# Patient Record
Sex: Female | Born: 1990 | Race: White | Hispanic: No | Marital: Single | Smoking: Current every day smoker
Health system: Southern US, Community
[De-identification: ages and names within clinical notes are randomized; demographics above are authoritative.]

## PROBLEM LIST (undated history)

## (undated) ENCOUNTER — Inpatient Hospital Stay (HOSPITAL_COMMUNITY): Payer: Self-pay

## (undated) DIAGNOSIS — R51 Headache: Secondary | ICD-10-CM

## (undated) DIAGNOSIS — N200 Calculus of kidney: Secondary | ICD-10-CM

## (undated) DIAGNOSIS — IMO0002 Reserved for concepts with insufficient information to code with codable children: Secondary | ICD-10-CM

## (undated) DIAGNOSIS — G5603 Carpal tunnel syndrome, bilateral upper limbs: Secondary | ICD-10-CM

## (undated) DIAGNOSIS — E669 Obesity, unspecified: Secondary | ICD-10-CM

## (undated) DIAGNOSIS — N83201 Unspecified ovarian cyst, right side: Secondary | ICD-10-CM

## (undated) DIAGNOSIS — Z3490 Encounter for supervision of normal pregnancy, unspecified, unspecified trimester: Secondary | ICD-10-CM

## (undated) DIAGNOSIS — N390 Urinary tract infection, site not specified: Secondary | ICD-10-CM

## (undated) DIAGNOSIS — K219 Gastro-esophageal reflux disease without esophagitis: Secondary | ICD-10-CM

## (undated) DIAGNOSIS — F32A Depression, unspecified: Secondary | ICD-10-CM

## (undated) DIAGNOSIS — I10 Essential (primary) hypertension: Secondary | ICD-10-CM

## (undated) DIAGNOSIS — F319 Bipolar disorder, unspecified: Secondary | ICD-10-CM

## (undated) DIAGNOSIS — R1031 Right lower quadrant pain: Secondary | ICD-10-CM

## (undated) DIAGNOSIS — R87619 Unspecified abnormal cytological findings in specimens from cervix uteri: Secondary | ICD-10-CM

## (undated) DIAGNOSIS — F329 Major depressive disorder, single episode, unspecified: Secondary | ICD-10-CM

## (undated) DIAGNOSIS — N949 Unspecified condition associated with female genital organs and menstrual cycle: Principal | ICD-10-CM

## (undated) DIAGNOSIS — O139 Gestational [pregnancy-induced] hypertension without significant proteinuria, unspecified trimester: Secondary | ICD-10-CM

## (undated) DIAGNOSIS — R110 Nausea: Secondary | ICD-10-CM

## (undated) DIAGNOSIS — N898 Other specified noninflammatory disorders of vagina: Secondary | ICD-10-CM

## (undated) DIAGNOSIS — N760 Acute vaginitis: Secondary | ICD-10-CM

## (undated) DIAGNOSIS — B9689 Other specified bacterial agents as the cause of diseases classified elsewhere: Secondary | ICD-10-CM

## (undated) HISTORY — DX: Reserved for concepts with insufficient information to code with codable children: IMO0002

## (undated) HISTORY — DX: Other specified noninflammatory disorders of vagina: N89.8

## (undated) HISTORY — DX: Obesity, unspecified: E66.9

## (undated) HISTORY — DX: Unspecified abnormal cytological findings in specimens from cervix uteri: R87.619

## (undated) HISTORY — PX: MYRINGOTOMY: SUR874

## (undated) HISTORY — PX: TONSILLECTOMY: SUR1361

## (undated) HISTORY — DX: Nausea: R11.0

## (undated) HISTORY — DX: Other specified bacterial agents as the cause of diseases classified elsewhere: B96.89

## (undated) HISTORY — DX: Right lower quadrant pain: R10.31

## (undated) HISTORY — PX: TONSILLECTOMY AND ADENOIDECTOMY: SHX28

## (undated) HISTORY — DX: Unspecified ovarian cyst, right side: N83.201

## (undated) HISTORY — DX: Acute vaginitis: N76.0

## (undated) HISTORY — DX: Unspecified condition associated with female genital organs and menstrual cycle: N94.9

---

## 2010-01-25 ENCOUNTER — Emergency Department (HOSPITAL_COMMUNITY): Admission: EM | Admit: 2010-01-25 | Discharge: 2010-01-25 | Payer: Self-pay | Admitting: Emergency Medicine

## 2010-07-20 ENCOUNTER — Emergency Department (HOSPITAL_COMMUNITY)
Admission: EM | Admit: 2010-07-20 | Discharge: 2010-07-20 | Payer: Self-pay | Source: Home / Self Care | Admitting: Emergency Medicine

## 2010-07-23 LAB — URINALYSIS, ROUTINE W REFLEX MICROSCOPIC
Bilirubin Urine: NEGATIVE
Hgb urine dipstick: NEGATIVE
Ketones, ur: NEGATIVE mg/dL
Nitrite: NEGATIVE
Protein, ur: NEGATIVE mg/dL
Specific Gravity, Urine: 1.015 (ref 1.005–1.030)
Urine Glucose, Fasting: NEGATIVE mg/dL
Urobilinogen, UA: 0.2 mg/dL (ref 0.0–1.0)
pH: 6 (ref 5.0–8.0)

## 2010-07-23 LAB — POCT PREGNANCY, URINE: Preg Test, Ur: NEGATIVE

## 2010-09-15 LAB — COMPREHENSIVE METABOLIC PANEL
ALT: 22 U/L (ref 0–35)
AST: 28 U/L (ref 0–37)
Albumin: 4.7 g/dL (ref 3.5–5.2)
Alkaline Phosphatase: 55 U/L (ref 39–117)
BUN: 8 mg/dL (ref 6–23)
CO2: 26 mEq/L (ref 19–32)
Calcium: 9.2 mg/dL (ref 8.4–10.5)
Chloride: 101 mEq/L (ref 96–112)
Creatinine, Ser: 0.77 mg/dL (ref 0.4–1.2)
GFR calc Af Amer: >60 mL/min (ref >60)
GFR calc non Af Amer: >60 mL/min (ref >60)
Glucose, Bld: 98 mg/dL (ref 70–99)
Potassium: 4.1 mEq/L (ref 3.5–5.1)
Sodium: 136 mEq/L (ref 135–145)
Total Bilirubin: 0.5 mg/dL (ref 0.3–1.2)
Total Protein: 7.4 g/dL (ref 6.0–8.3)

## 2010-09-15 LAB — CBC
HCT: 40.7 % (ref 36.0–46.0)
Hemoglobin: 14 g/dL (ref 12.0–15.0)
MCH: 33.5 pg (ref 26.0–34.0)
MCHC: 34.5 g/dL (ref 30.0–36.0)
MCV: 97.1 fL (ref 78.0–100.0)
Platelets: 256 10*3/uL (ref 150–400)
RBC: 4.19 MIL/uL (ref 3.87–5.11)
RDW: 13.2 % (ref 11.5–15.5)
WBC: 10.6 10*3/uL — ABNORMAL HIGH (ref 4.0–10.5)

## 2010-09-15 LAB — DIFFERENTIAL
Basophils Absolute: 0 10*3/uL (ref 0.0–0.1)
Basophils Relative: 0 % (ref 0–1)
Eosinophils Absolute: 0 10*3/uL (ref 0.0–0.7)
Eosinophils Relative: 0 % (ref 0–5)
Lymphocytes Relative: 11 % — ABNORMAL LOW (ref 12–46)
Lymphs Abs: 1.2 10*3/uL (ref 0.7–4.0)
Monocytes Absolute: 0.4 10*3/uL (ref 0.1–1.0)
Monocytes Relative: 4 % (ref 3–12)
Neutro Abs: 8.9 10*3/uL — ABNORMAL HIGH (ref 1.7–7.7)
Neutrophils Relative %: 84 % — ABNORMAL HIGH (ref 43–77)

## 2010-09-15 LAB — URINALYSIS, ROUTINE W REFLEX MICROSCOPIC
Bilirubin Urine: NEGATIVE
Glucose, UA: NEGATIVE mg/dL
Hgb urine dipstick: NEGATIVE
Ketones, ur: NEGATIVE mg/dL
Nitrite: NEGATIVE
Protein, ur: NEGATIVE mg/dL
Specific Gravity, Urine: 1.02 (ref 1.005–1.030)
Urobilinogen, UA: 0.2 mg/dL (ref 0.0–1.0)
pH: 7.5 (ref 5.0–8.0)

## 2010-09-15 LAB — POCT PREGNANCY, URINE: Preg Test, Ur: NEGATIVE

## 2010-09-16 ENCOUNTER — Emergency Department (HOSPITAL_COMMUNITY)
Admission: EM | Admit: 2010-09-16 | Discharge: 2010-09-16 | Disposition: A | Discharge status: Home / Self Care | Payer: Medicaid Other | Attending: Emergency Medicine | Admitting: Emergency Medicine

## 2010-09-16 DIAGNOSIS — R209 Unspecified disturbances of skin sensation: Secondary | ICD-10-CM | POA: Insufficient documentation

## 2010-09-16 DIAGNOSIS — G609 Hereditary and idiopathic neuropathy, unspecified: Secondary | ICD-10-CM | POA: Insufficient documentation

## 2010-09-16 LAB — GLUCOSE, CAPILLARY: Glucose-Capillary: 80 mg/dL (ref 70–99)

## 2010-10-19 ENCOUNTER — Emergency Department (HOSPITAL_COMMUNITY)
Admission: EM | Admit: 2010-10-19 | Discharge: 2010-10-19 | Disposition: A | Discharge status: Home / Self Care | Payer: Medicaid Other | Attending: Emergency Medicine | Admitting: Emergency Medicine

## 2010-10-19 DIAGNOSIS — N72 Inflammatory disease of cervix uteri: Secondary | ICD-10-CM | POA: Insufficient documentation

## 2010-10-19 DIAGNOSIS — R300 Dysuria: Secondary | ICD-10-CM | POA: Insufficient documentation

## 2010-10-19 LAB — URINALYSIS, ROUTINE W REFLEX MICROSCOPIC
Bilirubin Urine: NEGATIVE
Glucose, UA: NEGATIVE mg/dL
Hgb urine dipstick: NEGATIVE
Nitrite: NEGATIVE
Protein, ur: NEGATIVE mg/dL
Specific Gravity, Urine: 1.02 (ref 1.005–1.030)
Urobilinogen, UA: 0.2 mg/dL (ref 0.0–1.0)
pH: 6.5 (ref 5.0–8.0)

## 2010-10-19 LAB — WET PREP, GENITAL
Trich, Wet Prep: NONE SEEN
Yeast Wet Prep HPF POC: NONE SEEN

## 2010-10-19 LAB — POCT PREGNANCY, URINE: Preg Test, Ur: NEGATIVE

## 2010-10-21 LAB — URINE CULTURE
Colony Count: 7000
Culture  Setup Time: 201204211943

## 2010-10-22 LAB — GC/CHLAMYDIA PROBE AMP, GENITAL
Chlamydia, DNA Probe: NEGATIVE
GC Probe Amp, Genital: NEGATIVE

## 2010-11-13 ENCOUNTER — Emergency Department (HOSPITAL_COMMUNITY)
Admission: EM | Admit: 2010-11-13 | Discharge: 2010-11-13 | Disposition: A | Discharge status: Home / Self Care | Payer: Medicaid Other | Attending: Emergency Medicine | Admitting: Emergency Medicine

## 2010-11-13 DIAGNOSIS — K219 Gastro-esophageal reflux disease without esophagitis: Secondary | ICD-10-CM | POA: Insufficient documentation

## 2010-11-13 DIAGNOSIS — Z79899 Other long term (current) drug therapy: Secondary | ICD-10-CM | POA: Insufficient documentation

## 2010-11-13 DIAGNOSIS — F319 Bipolar disorder, unspecified: Secondary | ICD-10-CM | POA: Insufficient documentation

## 2010-11-13 DIAGNOSIS — H60399 Other infective otitis externa, unspecified ear: Secondary | ICD-10-CM | POA: Insufficient documentation

## 2011-01-18 ENCOUNTER — Emergency Department (HOSPITAL_COMMUNITY)
Admission: EM | Admit: 2011-01-18 | Discharge: 2011-01-18 | Disposition: A | Discharge status: Home / Self Care | Payer: Medicaid Other | Attending: Emergency Medicine | Admitting: Emergency Medicine

## 2011-01-18 DIAGNOSIS — IMO0001 Reserved for inherently not codable concepts without codable children: Secondary | ICD-10-CM | POA: Insufficient documentation

## 2011-01-18 DIAGNOSIS — N898 Other specified noninflammatory disorders of vagina: Secondary | ICD-10-CM | POA: Insufficient documentation

## 2011-01-18 DIAGNOSIS — R5383 Other fatigue: Secondary | ICD-10-CM | POA: Insufficient documentation

## 2011-01-18 DIAGNOSIS — R109 Unspecified abdominal pain: Secondary | ICD-10-CM | POA: Insufficient documentation

## 2011-01-18 DIAGNOSIS — R5381 Other malaise: Secondary | ICD-10-CM | POA: Insufficient documentation

## 2011-01-18 DIAGNOSIS — N899 Noninflammatory disorder of vagina, unspecified: Secondary | ICD-10-CM | POA: Insufficient documentation

## 2011-01-18 DIAGNOSIS — R300 Dysuria: Secondary | ICD-10-CM | POA: Insufficient documentation

## 2011-01-18 DIAGNOSIS — N949 Unspecified condition associated with female genital organs and menstrual cycle: Secondary | ICD-10-CM | POA: Insufficient documentation

## 2011-01-18 LAB — URINALYSIS, ROUTINE W REFLEX MICROSCOPIC
Bilirubin Urine: NEGATIVE
Glucose, UA: NEGATIVE mg/dL
Hgb urine dipstick: NEGATIVE
Ketones, ur: NEGATIVE mg/dL
Nitrite: NEGATIVE
Protein, ur: NEGATIVE mg/dL
Specific Gravity, Urine: 1.015 (ref 1.005–1.030)
Urobilinogen, UA: 0.2 mg/dL (ref 0.0–1.0)
pH: 6 (ref 5.0–8.0)

## 2011-01-18 LAB — URINE MICROSCOPIC-ADD ON

## 2011-01-18 LAB — WET PREP, GENITAL
Clue Cells Wet Prep HPF POC: NONE SEEN
Trich, Wet Prep: NONE SEEN
Yeast Wet Prep HPF POC: NONE SEEN

## 2011-01-18 LAB — PREGNANCY, URINE: Preg Test, Ur: NEGATIVE

## 2011-01-18 MED ORDER — HYDROCODONE-IBUPROFEN 5-200 MG PO TABS: 1.0000 | ORAL_TABLET | Freq: Four times a day (QID) | ORAL | Status: AC | PRN

## 2011-01-18 MED ORDER — HYDROCODONE-ACETAMINOPHEN 5-325 MG PO TABS: 1.0000 | ORAL_TABLET | Freq: Four times a day (QID) | ORAL | Status: DC | PRN

## 2011-01-18 MED ORDER — OXYCODONE-ACETAMINOPHEN 5-325 MG PO TABS: 1.0000 | ORAL_TABLET | ORAL | Status: DC | PRN

## 2011-01-18 MED ORDER — FLUCONAZOLE 100 MG PO TABS
150.0000 mg | ORAL_TABLET | Freq: Once | ORAL | Status: AC
  Administered 2011-01-18: 100 mg via ORAL
  Filled 2011-01-18: qty 1

## 2011-01-18 NOTE — ED Provider Notes (Signed)
History     Chief Complaint  Patient presents with  . Vaginal Itching   HPI Comments: Connie [name] is a 20 y.o. W/F that states she has vaginal itching, burning and has used OTC yeast cream without relief.  Current sex partner x 1 year. Last sexual intercourse 5 days ago. Symptoms started after shaving and after intercourse. Vaginal burning is on urination.    Patient is a 20 y.o. female presenting with vaginal itching. The history is provided by the patient.  Vaginal Itching This is a new problem. The current episode started in the past 7 days. The problem occurs constantly. The problem has been gradually worsening. Associated symptoms include chills, fatigue and myalgias. Pertinent negatives include no abdominal pain, fever, headaches, nausea, neck pain, rash, sore throat, swollen glands or vomiting. The symptoms are aggravated by nothing. The treatment provided moderate relief.    History reviewed. No pertinent past medical history.  History reviewed. No pertinent past surgical history.  History reviewed. No pertinent family history.  History  Substance Use Topics  . Smoking status: Never Smoker   . Smokeless tobacco: Not on file  . Alcohol Use: No    OB History    Grav Para Term Preterm Abortions TAB SAB Ect Mult Living   0               Review of Systems  Constitutional: Positive for chills and fatigue. Negative for fever.  HENT: Negative for sore throat and neck pain.   Eyes: Negative.   Respiratory: Negative.   Gastrointestinal: Negative for nausea, vomiting and abdominal pain.  Genitourinary: Positive for dysuria, frequency, flank pain, vaginal discharge and vaginal pain. Negative for vaginal bleeding and difficulty urinating.  Musculoskeletal: Positive for myalgias.  Skin: Negative for rash.  Neurological: Negative for headaches.  Psychiatric/Behavioral:       Bipolar    Physical Exam  BP 135/76  Pulse 90  Temp(Src) 98.6 F (37 C) (Oral)  Resp 24   Ht 4\' 11"  (1.499 m)  Wt 156 lb (70.761 kg)  BMI 31.51 kg/m2  SpO2 100%  LMP 12/19/2010  Physical Exam  Nursing note and vitals reviewed. Constitutional: She is oriented to person, place, and time. She appears well-developed and well-nourished.  HENT:  Head: Normocephalic and atraumatic.  Eyes: EOM are normal.  Neck: Neck supple.  Pulmonary/Chest: Effort normal.  Abdominal: Soft. There is no tenderness.  Genitourinary: Uterus normal.    Cervix exhibits discharge. Cervix exhibits no motion tenderness. Right adnexum displays no mass and no tenderness. Left adnexum displays no mass and no tenderness. Vaginal discharge found.       There is erythema and minimal edema of the clitoris and the labia. The vaginal discharge is white clumpy. There are no lesions visible.  Musculoskeletal: Normal range of motion.  Neurological: She is alert and oriented to person, place, and time. No cranial nerve deficit.  Skin: Skin is warm and dry.    ED Course  INCISION AND DRAINAGE Performed by: Kerrie Buffalo Authorized by: Lynelle Doctor, IVA L    MDM Connie [name] 19 y.o. with vaginal itching and burning.  Dx: vaginal irritation secondary to shaving. Monilia  Plan: Patient will continue to use yeast cream and we will give diflucan here today. If she develops and lesions she will return. Discussed slight possibility of HSV, but no lesions identified today.       [place], NP 01/18/11 1801  [place], NP 01/19/11 1205

## 2011-01-18 NOTE — ED Notes (Signed)
Pt presents with vaginal itching and pain. Pt states pain increases when she urinates. Pt states she has tried yeast infection medication.

## 2011-01-19 LAB — GC/CHLAMYDIA PROBE AMP, GENITAL
Chlamydia, DNA Probe: NEGATIVE
GC Probe Amp, Genital: NEGATIVE

## 2011-01-19 NOTE — ED Provider Notes (Signed)
History     Chief Complaint  Patient presents with  . Vaginal Itching   HPI  History reviewed. No pertinent past medical history.  History reviewed. No pertinent past surgical history.  History reviewed. No pertinent family history.  History  Substance Use Topics  . Smoking status: Never Smoker   . Smokeless tobacco: Not on file  . Alcohol Use: No    OB History    Grav Para Term Preterm Abortions TAB SAB Ect Mult Living   0               Review of Systems  Physical Exam  BP 135/76  Pulse 90  Temp(Src) 98.6 F (37 C) (Oral)  Resp 24  Ht 4\' 11"  (1.499 m)  Wt 156 lb (70.761 kg)  BMI 31.51 kg/m2  SpO2 100%  LMP 12/19/2010  Physical Exam  ED Course  Procedures  MDM  Medical screening examination/treatment/procedure(s) were performed by non-physician practitioner and as supervising physician I was immediately available for consultation/collaboration. Devoria Albe, MD, FACEP       [name], MD 01/19/11 [phone]

## 2011-01-19 NOTE — ED Provider Notes (Signed)
Medical screening examination/treatment/procedure(s) were performed by non-physician practitioner and as supervising physician I was immediately available for consultation/collaboration. Devoria Albe, MD, FACEP   [name], MD 01/19/11 [phone]

## 2011-02-19 ENCOUNTER — Other Ambulatory Visit: Payer: Self-pay | Admitting: Nurse Practitioner

## 2011-02-19 ENCOUNTER — Other Ambulatory Visit (HOSPITAL_COMMUNITY)
Admission: RE | Admit: 2011-02-19 | Discharge: 2011-02-19 | Disposition: A | Discharge status: Home / Self Care | Payer: Medicaid Other | Source: Ambulatory Visit | Attending: Nurse Practitioner | Admitting: Nurse Practitioner

## 2011-02-19 ENCOUNTER — Other Ambulatory Visit (HOSPITAL_COMMUNITY)
Admission: RE | Admit: 2011-02-19 | Discharge: 2011-02-19 | Disposition: A | Discharge status: Home / Self Care | Payer: Medicaid Other | Source: Ambulatory Visit | Attending: Family Medicine | Admitting: Family Medicine

## 2011-02-19 DIAGNOSIS — R87612 Low grade squamous intraepithelial lesion on cytologic smear of cervix (LGSIL): Secondary | ICD-10-CM | POA: Insufficient documentation

## 2011-02-19 DIAGNOSIS — N870 Mild cervical dysplasia: Secondary | ICD-10-CM | POA: Insufficient documentation

## 2011-03-27 ENCOUNTER — Encounter (HOSPITAL_COMMUNITY): Payer: Self-pay | Admitting: *Deleted

## 2011-03-27 ENCOUNTER — Emergency Department (HOSPITAL_COMMUNITY): Payer: Medicaid Other

## 2011-03-27 ENCOUNTER — Emergency Department (HOSPITAL_COMMUNITY)
Admission: EM | Admit: 2011-03-27 | Discharge: 2011-03-28 | Disposition: A | Discharge status: Home / Self Care | Payer: Medicaid Other | Attending: Emergency Medicine | Admitting: Emergency Medicine

## 2011-03-27 DIAGNOSIS — R0789 Other chest pain: Secondary | ICD-10-CM

## 2011-03-27 DIAGNOSIS — R079 Chest pain, unspecified: Secondary | ICD-10-CM | POA: Insufficient documentation

## 2011-03-27 DIAGNOSIS — F17200 Nicotine dependence, unspecified, uncomplicated: Secondary | ICD-10-CM | POA: Insufficient documentation

## 2011-03-27 HISTORY — DX: Depression, unspecified: F32.A

## 2011-03-27 HISTORY — DX: Major depressive disorder, single episode, unspecified: F32.9

## 2011-03-27 HISTORY — DX: Bipolar disorder, unspecified: F31.9

## 2011-03-27 HISTORY — DX: Gastro-esophageal reflux disease without esophagitis: K21.9

## 2011-03-27 LAB — COMPREHENSIVE METABOLIC PANEL
ALT: 11 U/L (ref 0–35)
AST: 14 U/L (ref 0–37)
Albumin: 4 g/dL (ref 3.5–5.2)
Alkaline Phosphatase: 54 U/L (ref 39–117)
BUN: 6 mg/dL (ref 6–23)
CO2: 27 mEq/L (ref 19–32)
Calcium: 9.1 mg/dL (ref 8.4–10.5)
Chloride: 100 mEq/L (ref 96–112)
Creatinine, Ser: 0.94 mg/dL (ref 0.50–1.10)
GFR calc Af Amer: >60 mL/min (ref >60)
GFR calc non Af Amer: >60 mL/min (ref >60)
Glucose, Bld: 88 mg/dL (ref 70–99)
Potassium: 4 mEq/L (ref 3.5–5.1)
Sodium: 136 mEq/L (ref 135–145)
Total Bilirubin: 0.1 mg/dL — ABNORMAL LOW (ref 0.3–1.2)
Total Protein: 6.6 g/dL (ref 6.0–8.3)

## 2011-03-27 LAB — DIFFERENTIAL
Basophils Absolute: 0 10*3/uL (ref 0.0–0.1)
Basophils Relative: 0 % (ref 0–1)
Eosinophils Absolute: 0.2 10*3/uL (ref 0.0–0.7)
Eosinophils Relative: 3 % (ref 0–5)
Lymphocytes Relative: 40 % (ref 12–46)
Lymphs Abs: 3.1 10*3/uL (ref 0.7–4.0)
Monocytes Absolute: 0.6 10*3/uL (ref 0.1–1.0)
Monocytes Relative: 8 % (ref 3–12)
Neutro Abs: 3.8 10*3/uL (ref 1.7–7.7)
Neutrophils Relative %: 49 % (ref 43–77)

## 2011-03-27 LAB — URINE MICROSCOPIC-ADD ON

## 2011-03-27 LAB — CBC
HCT: 37.8 % (ref 36.0–46.0)
Hemoglobin: 12.9 g/dL (ref 12.0–15.0)
MCH: 32.3 pg (ref 26.0–34.0)
MCHC: 34.1 g/dL (ref 30.0–36.0)
MCV: 94.5 fL (ref 78.0–100.0)
Platelets: 230 10*3/uL (ref 150–400)
RBC: 4 MIL/uL (ref 3.87–5.11)
RDW: 12.9 % (ref 11.5–15.5)
WBC: 7.7 10*3/uL (ref 4.0–10.5)

## 2011-03-27 LAB — URINALYSIS, ROUTINE W REFLEX MICROSCOPIC
Bilirubin Urine: NEGATIVE
Glucose, UA: NEGATIVE mg/dL
Hgb urine dipstick: NEGATIVE
Ketones, ur: NEGATIVE mg/dL
Nitrite: NEGATIVE
Protein, ur: NEGATIVE mg/dL
Specific Gravity, Urine: 1.015 (ref 1.005–1.030)
Urobilinogen, UA: 0.2 mg/dL (ref 0.0–1.0)
pH: 7 (ref 5.0–8.0)

## 2011-03-27 LAB — RAPID URINE DRUG SCREEN, HOSP PERFORMED
Amphetamines: NOT DETECTED
Barbiturates: NOT DETECTED
Benzodiazepines: POSITIVE — AB
Cocaine: NOT DETECTED
Opiates: NOT DETECTED
Tetrahydrocannabinol: NOT DETECTED

## 2011-03-27 LAB — PREGNANCY, URINE: Preg Test, Ur: NEGATIVE

## 2011-03-27 LAB — CARDIAC PANEL(CRET KIN+CKTOT+MB+TROPI)
CK, MB: 1.7 ng/mL (ref 0.3–4.0)
Relative Index: 1.4 (ref 0.0–2.5)
Total CK: 118 U/L (ref 7–177)
Troponin I: <0.3 ng/mL (ref <0.30)

## 2011-03-27 MED ORDER — TRAMADOL HCL 50 MG PO TABS: 50.0000 mg | ORAL_TABLET | Freq: Four times a day (QID) | ORAL | Status: AC | PRN

## 2011-03-27 MED ORDER — KETOROLAC TROMETHAMINE 60 MG/2ML IM SOLN
60.0000 mg | Freq: Once | INTRAMUSCULAR | Status: AC
  Administered 2011-03-27: 60 mg via INTRAMUSCULAR
  Filled 2011-03-27: qty 2

## 2011-03-27 NOTE — ED Notes (Signed)
Patient states she needs something for pain. RN Ron aware.

## 2011-03-27 NOTE — ED Notes (Signed)
shapr chest pains that started while watching tv.

## 2011-03-27 NOTE — ED Notes (Signed)
Pt requesting pain medication at this time. EDP notified. 

## 2011-03-27 NOTE — ED Notes (Addendum)
Pt reports watching when she had a sharp pain to the center of the chest. Pain is nonradiating. Medical illustrator. Gave pt 325 aspirin.

## 2011-03-27 NOTE — ED Provider Notes (Addendum)
History    Scribed for Geoffery Lyons, MD, the patient was seen in room APA10/APA10. This chart was scribed by Katha Cabal. This patient's care was started at 10:15 PM.     CSN: [id number] Arrival date & time: 03/27/2011  9:24 PM  Chief Complaint  Patient presents with  . Chest Pain    (Consider location/radiation/quality/duration/timing/severity/associated sxs/prior treatment) HPI  Connie [name] is a 20 y.o. female with GERD presents to the Emergency Department complaining of sharp sternal chest pain that began 2 hours ago.  Patient reports similar sx with acid reflux.  Pain is associated with SOB, and vomiting (x1).  Patient was given ASA upon arrival.  Patient states that she helped lift a "heavy lady" off the floor who fell where she lives.  Denies fever and radiation to arms.    PAST MEDICAL HISTORY:  Past Medical History  Diagnosis Date  . GERD (gastroesophageal reflux disease)   . Bipolar 1 disorder   . Depression     PAST SURGICAL HISTORY:  History reviewed. No pertinent past surgical history.  FAMILY HISTORY:  History reviewed. No pertinent family history.   SOCIAL HISTORY: History   Social History  . Marital Status: Single    Spouse Name: N/A    Number of Children: N/A  . Years of Education: N/A   Social History Main Topics  . Smoking status: Current Everyday Smoker -- 0.5 packs/day    Types: Cigarettes  . Smokeless tobacco: None  . Alcohol Use: No  . Drug Use: No  . Sexually Active: Yes    Birth Control/ Protection: None   Other Topics Concern  . None   Social History Narrative  . None    Review of Systems 10 Systems reviewed and are negative for acute change except as noted in the HPI.  Allergies  Review of patient's allergies indicates no known allergies.  Home Medications   Current Outpatient Rx  Name Route Sig Dispense Refill  . DIAZEPAM 5 MG PO TABS Oral Take 5 mg by mouth daily.      Marland Kitchen LAMOTRIGINE 25 MG PO TABS Oral Take 50 mg  by mouth at bedtime.      Carma Leaven M PLUS PO TABS Oral Take 1 tablet by mouth daily.      Marland Kitchen OMEPRAZOLE 20 MG PO CPDR Oral Take 20 mg by mouth daily.      Marland Kitchen OXCARBAZEPINE 300 MG PO TABS Oral Take 300 mg by mouth 2 (two) times daily.      Marland Kitchen RISPERIDONE 1 MG PO TABS Oral Take 1 mg by mouth at bedtime.      Marland Kitchen TRIMIPRAMINE MALEATE 50 MG PO CAPS Oral Take 150 mg by mouth at bedtime.        BP 132/77  Pulse 91  Temp(Src) 97.8 F (36.6 C) (Oral)  Resp 16  Ht 4\' 11"  (1.499 m)  Wt 165 lb (74.844 kg)  BMI 33.33 kg/m2  SpO2 100%  LMP 02/13/2011  Physical Exam  Nursing note and vitals reviewed. Constitutional: She is oriented to person, place, and time. She appears well-developed and well-nourished. No distress.  HENT:  Head: Normocephalic and atraumatic.  Eyes: EOM are normal. Pupils are equal, round, and reactive to light.  Neck: Normal range of motion. Neck supple.  Cardiovascular: Normal rate, regular rhythm and normal heart sounds.   No murmur heard. Pulmonary/Chest: Effort normal and breath sounds normal. No respiratory distress. She has no wheezes. She has no rales. She exhibits tenderness (  mild anterior sternal  ).  Abdominal: Soft. There is no tenderness. There is no rebound and no guarding.  Musculoskeletal: Normal range of motion. She exhibits no edema.  Neurological: She is alert and oriented to person, place, and time. No sensory deficit.  Skin: Skin is warm and dry. No rash noted. She is not diaphoretic.  Psychiatric: She has a normal mood and affect. Her behavior is normal.    ED Course  Procedures (including critical care time)  Labs Reviewed  COMPREHENSIVE METABOLIC PANEL - Abnormal; Notable for the following:    Total Bilirubin 0.1 (*)    All other components within normal limits  CBC  DIFFERENTIAL  CARDIAC PANEL(CRET KIN+CKTOT+MB+TROPI)  URINE RAPID DRUG SCREEN (HOSP PERFORMED)  PREGNANCY, URINE  URINALYSIS, ROUTINE W REFLEX MICROSCOPIC   OTHER DATA  REVIEWED: Nursing notes, vital signs, and past medical records reviewed.   DIAGNOSTIC STUDIES: Oxygen Saturation is 100% on room air, normal by my interpretation.       LABS / RADIOLOGY:  Results for orders placed during the hospital encounter of 03/27/11  CBC      Component Value Range   WBC 7.7  4.0 - 10.5 (K/uL)   RBC 4.00  3.87 - 5.11 (MIL/uL)   Hemoglobin 12.9  12.0 - 15.0 (g/dL)   HCT 16.1  09.6 - 04.5 (%)   MCV 94.5  78.0 - 100.0 (fL)   MCH 32.3  26.0 - 34.0 (pg)   MCHC 34.1  30.0 - 36.0 (g/dL)   RDW 40.9  81.1 - 91.4 (%)   Platelets 230  150 - 400 (K/uL)  DIFFERENTIAL      Component Value Range   Neutrophils Relative 49  43 - 77 (%)   Neutro Abs 3.8  1.7 - 7.7 (K/uL)   Lymphocytes Relative 40  12 - 46 (%)   Lymphs Abs 3.1  0.7 - 4.0 (K/uL)   Monocytes Relative 8  3 - 12 (%)   Monocytes Absolute 0.6  0.1 - 1.0 (K/uL)   Eosinophils Relative 3  0 - 5 (%)   Eosinophils Absolute 0.2  0.0 - 0.7 (K/uL)   Basophils Relative 0  0 - 1 (%)   Basophils Absolute 0.0  0.0 - 0.1 (K/uL)  COMPREHENSIVE METABOLIC PANEL      Component Value Range   Sodium 136  135 - 145 (mEq/L)   Potassium 4.0  3.5 - 5.1 (mEq/L)   Chloride 100  96 - 112 (mEq/L)   CO2 27  19 - 32 (mEq/L)   Glucose, Bld 88  70 - 99 (mg/dL)   BUN 6  6 - 23 (mg/dL)   Creatinine, Ser 7.82  0.50 - 1.10 (mg/dL)   Calcium 9.1  8.4 - 95.6 (mg/dL)   Total Protein 6.6  6.0 - 8.3 (g/dL)   Albumin 4.0  3.5 - 5.2 (g/dL)   AST 14  0 - 37 (U/L)   ALT 11  0 - 35 (U/L)   Alkaline Phosphatase 54  39 - 117 (U/L)   Total Bilirubin 0.1 (*) 0.3 - 1.2 (mg/dL)   GFR calc non Af Amer >60  >60 (mL/min)   GFR calc Af Amer >60  >60 (mL/min)  CARDIAC PANEL(CRET KIN+CKTOT+MB+TROPI)      Component Value Range   Total CK 118  7 - 177 (U/L)   CK, MB 1.7  0.3 - 4.0 (ng/mL)   Troponin I <0.30  <0.30 (ng/mL)   Relative Index 1.4  0.0 - 2.5  No results found.    ED COURSE / COORDINATION OF CARE: 10:22 PM  Physical exam  complete.  Will order and review labs, EKG and CXR.    Orders Placed This Encounter  Procedures  . DG Chest Portable 1 View  . CBC  . Differential  . Comprehensive metabolic panel  . Cardiac panel(cret kin+cktot+mb+tropi)  . Drug screen panel, emergency  . Pregnancy, urine  . Urinalysis with microscopic  . ED EKG         MDM   MDM: SR 91, no changes.  Symptoms are atypical.  Labs all okay.  Will discharge and follow up with pcp prn.   IMPRESSION: Diagnoses that have been ruled out:  Diagnoses that are still under consideration:  Final diagnoses:     MEDICATIONS GIVEN IN THE E.D. Scheduled Meds:   Continuous Infusions:      DISCHARGE MEDICATIONS: New Prescriptions   No medications on file     I personally performed the services described in this documentation, which was scribed in my presence. The recorded information has been reviewed and considered. Geoffery Lyons, MD           Geoffery Lyons, MD 03/27/11 2340  Geoffery Lyons, MD 03/29/11 [phone]

## 2011-03-28 ENCOUNTER — Ambulatory Visit (HOSPITAL_COMMUNITY): Admission: RE | Admit: 2011-03-28 | Payer: Medicaid Other | Source: Ambulatory Visit

## 2011-06-30 ENCOUNTER — Emergency Department (HOSPITAL_COMMUNITY)
Admission: EM | Admit: 2011-06-30 | Discharge: 2011-06-30 | Disposition: A | Discharge status: Home / Self Care | Payer: Medicaid Other | Attending: Emergency Medicine | Admitting: Emergency Medicine

## 2011-06-30 ENCOUNTER — Encounter (HOSPITAL_COMMUNITY): Payer: Self-pay

## 2011-06-30 DIAGNOSIS — L02419 Cutaneous abscess of limb, unspecified: Secondary | ICD-10-CM | POA: Insufficient documentation

## 2011-06-30 DIAGNOSIS — F17200 Nicotine dependence, unspecified, uncomplicated: Secondary | ICD-10-CM | POA: Insufficient documentation

## 2011-06-30 DIAGNOSIS — L0291 Cutaneous abscess, unspecified: Secondary | ICD-10-CM

## 2011-06-30 DIAGNOSIS — K219 Gastro-esophageal reflux disease without esophagitis: Secondary | ICD-10-CM | POA: Insufficient documentation

## 2011-06-30 DIAGNOSIS — F319 Bipolar disorder, unspecified: Secondary | ICD-10-CM | POA: Insufficient documentation

## 2011-06-30 MED ORDER — DOXYCYCLINE HYCLATE 100 MG PO TABS
100.0000 mg | ORAL_TABLET | Freq: Once | ORAL | Status: AC
  Administered 2011-06-30: 100 mg via ORAL
  Filled 2011-06-30: qty 1

## 2011-06-30 MED ORDER — LIDOCAINE HCL (PF) 1 % IJ SOLN
5.0000 mL | Freq: Once | INTRAMUSCULAR | Status: AC
  Administered 2011-06-30: 5 mL
  Filled 2011-06-30: qty 5

## 2011-06-30 MED ORDER — DOXYCYCLINE HYCLATE 100 MG PO CAPS: 100.0000 mg | ORAL_CAPSULE | Freq: Two times a day (BID) | ORAL | Status: AC

## 2011-06-30 NOTE — ED Provider Notes (Signed)
History     CSN: [id number]  Arrival date & time 06/30/11  1124   First MD Initiated Contact with Patient 06/30/11 1336      Chief Complaint  Patient presents with  . Abscess    (Consider location/radiation/quality/duration/timing/severity/associated sxs/prior treatment) Patient is a 20 y.o. female presenting with abscess. The history is provided by the patient.  Abscess  This is a recurrent problem. The current episode started more than one week ago. The onset was gradual. The problem occurs occasionally. The problem has been gradually worsening. Affected Location: left thigh. The problem is mild. The abscess is characterized by painfulness and swelling. It is unknown what she was exposed to. The abscess first occurred at home. Pertinent negatives include no fever and no vomiting. Her past medical history does not include skin abscesses in family. There were no sick contacts. She has received no recent medical care.    Past Medical History  Diagnosis Date  . GERD (gastroesophageal reflux disease)   . Bipolar 1 disorder   . Depression     Past Surgical History  Procedure Date  . Tonsillectomy     No family history on file.  History  Substance Use Topics  . Smoking status: Current Everyday Smoker -- 0.5 packs/day    Types: Cigarettes  . Smokeless tobacco: Not on file  . Alcohol Use: No    OB History    Grav Para Term Preterm Abortions TAB SAB Ect Mult Living   0               Review of Systems  Constitutional: Negative for fever and chills.  Gastrointestinal: Negative for vomiting and abdominal pain.  Genitourinary: Negative for vaginal pain.  Musculoskeletal: Negative for myalgias, back pain and arthralgias.  Skin: Negative for rash.       abscess  Neurological: Negative for dizziness, weakness and numbness.  Hematological: Negative for adenopathy. Does not bruise/bleed easily.    Allergies  Review of patient's allergies indicates no known  allergies.  Home Medications   Current Outpatient Rx  Name Route Sig Dispense Refill  . DIAZEPAM 5 MG PO TABS Oral Take 5 mg by mouth daily.      Marland Kitchen LAMOTRIGINE 25 MG PO TABS Oral Take 50 mg by mouth at bedtime.      Carma Leaven M PLUS PO TABS Oral Take 1 tablet by mouth daily.      Marland Kitchen OMEPRAZOLE 20 MG PO CPDR Oral Take 20 mg by mouth daily.      Marland Kitchen OXCARBAZEPINE 300 MG PO TABS Oral Take 300 mg by mouth 2 (two) times daily.      Marland Kitchen RISPERIDONE 1 MG PO TABS Oral Take 1 mg by mouth at bedtime.      Marland Kitchen TRIMIPRAMINE MALEATE 50 MG PO CAPS Oral Take 150 mg by mouth at bedtime.        BP 129/74  Pulse 88  Temp(Src) 97.9 F (36.6 C) (Oral)  Resp 18  Ht 5\' 1"  (1.549 m)  Wt 174 lb (78.926 kg)  BMI 32.88 kg/m2  SpO2 100%  LMP 06/03/2011  Physical Exam  Nursing note and vitals reviewed. Constitutional: She is oriented to person, place, and time. She appears well-developed and well-nourished. No distress.  HENT:  Head: Normocephalic and atraumatic.  Cardiovascular: Normal rate, regular rhythm and normal heart sounds.   Pulmonary/Chest: Effort normal and breath sounds normal.  Abdominal: Soft. She exhibits no distension. There is no tenderness.  Musculoskeletal: Normal range of motion.  She exhibits no tenderness.  Neurological: She is alert and oriented to person, place, and time. She exhibits normal muscle tone. Coordination normal.  Skin: Skin is warm and dry. Lesion noted.       2 cm fluctuant abscess to the medial left thigh.  No drainage, induration or surrounding erythema    ED Course  Procedures (including critical care time)  INCISION AND DRAINAGE Performed by: Maxwell Caul. Consent: Verbal consent obtained. Risks and benefits: risks, benefits and alternatives were discussed Type: abscess  Body area: left medial thigh Anesthesia: local infiltration  Local anesthetic: lidocaine 1% w/o epinephrine  Anesthetic total: 2 ml  Complexity: complex Blunt dissection to break up  loculations  Drainage: purulent  Drainage amount: small Packing material: 1/4 in iodoform gauze  Patient tolerance: Patient tolerated the procedure well with no immediate complications.        MDM     Patient agrees to return here in 2 days from I&D date to have packing removed and recheck     Jeni Duling L. [place], [place] 07/01/11 2021

## 2011-06-30 NOTE — ED Notes (Signed)
Pt a/ox4. Resp even and unlabored. NAD at this time. D/C instructions reviewed with pt. Pt verbalized understanding. Pt ambulated to lobby with steady gate.  

## 2011-06-30 NOTE — ED Notes (Signed)
Pt presents with abscess to left inner thigh. Pt denies drainage. Abscess appeared approx 7 days ago.

## 2011-07-02 NOTE — L&D Delivery Note (Signed)
Connie [name] is a 21 y.o. G1P0000 at [redacted]w[redacted]d who presented for induction of labor for post-dates and elevated blood pressures. She was induced with pitocin with normal labor progression.  Delivery Note At 11:44 PM a viable female was delivered via  (Presentation: vertex;OA).  APGAR: 8, 9; weight 6lb 13.5 oz.   Placenta status: spontaneous, intact.  Cord: nuchal x 1, delivered through, 3-vessel; with the following complications: none.  Cord pH: n/a.  Anesthesia: Epidural  Episiotomy:  Lacerations: none Suture Repair: n/a Est. Blood Loss (mL): 300  Mom to postpartum.  Baby to nursery-stable.  Napoleon Form 06/30/2012, 11:58 PM

## 2011-07-03 NOTE — ED Provider Notes (Signed)
Medical screening examination/treatment/procedure(s) were performed by non-physician practitioner and as supervising physician I was immediately available for consultation/collaboration.  [place]old Mom S. Terrion Gencarelli, MD 07/03/11 1329 

## 2011-10-14 ENCOUNTER — Emergency Department: Payer: Self-pay | Admitting: *Deleted

## 2011-10-14 LAB — URINALYSIS, COMPLETE
Bilirubin,UR: NEGATIVE
Glucose,UR: NEGATIVE mg/dL (ref 0–75)
Ketone: NEGATIVE
Nitrite: NEGATIVE
Ph: 7 (ref 4.5–8.0)
Protein: NEGATIVE
RBC,UR: 5 /HPF (ref 0–5)
Specific Gravity: 1.013 (ref 1.003–1.030)
Squamous Epithelial: 17
WBC UR: 22 /HPF (ref 0–5)

## 2011-10-14 LAB — PREGNANCY, URINE: Pregnancy Test, Urine: POSITIVE m[IU]/mL

## 2011-10-14 LAB — WET PREP, GENITAL

## 2011-10-14 LAB — HCG, QUANTITATIVE, PREGNANCY: Beta Hcg, Quant.: 162 m[IU]/mL — ABNORMAL HIGH

## 2012-01-03 ENCOUNTER — Encounter (HOSPITAL_COMMUNITY): Payer: Self-pay | Admitting: *Deleted

## 2012-01-03 ENCOUNTER — Emergency Department (HOSPITAL_COMMUNITY): Payer: Medicaid Other

## 2012-01-03 ENCOUNTER — Emergency Department (HOSPITAL_COMMUNITY)
Admission: EM | Admit: 2012-01-03 | Discharge: 2012-01-03 | Disposition: A | Discharge status: Home / Self Care | Payer: Medicaid Other | Attending: Emergency Medicine | Admitting: Emergency Medicine

## 2012-01-03 ENCOUNTER — Emergency Department: Payer: Self-pay | Admitting: Emergency Medicine

## 2012-01-03 DIAGNOSIS — R109 Unspecified abdominal pain: Secondary | ICD-10-CM | POA: Insufficient documentation

## 2012-01-03 DIAGNOSIS — M545 Low back pain, unspecified: Secondary | ICD-10-CM | POA: Insufficient documentation

## 2012-01-03 DIAGNOSIS — K219 Gastro-esophageal reflux disease without esophagitis: Secondary | ICD-10-CM | POA: Insufficient documentation

## 2012-01-03 DIAGNOSIS — Z79899 Other long term (current) drug therapy: Secondary | ICD-10-CM | POA: Insufficient documentation

## 2012-01-03 DIAGNOSIS — F3130 Bipolar disorder, current episode depressed, mild or moderate severity, unspecified: Secondary | ICD-10-CM | POA: Insufficient documentation

## 2012-01-03 DIAGNOSIS — O9989 Other specified diseases and conditions complicating pregnancy, childbirth and the puerperium: Secondary | ICD-10-CM | POA: Insufficient documentation

## 2012-01-03 DIAGNOSIS — R21 Rash and other nonspecific skin eruption: Secondary | ICD-10-CM | POA: Insufficient documentation

## 2012-01-03 DIAGNOSIS — R630 Anorexia: Secondary | ICD-10-CM | POA: Insufficient documentation

## 2012-01-03 DIAGNOSIS — O26899 Other specified pregnancy related conditions, unspecified trimester: Secondary | ICD-10-CM

## 2012-01-03 DIAGNOSIS — E669 Obesity, unspecified: Secondary | ICD-10-CM | POA: Insufficient documentation

## 2012-01-03 LAB — CBC WITH DIFFERENTIAL/PLATELET
Basophils Absolute: 0 10*3/uL (ref 0.0–0.1)
Basophils Relative: 0 % (ref 0–1)
Eosinophils Absolute: 0.1 10*3/uL (ref 0.0–0.7)
Eosinophils Relative: 1 % (ref 0–5)
HCT: 35.8 % — ABNORMAL LOW (ref 36.0–46.0)
Hemoglobin: 12.2 g/dL (ref 12.0–15.0)
Lymphocytes Relative: 13 % (ref 12–46)
Lymphs Abs: 1.7 10*3/uL (ref 0.7–4.0)
MCH: 31.7 pg (ref 26.0–34.0)
MCHC: 34.1 g/dL (ref 30.0–36.0)
MCV: 93 fL (ref 78.0–100.0)
Monocytes Absolute: 0.4 10*3/uL (ref 0.1–1.0)
Monocytes Relative: 3 % (ref 3–12)
Neutro Abs: 11 10*3/uL — ABNORMAL HIGH (ref 1.7–7.7)
Neutrophils Relative %: 83 % — ABNORMAL HIGH (ref 43–77)
Platelets: 213 10*3/uL (ref 150–400)
RBC: 3.85 MIL/uL — ABNORMAL LOW (ref 3.87–5.11)
RDW: 13.3 % (ref 11.5–15.5)
WBC: 13.2 10*3/uL — ABNORMAL HIGH (ref 4.0–10.5)

## 2012-01-03 LAB — URINALYSIS, ROUTINE W REFLEX MICROSCOPIC
Bilirubin Urine: NEGATIVE
Glucose, UA: NEGATIVE mg/dL
Hgb urine dipstick: NEGATIVE
Ketones, ur: NEGATIVE mg/dL
Leukocytes, UA: NEGATIVE
Nitrite: NEGATIVE
Protein, ur: NEGATIVE mg/dL
Specific Gravity, Urine: 1.02 (ref 1.005–1.030)
Urobilinogen, UA: 0.2 mg/dL (ref 0.0–1.0)
pH: 6 (ref 5.0–8.0)

## 2012-01-03 LAB — BASIC METABOLIC PANEL
BUN: 3 mg/dL — ABNORMAL LOW (ref 6–23)
CO2: 20 mEq/L (ref 19–32)
Calcium: 9.4 mg/dL (ref 8.4–10.5)
Chloride: 103 mEq/L (ref 96–112)
Creatinine, Ser: 0.52 mg/dL (ref 0.50–1.10)
GFR calc Af Amer: >90 mL/min (ref >90)
GFR calc non Af Amer: >90 mL/min (ref >90)
Glucose, Bld: 116 mg/dL — ABNORMAL HIGH (ref 70–99)
Potassium: 3.5 mEq/L (ref 3.5–5.1)
Sodium: 134 mEq/L — ABNORMAL LOW (ref 135–145)

## 2012-01-03 LAB — ABO/RH: ABO/RH(D): A POS

## 2012-01-03 LAB — HCG, QUANTITATIVE, PREGNANCY: hCG, Beta Chain, Quant, S: 18712 m[IU]/mL — ABNORMAL HIGH (ref <5)

## 2012-01-03 LAB — WET PREP, GENITAL
Clue Cells Wet Prep HPF POC: NONE SEEN
Trich, Wet Prep: NONE SEEN
Yeast Wet Prep HPF POC: NONE SEEN

## 2012-01-03 LAB — PREGNANCY, URINE: Preg Test, Ur: POSITIVE — AB

## 2012-01-03 MED ORDER — SODIUM CHLORIDE 0.9 % IV BOLUS (SEPSIS)
1000.0000 mL | Freq: Once | INTRAVENOUS | Status: AC
  Administered 2012-01-03: 1000 mL via INTRAVENOUS

## 2012-01-03 MED ORDER — DIPHENHYDRAMINE HCL 25 MG PO CAPS
25.0000 mg | ORAL_CAPSULE | Freq: Once | ORAL | Status: AC
  Administered 2012-01-03: 25 mg via ORAL
  Filled 2012-01-03: qty 1

## 2012-01-03 MED ORDER — ACETAMINOPHEN 325 MG PO TABS
650.0000 mg | ORAL_TABLET | Freq: Once | ORAL | Status: AC
  Administered 2012-01-03: 650 mg via ORAL
  Filled 2012-01-03: qty 2

## 2012-01-03 NOTE — ED Notes (Signed)
Pt states she is [redacted] weeks pregnant and began having lower abdominal cramping this morning. Pt states rash all over, "feels like I'm on fire all over" denies vaginal bleeding.

## 2012-01-03 NOTE — ED Provider Notes (Signed)
History  This chart was scribed for Connie Octave, MD by [name]. This patient was seen in room APA18/APA18 and the patient's care was started at 1256.   CSN: [id number]  Arrival date & time 01/03/12  1238   First MD Initiated Contact with Patient 01/03/12 1256      Chief Complaint  Patient presents with  . Abdominal Pain    pregnant  . Rash    The history is provided by the patient. No language interpreter was used.    Connie [name] is a 21 y.o. female who presents to the Emergency Department complaining of intermittent, diffuse abdominal cramping radiating to her lower back that started this morning. She reports similar but milder proir episodes. She is currently 15 weeks into her pregnancy and denies any complications. She reports that her last ultrasound was on June 13th, 2013 which was normal. Her LNMP was sometime in March. She also c/o decreased appetite with less frequent and smaller BMs and a sudden onset, constant rash described as a burning sensation on her face and chest since this morning. She denies having any contact with new soaps, foods, detergents or sick contacts with similar symptoms. She denies taking OTC medications at home to improve symptoms. She denies having any modifying factors. She denies vaginal bleeding, CP, SOB, and emesis as associated symptoms. She has a h/o GERD, bipolar disorder and depression. She is a current everyday smoker but denies alcohol use.  Dr. Emelda Fear is Obstetrician.   Past Medical History  Diagnosis Date  . GERD (gastroesophageal reflux disease)   . Bipolar 1 disorder   . Depression     Past Surgical History  Procedure Date  . Tonsillectomy     No family history on file.  History  Substance Use Topics  . Smoking status: Current Everyday Smoker -- 0.5 packs/day    Types: Cigarettes  . Smokeless tobacco: Not on file  . Alcohol Use: No    OB History    Grav Para Term Preterm Abortions TAB SAB Ect Mult Living   0                Review of Systems  A complete 10 system review of systems was obtained and all systems are negative except as noted in the HPI and PMH.   Allergies  Review of patient's allergies indicates no known allergies.  Home Medications   Current Outpatient Rx  Name Route Sig Dispense Refill  . DIAZEPAM 5 MG PO TABS Oral Take 5 mg by mouth daily.      Marland Kitchen LAMOTRIGINE 25 MG PO TABS Oral Take 50 mg by mouth at bedtime.      Carma Leaven M PLUS PO TABS Oral Take 1 tablet by mouth daily.      Marland Kitchen OMEPRAZOLE 20 MG PO CPDR Oral Take 20 mg by mouth daily.      Marland Kitchen OXCARBAZEPINE 300 MG PO TABS Oral Take 300 mg by mouth 2 (two) times daily.      Marland Kitchen RISPERIDONE 1 MG PO TABS Oral Take 1 mg by mouth at bedtime.      Marland Kitchen TRIMIPRAMINE MALEATE 50 MG PO CAPS Oral Take 150 mg by mouth at bedtime.        Triage Vitals: BP 127/67  Pulse 107  Temp 98.2 F (36.8 C) (Oral)  Resp 16  SpO2 100%  LMP 09/05/2011  Physical Exam  Nursing note and vitals reviewed. Constitutional: She is oriented to person, place, and time. She  appears well-developed and well-nourished. No distress.       Obese   HENT:  Head: Normocephalic and atraumatic.  Eyes: EOM are normal.  Neck: Neck supple. No tracheal deviation present.  Cardiovascular: Normal rate, regular rhythm and normal heart sounds.   Pulmonary/Chest: Effort normal and breath sounds normal. No respiratory distress.  Abdominal: Soft. There is tenderness. There is guarding.       mild lower abdominal tenderness that is worse in suprapubic region with voluntary guarding    Genitourinary: There is no tenderness on the right labia. There is no tenderness on the left labia. Cervix exhibits no motion tenderness. Right adnexum displays no mass and no tenderness. Left adnexum displays no mass and no tenderness. No vaginal discharge found.  Musculoskeletal: Normal range of motion. She exhibits tenderness.       paraspinal lower back pain   Neurological: She is alert and  oriented to person, place, and time.  Skin: Skin is warm and dry.  Psychiatric: She has a normal mood and affect. Her behavior is normal.    ED Course  Procedures (including critical care time)  DIAGNOSTIC STUDIES: Oxygen Saturation is 100% on room air, normal by my interpretation.    COORDINATION OF CARE: 1:15PM-Discussed treatment plan which includes a urinalysis and a pelvic exam with pt and pt agreed to plan. 2:40PM-Discussed pt's case with Dr. Macon Large. Call ended at 2:42PM.   Labs Reviewed  URINALYSIS, ROUTINE W REFLEX MICROSCOPIC - Abnormal; Notable for the following:    APPearance HAZY (*)     All other components within normal limits  PREGNANCY, URINE - Abnormal; Notable for the following:    Preg Test, Ur POSITIVE (*)     All other components within normal limits  WET PREP, GENITAL - Abnormal; Notable for the following:    WBC, Wet Prep HPF POC MANY (*)     All other components within normal limits  CBC WITH DIFFERENTIAL - Abnormal; Notable for the following:    WBC 13.2 (*)     RBC 3.85 (*)     HCT 35.8 (*)     Neutrophils Relative 83 (*)     Neutro Abs 11.0 (*)     All other components within normal limits  BASIC METABOLIC PANEL - Abnormal; Notable for the following:    Sodium 134 (*)     Glucose, Bld 116 (*)     BUN 3 (*)     All other components within normal limits  HCG, QUANTITATIVE, PREGNANCY - Abnormal; Notable for the following:    hCG, Beta Chain, Quant, S 16109 (*)     All other components within normal limits  ABO/RH  GC/CHLAMYDIA PROBE AMP, GENITAL   US Ob Comp + 14 Wk  01/03/2012  *RADIOLOGY REPORT*  Clinical Data: Mid pelvic pain  LIMITED OBSTETRIC ULTRASOUND  Number of Fetuses: 1 Heart Rate: 155 bpm Movement: Seen Presentation: Breech Placental Location: Posterior Previa: No Amniotic Fluid (Subjective): Normal  Vertical pocket:  2.1cm AFI:  cm (5%ile  cm, 95%ile  cm)  BPD: 3.4cm   16w   3d   EDC: 06/16/2012  MATERNAL FINDINGS: Cervix: 3.2 cm,  measured transabdominally Uterus/Adnexae: The ovaries are not visualized.  No pelvic fluid is seen  IMPRESSION: Single living intrauterine pregnancy demonstrated showing an estimated gestational age by BPD alone of 16 weeks 3 days.  Subjectively normal amniotic fluid volume.  Long closed cervix.  Recommend followup with non-emergent complete OB 14+ wk US examination for fetal  biometric evaluation and anatomic survey if not already performed.  Original Report Authenticated By: Bertha Stakes, M.D.     1. Abdominal pain in pregnancy       MDM  15 weeks pregnancy G1P0 confirmed by [place], presenting with lower abdominal cramping x 1 day.  No vomiting, fever, vaginal bleeding.  IUP on [place] with good heart rate.  Pelvic exam benign. D/w OB Dr. Macon Large for Dr. Emelda Fear.  Agrees with followup this week.  I personally performed the services described in this documentation, which was scribed in my presence.  The recorded information has been reviewed and considered.         Connie Octave, MD 01/03/12 1910

## 2012-01-04 LAB — GC/CHLAMYDIA PROBE AMP, GENITAL
Chlamydia, DNA Probe: NEGATIVE
GC Probe Amp, Genital: NEGATIVE

## 2012-01-05 ENCOUNTER — Emergency Department: Payer: Self-pay | Admitting: Internal Medicine

## 2012-03-05 ENCOUNTER — Other Ambulatory Visit (HOSPITAL_COMMUNITY)
Admission: RE | Admit: 2012-03-05 | Discharge: 2012-03-05 | Disposition: A | Discharge status: Home / Self Care | Payer: Medicaid Other | Source: Ambulatory Visit | Attending: Obstetrics and Gynecology | Admitting: Obstetrics and Gynecology

## 2012-03-05 ENCOUNTER — Other Ambulatory Visit: Payer: Self-pay | Admitting: Family Medicine

## 2012-03-05 DIAGNOSIS — Z01419 Encounter for gynecological examination (general) (routine) without abnormal findings: Secondary | ICD-10-CM | POA: Insufficient documentation

## 2012-03-11 ENCOUNTER — Observation Stay: Payer: Self-pay

## 2012-03-11 LAB — URINALYSIS, COMPLETE
Bilirubin,UR: NEGATIVE
Blood: NEGATIVE
Glucose,UR: NEGATIVE mg/dL (ref 0–75)
Ketone: NEGATIVE
Nitrite: NEGATIVE
Ph: 7 (ref 4.5–8.0)
Protein: NEGATIVE
RBC,UR: 2 /HPF (ref 0–5)
Specific Gravity: 1.017 (ref 1.003–1.030)
Squamous Epithelial: 15
WBC UR: 22 /HPF (ref 0–5)

## 2012-03-13 LAB — URINE CULTURE

## 2012-04-11 ENCOUNTER — Encounter (HOSPITAL_COMMUNITY): Payer: Self-pay | Admitting: *Deleted

## 2012-04-11 ENCOUNTER — Emergency Department (HOSPITAL_COMMUNITY)
Admission: EM | Admit: 2012-04-11 | Discharge: 2012-04-12 | Disposition: A | Discharge status: Home / Self Care | Payer: Medicare Other | Attending: Emergency Medicine | Admitting: Emergency Medicine

## 2012-04-11 DIAGNOSIS — F419 Anxiety disorder, unspecified: Secondary | ICD-10-CM

## 2012-04-11 DIAGNOSIS — F410 Panic disorder [episodic paroxysmal anxiety] without agoraphobia: Secondary | ICD-10-CM | POA: Insufficient documentation

## 2012-04-11 DIAGNOSIS — F17200 Nicotine dependence, unspecified, uncomplicated: Secondary | ICD-10-CM | POA: Insufficient documentation

## 2012-04-11 DIAGNOSIS — F319 Bipolar disorder, unspecified: Secondary | ICD-10-CM | POA: Insufficient documentation

## 2012-04-11 DIAGNOSIS — F411 Generalized anxiety disorder: Secondary | ICD-10-CM | POA: Insufficient documentation

## 2012-04-11 DIAGNOSIS — K219 Gastro-esophageal reflux disease without esophagitis: Secondary | ICD-10-CM | POA: Insufficient documentation

## 2012-04-11 DIAGNOSIS — Z79899 Other long term (current) drug therapy: Secondary | ICD-10-CM | POA: Insufficient documentation

## 2012-04-11 NOTE — ED Notes (Signed)
Pt is 7 months pregnant, thinks she may be having a panic attack, hx of panic attacks and has been off meds due to pregnancy, EDD 06/23/12, last prenatal visit a week ago which is normal per pt

## 2012-04-11 NOTE — ED Provider Notes (Signed)
History   This chart was scribed for [employer]. Colon Branch, MD scribed by Magnus Sinning. The patient was seen in room APA16A/APA16A at 23:50   CSN: [id number]  Arrival date & time 04/11/12  2205    Chief Complaint  Patient presents with  . Panic Attack    (Consider location/radiation/quality/duration/timing/severity/associated sxs/prior treatment) HPI Connie [name] is a 15-months pregnant 21 y.o. female who presents to the Emergency Department complaining of a once-occuring panic attack that occurred this evening with associated crying, and inability to sleep. Patient states hx of panic attacks, but notes it has not been to similar severity. She states stress and people gossiping triggers panic attacks. Denies SI, but says she has previously talked about desires to die.She says she does take medication for treatment of panic attacks, but she is unsure of prescriptions.   Psychiatrist:Dr. Demaris Callander at [facility] with her next appt on 04/13/12 Ob-Gyn: Dr. Emelda Fear  Past Medical History  Diagnosis Date  . GERD (gastroesophageal reflux disease)   . Bipolar 1 disorder   . Depression   . Panic attack     Past Surgical History  Procedure Date  . Tonsillectomy     No family history on file.  History  Substance Use Topics  . Smoking status: Current Every Day Smoker -- 0.5 packs/day    Types: Cigarettes  . Smokeless tobacco: Not on file  . Alcohol Use: No    OB History    Grav Para Term Preterm Abortions TAB SAB Ect Mult Living   1               Review of Systems  Constitutional: Negative for fever.       10 Systems reviewed and are negative for acute change except as noted in the HPI.  HENT: Negative for congestion.   Eyes: Negative for discharge and redness.  Respiratory: Negative for cough and shortness of breath.   Cardiovascular: Negative for chest pain.  Gastrointestinal: Negative for vomiting and abdominal pain.  Musculoskeletal: Negative for back pain.  Skin: Negative  for rash.  Neurological: Negative for syncope, numbness and headaches.  Psychiatric/Behavioral:       Anxious    Allergies  Review of patient's allergies indicates no known allergies.  Home Medications   Current Outpatient Rx  Name Route Sig Dispense Refill  . DIAZEPAM 5 MG PO TABS Oral Take 5 mg by mouth daily.      Marland Kitchen LAMOTRIGINE 25 MG PO TABS Oral Take 50 mg by mouth at bedtime.      Carma Leaven M PLUS PO TABS Oral Take 1 tablet by mouth daily.      Marland Kitchen OMEPRAZOLE 20 MG PO CPDR Oral Take 20 mg by mouth daily.      Marland Kitchen OXCARBAZEPINE 300 MG PO TABS Oral Take 300 mg by mouth 2 (two) times daily.      Marland Kitchen RISPERIDONE 1 MG PO TABS Oral Take 1 mg by mouth at bedtime.      Marland Kitchen TRIMIPRAMINE MALEATE 50 MG PO CAPS Oral Take 150 mg by mouth at bedtime.        BP 116/85  Pulse 100  Temp 98.3 F (36.8 C) (Oral)  Resp 26  Ht 5' (1.524 m)  Wt 175 lb (79.379 kg)  BMI 34.18 kg/m2  SpO2 100%  LMP 09/05/2011  Physical Exam  Nursing note and vitals reviewed. Constitutional: She is oriented to person, place, and time. She appears well-developed and well-nourished.  Awake, alert, nontoxic appearance.  HENT:  Head: Normocephalic and atraumatic.  Eyes: Conjunctivae normal and EOM are normal. Right eye exhibits no discharge. Left eye exhibits no discharge.  Neck: Normal range of motion. Neck supple.  Cardiovascular: Normal rate and regular rhythm.   Pulmonary/Chest: Effort normal and breath sounds normal. No respiratory distress. She exhibits no tenderness.  Abdominal: Soft. There is no tenderness. There is no rebound.  Genitourinary:       Gravid uterus c/w dates  Musculoskeletal: Normal range of motion. She exhibits no edema and no tenderness.       Baseline ROM, no obvious new focal weakness.  Neurological: She is alert and oriented to person, place, and time.       Mental status and motor strength appears baseline for patient and situation.  Skin: Skin is warm and dry. No rash noted.    Psychiatric: Her behavior is normal. Her mood appears anxious.       anxious    ED Course  Procedures (including critical care time) DIAGNOSTIC STUDIES: Oxygen Saturation is 100% on room air, normal by my interpretation.    COORDINATION OF CARE: 23:51: Provided recommendations to follow-up with Ob-Gyn for eval of medications in treatment of anxiety and panic attacks for consideration of pregnancy. Results for orders placed during the hospital encounter of 04/11/12  URINALYSIS, ROUTINE W REFLEX MICROSCOPIC      Component Value Range   Color, Urine YELLOW  YELLOW   APPearance CLEAR  CLEAR   Specific Gravity, Urine 1.010  1.005 - 1.030   pH 7.5  5.0 - 8.0   Glucose, UA NEGATIVE  NEGATIVE mg/dL   Hgb urine dipstick NEGATIVE  NEGATIVE   Bilirubin Urine NEGATIVE  NEGATIVE   Ketones, ur NEGATIVE  NEGATIVE mg/dL   Protein, ur NEGATIVE  NEGATIVE mg/dL   Urobilinogen, UA 0.2  0.0 - 1.0 mg/dL   Nitrite NEGATIVE  NEGATIVE   Leukocytes, UA SMALL (*) NEGATIVE  URINE MICROSCOPIC-ADD ON      Component Value Range   Squamous Epithelial / LPF MANY (*) RARE   WBC, UA 3-6  <3 WBC/hpf   RBC / HPF 0-2  <3 RBC/hpf   Bacteria, UA FEW (*) RARE   No results found.   No diagnosis found.    MDM  Patient with h/o bipolar disorder and anxiety here with anxiety attack. Has an appointment with her psychiatrist tomorrow. She will follow up with him about the increased anxiety during her pregnancy. She is feeling better since arrival in the ER. Pt stable in ED with no significant deterioration in condition.The patient appears reasonably screened and/or stabilized for discharge and I doubt any other medical condition or other [facility] requiring further screening, evaluation, or treatment in the ED at this time prior to discharge.  I personally performed the services described in this documentation, which was scribed in my presence. The recorded information has been reviewed and considered.   MDM Reviewed:  nursing note and vitals           Nicoletta Dress. Colon Branch, MD 04/12/12 0430

## 2012-04-11 NOTE — ED Notes (Addendum)
Assisted patient to bathroom as requested. Patient ambulated with no assistance. Gait steady. No obvious distress noted. Patient states "I think I may have a urinary tract infection."

## 2012-04-11 NOTE — ED Notes (Signed)
Patient states "I got real mad earlier and I guess I had a panic attack. I just felt bad. I need something for my nerves."

## 2012-04-12 LAB — URINALYSIS, ROUTINE W REFLEX MICROSCOPIC
Bilirubin Urine: NEGATIVE
Glucose, UA: NEGATIVE mg/dL
Hgb urine dipstick: NEGATIVE
Ketones, ur: NEGATIVE mg/dL
Nitrite: NEGATIVE
Protein, ur: NEGATIVE mg/dL
Specific Gravity, Urine: 1.01 (ref 1.005–1.030)
Urobilinogen, UA: 0.2 mg/dL (ref 0.0–1.0)
pH: 7.5 (ref 5.0–8.0)

## 2012-04-12 LAB — URINE MICROSCOPIC-ADD ON

## 2012-04-12 MED ORDER — ALPRAZOLAM 0.5 MG PO TABS
0.2500 mg | ORAL_TABLET | Freq: Once | ORAL | Status: AC
  Administered 2012-04-12: 0.25 mg via ORAL
  Filled 2012-04-12: qty 1

## 2012-04-26 ENCOUNTER — Emergency Department (HOSPITAL_COMMUNITY)
Admission: EM | Admit: 2012-04-26 | Discharge: 2012-04-26 | Disposition: A | Discharge status: Home / Self Care | Payer: Medicare Other | Attending: Emergency Medicine | Admitting: Emergency Medicine

## 2012-04-26 ENCOUNTER — Encounter (HOSPITAL_COMMUNITY): Payer: Self-pay | Admitting: Emergency Medicine

## 2012-04-26 DIAGNOSIS — M545 Low back pain, unspecified: Secondary | ICD-10-CM | POA: Insufficient documentation

## 2012-04-26 DIAGNOSIS — R197 Diarrhea, unspecified: Secondary | ICD-10-CM | POA: Insufficient documentation

## 2012-04-26 DIAGNOSIS — F410 Panic disorder [episodic paroxysmal anxiety] without agoraphobia: Secondary | ICD-10-CM | POA: Insufficient documentation

## 2012-04-26 DIAGNOSIS — F309 Manic episode, unspecified: Secondary | ICD-10-CM | POA: Insufficient documentation

## 2012-04-26 DIAGNOSIS — F329 Major depressive disorder, single episode, unspecified: Secondary | ICD-10-CM | POA: Insufficient documentation

## 2012-04-26 DIAGNOSIS — K219 Gastro-esophageal reflux disease without esophagitis: Secondary | ICD-10-CM | POA: Insufficient documentation

## 2012-04-26 DIAGNOSIS — N390 Urinary tract infection, site not specified: Secondary | ICD-10-CM

## 2012-04-26 DIAGNOSIS — R350 Frequency of micturition: Secondary | ICD-10-CM | POA: Insufficient documentation

## 2012-04-26 DIAGNOSIS — O2390 Unspecified genitourinary tract infection in pregnancy, unspecified trimester: Secondary | ICD-10-CM | POA: Insufficient documentation

## 2012-04-26 DIAGNOSIS — Z79899 Other long term (current) drug therapy: Secondary | ICD-10-CM | POA: Insufficient documentation

## 2012-04-26 DIAGNOSIS — F3289 Other specified depressive episodes: Secondary | ICD-10-CM | POA: Insufficient documentation

## 2012-04-26 DIAGNOSIS — Z9089 Acquired absence of other organs: Secondary | ICD-10-CM | POA: Insufficient documentation

## 2012-04-26 DIAGNOSIS — R1110 Vomiting, unspecified: Secondary | ICD-10-CM | POA: Insufficient documentation

## 2012-04-26 LAB — URINALYSIS, ROUTINE W REFLEX MICROSCOPIC
Bilirubin Urine: NEGATIVE
Glucose, UA: NEGATIVE mg/dL
Hgb urine dipstick: NEGATIVE
Ketones, ur: NEGATIVE mg/dL
Nitrite: NEGATIVE
Protein, ur: NEGATIVE mg/dL
Specific Gravity, Urine: 1.01 (ref 1.005–1.030)
Urobilinogen, UA: 0.2 mg/dL (ref 0.0–1.0)
pH: 7.5 (ref 5.0–8.0)

## 2012-04-26 LAB — URINE MICROSCOPIC-ADD ON

## 2012-04-26 MED ORDER — ONDANSETRON HCL 4 MG PO TABS: 4.0000 mg | ORAL_TABLET | Freq: Four times a day (QID) | ORAL | Status: DC

## 2012-04-26 MED ORDER — SODIUM CHLORIDE 0.9 % IV BOLUS (SEPSIS)
1000.0000 mL | Freq: Once | INTRAVENOUS | Status: AC
  Administered 2012-04-26: 1000 mL via INTRAVENOUS

## 2012-04-26 MED ORDER — ONDANSETRON HCL 4 MG/2ML IJ SOLN
4.0000 mg | Freq: Once | INTRAMUSCULAR | Status: AC
  Administered 2012-04-26: 4 mg via INTRAVENOUS
  Filled 2012-04-26: qty 2

## 2012-04-26 MED ORDER — AMOXICILLIN 500 MG PO CAPS: 500.0000 mg | ORAL_CAPSULE | Freq: Three times a day (TID) | ORAL | Status: DC

## 2012-04-26 NOTE — ED Notes (Signed)
Pt c/o abd pain with n/v/d x 4 days. Pt is approximately [redacted] weeks pregnant.

## 2012-04-26 NOTE — ED Provider Notes (Signed)
History   This chart was scribed for Benny Lennert, MD by Melba Coon. The patient was seen in room APA01/APA01 and the patient's care was started at 7:59PM.    CSN: [id number]  Arrival date & time 04/26/12  1727   First MD Initiated Contact with Patient 04/26/12 1926      Chief Complaint  Patient presents with  . Abdominal Pain  . Emesis  . Diarrhea    (Consider location/radiation/quality/duration/timing/severity/associated sxs/prior treatment) Patient is a 21 y.o. female presenting with abdominal pain, vomiting, and diarrhea. The history is provided by the patient. No language interpreter was used.  Abdominal Pain The primary symptoms of the illness include abdominal pain, nausea, vomiting and diarrhea. The primary symptoms of the illness do not include fatigue or vaginal discharge. The current episode started 2 days ago. The onset of the illness was sudden. The problem has not changed since onset. The pain came on suddenly. The abdominal pain has been unchanged since its onset. The abdominal pain is generalized. The abdominal pain does not radiate. The abdominal pain is relieved by nothing. The abdominal pain is exacerbated by movement, vomiting and bowel movements.  The vomiting began 2 days ago. The emesis contains stomach contents.  Diarrhea characteristics: dark and stringy.  The patient states that she believes she is currently pregnant (31 weeks). Additional symptoms associated with the illness include frequency and back pain. Symptoms associated with the illness do not include hematuria. Significant associated medical issues include GERD.  Emesis  Associated symptoms include abdominal pain and diarrhea. Pertinent negatives include no cough and no headaches.  Diarrhea The primary symptoms include abdominal pain, nausea, vomiting and diarrhea. Primary symptoms do not include fatigue or rash.  The illness is also significant for back pain. Significant associated medical  issues include GERD.  Phenergan did not alleviate the nausea. Ms Betton reports that her boyfriend had similar symptoms in the past week. She also believes that she has an STD. No other pertinent symptoms.  Past Medical History  Diagnosis Date  . GERD (gastroesophageal reflux disease)   . Bipolar 1 disorder   . Depression   . Panic attack     Past Surgical History  Procedure Date  . Tonsillectomy     No family history on file.  History  Substance Use Topics  . Smoking status: Current Every Day Smoker -- 0.5 packs/day    Types: Cigarettes  . Smokeless tobacco: Not on file  . Alcohol Use: No    OB History    Grav Para Term Preterm Abortions TAB SAB Ect Mult Living   1               Review of Systems  Constitutional: Negative for fatigue.  HENT: Negative for congestion, sinus pressure and ear discharge.        (+) headache  Eyes: Negative for discharge.  Respiratory: Negative for cough.   Cardiovascular: Negative for chest pain.  Gastrointestinal: Positive for nausea, vomiting, abdominal pain and diarrhea.  Genitourinary: Positive for frequency. Negative for hematuria and vaginal discharge.       (-) leakage or d/c.  Musculoskeletal: Positive for back pain.  Skin: Negative for rash.  Neurological: Negative for seizures and headaches.  Hematological: Negative.   Psychiatric/Behavioral: Negative for hallucinations.  All other systems reviewed and are negative.   Allergies  Review of patient's allergies indicates no known allergies.  Home Medications   Current Outpatient Rx  Name Route Sig Dispense Refill  . DIAZEPAM  5 MG PO TABS Oral Take 5 mg by mouth daily.      Marland Kitchen LAMOTRIGINE 25 MG PO TABS Oral Take 50 mg by mouth at bedtime.      Carma Leaven M PLUS PO TABS Oral Take 1 tablet by mouth daily.      Marland Kitchen OMEPRAZOLE 20 MG PO CPDR Oral Take 20 mg by mouth daily.      Marland Kitchen OXCARBAZEPINE 300 MG PO TABS Oral Take 300 mg by mouth 2 (two) times daily.      Marland Kitchen RISPERIDONE 1 MG  PO TABS Oral Take 1 mg by mouth at bedtime.      Marland Kitchen TRIMIPRAMINE MALEATE 50 MG PO CAPS Oral Take 150 mg by mouth at bedtime.        BP 142/75  Pulse 99  Temp 97.9 F (36.6 C)  Resp 18  Ht 5' (1.524 m)  Wt 182 lb (82.555 kg)  BMI 35.54 kg/m2  SpO2 100%  LMP 09/05/2011  Physical Exam  Nursing note and vitals reviewed. Constitutional: She is oriented to person, place, and time. She appears well-developed.  HENT:  Head: Normocephalic and atraumatic.  Eyes: Conjunctivae normal and EOM are normal. No scleral icterus.  Neck: Neck supple. No thyromegaly present.  Cardiovascular: Normal rate and regular rhythm.  Exam reveals no gallop and no friction rub.   No murmur heard. Pulmonary/Chest: No stridor. She has no wheezes. She has no rales. She exhibits no tenderness.  Abdominal: She exhibits distension (normal for 31 weeks pregant). There is tenderness (mild suprapubic tenderness). There is no rebound.  Genitourinary:       White discharge.  Os closed.    Musculoskeletal: Normal range of motion. She exhibits no edema.  Lymphadenopathy:    She has no cervical adenopathy.  Neurological: She is oriented to person, place, and time. Coordination normal.  Skin: No rash noted. No erythema.  Psychiatric: She has a normal mood and affect. Her behavior is normal.    ED Course  Procedures (including critical care time)  DIAGNOSTIC STUDIES: Oxygen Saturation is 100% on room air, normal by my interpretation.    COORDINATION OF CARE:  8:02PM - IV fluids, zofran, and UA will be ordered for Ms Leonhard.   Labs Reviewed  URINALYSIS, ROUTINE W REFLEX MICROSCOPIC - Abnormal; Notable for the following:    APPearance HAZY (*)     Leukocytes, UA TRACE (*)     All other components within normal limits  URINE MICROSCOPIC-ADD ON - Abnormal; Notable for the following:    Squamous Epithelial / LPF FEW (*)     Bacteria, UA FEW (*)     All other components within normal limits  URINE CULTURE   No  results found.   No diagnosis found.    MDM  The chart was scribed for me under my direct supervision.  I personally performed the history, physical, and medical decision making and all procedures in the evaluation of this patient.Benny Lennert, MD 04/26/12 2225

## 2012-04-26 NOTE — Progress Notes (Signed)
Notified Cathie Beams CNM to report on pt. Status.

## 2012-04-26 NOTE — Progress Notes (Signed)
Notified French Ana RN concerning FHR not tracing

## 2012-04-26 NOTE — ED Notes (Signed)
Pelvic exam completed by Dr. Estell Harpin.  Patient tolerated procedure well.

## 2012-04-26 NOTE — ED Notes (Signed)
Discharge instructions given and reviewed with patient.  Prescriptions given for Amoxicillin and Zofran; effects and use explained.  Patient verbalized understanding to complete all antibiotic and to keep scheduled appointment on Thursday.  Patient ambulatory; discharged home in good condition.

## 2012-04-26 NOTE — Progress Notes (Signed)
Notified French Ana RN concerning FHR not tracing and for update on patient

## 2012-04-26 NOTE — Progress Notes (Signed)
Kizzie Ide RN at [facility] ED for update on pt. Pt complain of nausea and diarrhea and complains of white discharge. Pt. Has been taking Diflucan x 1 week for yeast infection but pt. Reports taking medication has not improved symptoms. Reported to [facility] and uterine activity

## 2012-04-26 NOTE — Progress Notes (Signed)
Received call from Juanda Chance at [facility] ED about pt. And monitors placed.

## 2012-04-28 LAB — URINE CULTURE
Colony Count: NO GROWTH
Culture: NO GROWTH

## 2012-04-28 LAB — GC/CHLAMYDIA PROBE AMP, GENITAL
Chlamydia, DNA Probe: NEGATIVE
GC Probe Amp, Genital: NEGATIVE

## 2012-05-06 ENCOUNTER — Emergency Department (HOSPITAL_COMMUNITY)
Admission: EM | Admit: 2012-05-06 | Discharge: 2012-05-06 | Disposition: A | Discharge status: Home / Self Care | Payer: Medicare Other | Attending: Emergency Medicine | Admitting: Emergency Medicine

## 2012-05-06 ENCOUNTER — Encounter (HOSPITAL_COMMUNITY): Payer: Self-pay | Admitting: *Deleted

## 2012-05-06 DIAGNOSIS — F319 Bipolar disorder, unspecified: Secondary | ICD-10-CM | POA: Insufficient documentation

## 2012-05-06 DIAGNOSIS — R51 Headache: Secondary | ICD-10-CM

## 2012-05-06 DIAGNOSIS — Z87891 Personal history of nicotine dependence: Secondary | ICD-10-CM | POA: Insufficient documentation

## 2012-05-06 DIAGNOSIS — F329 Major depressive disorder, single episode, unspecified: Secondary | ICD-10-CM | POA: Insufficient documentation

## 2012-05-06 DIAGNOSIS — Z3490 Encounter for supervision of normal pregnancy, unspecified, unspecified trimester: Secondary | ICD-10-CM

## 2012-05-06 DIAGNOSIS — O99340 Other mental disorders complicating pregnancy, unspecified trimester: Secondary | ICD-10-CM | POA: Insufficient documentation

## 2012-05-06 DIAGNOSIS — K219 Gastro-esophageal reflux disease without esophagitis: Secondary | ICD-10-CM | POA: Insufficient documentation

## 2012-05-06 DIAGNOSIS — F3289 Other specified depressive episodes: Secondary | ICD-10-CM | POA: Insufficient documentation

## 2012-05-06 HISTORY — DX: Encounter for supervision of normal pregnancy, unspecified, unspecified trimester: Z34.90

## 2012-05-06 LAB — CBC WITH DIFFERENTIAL/PLATELET
Basophils Absolute: 0 10*3/uL (ref 0.0–0.1)
Basophils Relative: 0 % (ref 0–1)
Eosinophils Absolute: 0.1 10*3/uL (ref 0.0–0.7)
Eosinophils Relative: 1 % (ref 0–5)
HCT: 30.3 % — ABNORMAL LOW (ref 36.0–46.0)
Hemoglobin: 10.2 g/dL — ABNORMAL LOW (ref 12.0–15.0)
Lymphocytes Relative: 20 % (ref 12–46)
Lymphs Abs: 2.6 10*3/uL (ref 0.7–4.0)
MCH: 30.5 pg (ref 26.0–34.0)
MCHC: 33.7 g/dL (ref 30.0–36.0)
MCV: 90.7 fL (ref 78.0–100.0)
Monocytes Absolute: 1 10*3/uL (ref 0.1–1.0)
Monocytes Relative: 8 % (ref 3–12)
Neutro Abs: 9.2 10*3/uL — ABNORMAL HIGH (ref 1.7–7.7)
Neutrophils Relative %: 71 % (ref 43–77)
Platelets: 313 10*3/uL (ref 150–400)
RBC: 3.34 MIL/uL — ABNORMAL LOW (ref 3.87–5.11)
RDW: 13.4 % (ref 11.5–15.5)
WBC: 12.9 10*3/uL — ABNORMAL HIGH (ref 4.0–10.5)

## 2012-05-06 LAB — BASIC METABOLIC PANEL
BUN: <3 mg/dL — ABNORMAL LOW (ref 6–23)
CO2: 22 mEq/L (ref 19–32)
Calcium: 9.4 mg/dL (ref 8.4–10.5)
Chloride: 104 mEq/L (ref 96–112)
Creatinine, Ser: 0.55 mg/dL (ref 0.50–1.10)
GFR calc Af Amer: >90 mL/min (ref >90)
GFR calc non Af Amer: >90 mL/min (ref >90)
Glucose, Bld: 85 mg/dL (ref 70–99)
Potassium: 3.6 mEq/L (ref 3.5–5.1)
Sodium: 136 mEq/L (ref 135–145)

## 2012-05-06 LAB — URINALYSIS, ROUTINE W REFLEX MICROSCOPIC
Bilirubin Urine: NEGATIVE
Bilirubin Urine: NEGATIVE
Glucose, UA: NEGATIVE mg/dL
Glucose, UA: NEGATIVE mg/dL
Hgb urine dipstick: NEGATIVE
Hgb urine dipstick: NEGATIVE
Ketones, ur: NEGATIVE mg/dL
Ketones, ur: NEGATIVE mg/dL
Nitrite: NEGATIVE
Nitrite: NEGATIVE
Protein, ur: NEGATIVE mg/dL
Protein, ur: NEGATIVE mg/dL
Specific Gravity, Urine: 1.015 (ref 1.005–1.030)
Specific Gravity, Urine: 1.01 (ref 1.005–1.030)
Urobilinogen, UA: 0.2 mg/dL (ref 0.0–1.0)
Urobilinogen, UA: 0.2 mg/dL (ref 0.0–1.0)
pH: 6 (ref 5.0–8.0)
pH: 6.5 (ref 5.0–8.0)

## 2012-05-06 LAB — URINE MICROSCOPIC-ADD ON

## 2012-05-06 MED ORDER — DEXAMETHASONE SODIUM PHOSPHATE 4 MG/ML IJ SOLN
10.0000 mg | Freq: Once | INTRAMUSCULAR | Status: AC
  Administered 2012-05-06: 10 mg via INTRAVENOUS
  Filled 2012-05-06: qty 3

## 2012-05-06 MED ORDER — METOCLOPRAMIDE HCL 5 MG/ML IJ SOLN
10.0000 mg | Freq: Once | INTRAMUSCULAR | Status: AC
  Administered 2012-05-06: 10 mg via INTRAVENOUS
  Filled 2012-05-06: qty 2

## 2012-05-06 MED ORDER — MORPHINE SULFATE 4 MG/ML IJ SOLN
4.0000 mg | Freq: Once | INTRAMUSCULAR | Status: AC
  Administered 2012-05-06: 4 mg via INTRAVENOUS
  Filled 2012-05-06: qty 1

## 2012-05-06 MED ORDER — SODIUM CHLORIDE 0.9 % IV SOLN
Freq: Once | INTRAVENOUS | Status: AC
  Administered 2012-05-06: 19:00:00 via INTRAVENOUS

## 2012-05-06 MED ORDER — DIPHENHYDRAMINE HCL 50 MG/ML IJ SOLN
25.0000 mg | Freq: Once | INTRAMUSCULAR | Status: AC
  Administered 2012-05-06: 25 mg via INTRAVENOUS
  Filled 2012-05-06: qty 1

## 2012-05-06 NOTE — ED Notes (Signed)
Patient states she does not need anything at this time. 

## 2012-05-06 NOTE — ED Notes (Signed)
RN at bedside

## 2012-05-06 NOTE — Progress Notes (Signed)
Requested to start central monitoring on pt @ 1948.  Pt is 31 wks, c/o headache and flank pain. No complaints of vag bleeding or leaking fluid.  BP wnl.

## 2012-05-06 NOTE — ED Notes (Signed)
Patient is comfortable at this time. Patient was assisted to bedroom and back to room. RN hooked patient back up to the monitor.

## 2012-05-06 NOTE — ED Provider Notes (Signed)
History     CSN: [id number]  Arrival date & time 05/06/12  1814   First MD Initiated Contact with Patient 05/06/12 1821      Chief Complaint  Patient presents with  . Headache    (Consider location/radiation/quality/duration/timing/severity/associated sxs/prior treatment) HPI Comments: Patient is a 21 year old woman who is approximately [redacted] weeks pregnant. She has been having very bad headaches for about 2 weeks. She saw her obstetrician, Christin Bach M.D., on October 31 and was prescribed Fioricet for headache and Zofran for nausea. Today she says it hurts so bad she can't open her eyes. Also she is having a cramping pain in her left flank. She therefore seeks evaluation.  Patient is a 21 y.o. female presenting with headaches. The history is provided by the patient and medical records.  Headache  This is a recurrent problem. The current episode started more than 1 week ago. The problem occurs every few hours. The problem has been gradually worsening. The headache is associated with bright light. The quality of the pain is described as sharp. The pain is at a severity of 9/10. The pain is severe. The pain does not radiate. Pertinent negatives include no fever. Associated symptoms comments: Left flank pain. Treatments tried: Fioricet for headache and Zofran for nausea, without relief.    Past Medical History  Diagnosis Date  . GERD (gastroesophageal reflux disease)   . Bipolar 1 disorder   . Depression   . Panic attack   . Pregnant     Past Surgical History  Procedure Date  . Tonsillectomy     History reviewed. No pertinent family history.  History  Substance Use Topics  . Smoking status: Former Smoker -- 0.5 packs/day    Types: Cigarettes    Quit date: 04/19/2012  . Smokeless tobacco: Not on file  . Alcohol Use: No    OB History    Grav Para Term Preterm Abortions TAB SAB Ect Mult Living   1               Review of Systems  Constitutional: Negative for fever and  chills.  Eyes: Positive for photophobia.  Respiratory: Negative.   Cardiovascular: Negative.   Gastrointestinal: Positive for abdominal pain.  Genitourinary: Positive for flank pain.  Musculoskeletal: Negative.   Skin: Negative.   Neurological: Positive for headaches.  Psychiatric/Behavioral: Negative.     Allergies  Review of patient's allergies indicates no known allergies.  Home Medications   Current Outpatient Rx  Name  Route  Sig  Dispense  Refill  . AMOXICILLIN 500 MG PO CAPS   Oral   Take 1 capsule (500 mg total) by mouth 3 (three) times daily.   21 capsule   0   . OMEPRAZOLE 20 MG PO CPDR   Oral   Take 20 mg by mouth daily.           Marland Kitchen ONDANSETRON HCL 4 MG PO TABS   Oral   Take 1 tablet (4 mg total) by mouth every 6 (six) hours.   12 tablet   0   . PROMETHAZINE HCL 25 MG PO TABS   Oral   Take 25 mg by mouth every 6 (six) hours as needed. nausea           BP 122/69  Pulse 103  Temp 98 F (36.7 C) (Oral)  Resp 20  Ht 5' (1.524 m)  Wt 182 lb (82.555 kg)  BMI 35.54 kg/m2  SpO2 99%  LMP 09/05/2011  Physical  Exam  Nursing note and vitals reviewed. Constitutional: She is oriented to person, place, and time. She appears well-developed and well-nourished.       In moderate to severe distress complaining of frontal headache and left flank pain.  HENT:  Head: Normocephalic and atraumatic.  Right Ear: External ear normal.  Left Ear: External ear normal.  Mouth/Throat: Oropharynx is clear and moist.  Eyes: Conjunctivae normal and EOM are normal. Pupils are equal, round, and reactive to light. No scleral icterus.  Neck: Normal range of motion. Neck supple.  Cardiovascular: Normal rate, regular rhythm and normal heart sounds.   Pulmonary/Chest: Effort normal and breath sounds normal.  Abdominal: Soft. Bowel sounds are normal.       She localizes pain to the left flank. There is no mass or tenderness.  Musculoskeletal: Normal range of motion. She  exhibits no edema and no tenderness.  Neurological: She is alert and oriented to person, place, and time.       No sensory or motor deficit.  Skin: Skin is warm and dry.  Psychiatric: She has a normal mood and affect. Her behavior is normal.    ED Course  Procedures (including critical care time)  7:02 PM Call to PA at [facility] MAU --> safe to give IV Reglan, dexamethasone, and Benadryl for her headache.  Lab workup ordered.  Fetal monitoring ordered.  8:39 PM Results for orders placed during the hospital encounter of 05/06/12  URINALYSIS, ROUTINE W REFLEX MICROSCOPIC      Component Value Range   Color, Urine YELLOW  YELLOW   APPearance CLEAR  CLEAR   Specific Gravity, Urine 1.015  1.005 - 1.030   pH 6.0  5.0 - 8.0   Glucose, UA NEGATIVE  NEGATIVE mg/dL   Hgb urine dipstick NEGATIVE  NEGATIVE   Bilirubin Urine NEGATIVE  NEGATIVE   Ketones, ur NEGATIVE  NEGATIVE mg/dL   Protein, ur NEGATIVE  NEGATIVE mg/dL   Urobilinogen, UA 0.2  0.0 - 1.0 mg/dL   Nitrite NEGATIVE  NEGATIVE   Leukocytes, UA TRACE (*) NEGATIVE  CBC WITH DIFFERENTIAL      Component Value Range   WBC 12.9 (*) 4.0 - 10.5 K/uL   RBC 3.34 (*) 3.87 - 5.11 MIL/uL   Hemoglobin 10.2 (*) 12.0 - 15.0 g/dL   HCT 86.5 (*) 78.4 - 69.6 %   MCV 90.7  78.0 - 100.0 fL   MCH 30.5  26.0 - 34.0 pg   MCHC 33.7  30.0 - 36.0 g/dL   RDW 29.5  28.4 - 13.2 %   Platelets 313  150 - 400 K/uL   Neutrophils Relative 71  43 - 77 %   Neutro Abs 9.2 (*) 1.7 - 7.7 K/uL   Lymphocytes Relative 20  12 - 46 %   Lymphs Abs 2.6  0.7 - 4.0 K/uL   Monocytes Relative 8  3 - 12 %   Monocytes Absolute 1.0  0.1 - 1.0 K/uL   Eosinophils Relative 1  0 - 5 %   Eosinophils Absolute 0.1  0.0 - 0.7 K/uL   Basophils Relative 0  0 - 1 %   Basophils Absolute 0.0  0.0 - 0.1 K/uL  BASIC METABOLIC PANEL      Component Value Range   Sodium 136  135 - 145 mEq/L   Potassium 3.6  3.5 - 5.1 mEq/L   Chloride 104  96 - 112 mEq/L   CO2 22  19 - 32 mEq/L    Glucose,  Bld 85  70 - 99 mg/dL   BUN <3 (*) 6 - 23 mg/dL   Creatinine, Ser 8.65  0.50 - 1.10 mg/dL   Calcium 9.4  8.4 - 78.4 mg/dL   GFR calc non Af Amer >90  >90 mL/min   GFR calc Af Amer >90  >90 mL/min  URINE MICROSCOPIC-ADD ON      Component Value Range   Squamous Epithelial / LPF MANY (*) RARE   WBC, UA 0-2  <3 WBC/hpf   RBC / HPF 0-2  <3 RBC/hpf   Bacteria, UA FEW (*) RARE   Urine-Other MUCOUS PRESENT        Fetal monitoring was normal.  Lab workup was negative.  Continues to complain of headache, so will give Morphine 4 mg IV as a rescue medication.  Then plan to release her.   1. Headache   2. Pregnancy            Carleene Cooper III, MD 05/06/12 2047

## 2012-05-06 NOTE — ED Notes (Signed)
Headache for 2 weeks, No N/v,    No injury to head.Pain lt flank. Pt is [redacted] weeks pregnant,.  No vag bleeding or d/c

## 2012-05-10 ENCOUNTER — Observation Stay: Payer: Self-pay

## 2012-05-11 ENCOUNTER — Inpatient Hospital Stay (HOSPITAL_COMMUNITY)
Admission: AD | Admit: 2012-05-11 | Discharge: 2012-05-11 | Disposition: A | Discharge status: Home / Self Care | Payer: Medicare Other | Source: Ambulatory Visit | Attending: Obstetrics and Gynecology | Admitting: Obstetrics and Gynecology

## 2012-05-11 ENCOUNTER — Inpatient Hospital Stay (HOSPITAL_COMMUNITY): Payer: Medicare Other

## 2012-05-11 ENCOUNTER — Encounter (HOSPITAL_COMMUNITY): Payer: Self-pay | Admitting: *Deleted

## 2012-05-11 DIAGNOSIS — R109 Unspecified abdominal pain: Secondary | ICD-10-CM | POA: Insufficient documentation

## 2012-05-11 DIAGNOSIS — R51 Headache: Secondary | ICD-10-CM | POA: Insufficient documentation

## 2012-05-11 DIAGNOSIS — R42 Dizziness and giddiness: Secondary | ICD-10-CM | POA: Insufficient documentation

## 2012-05-11 DIAGNOSIS — O26899 Other specified pregnancy related conditions, unspecified trimester: Secondary | ICD-10-CM

## 2012-05-11 DIAGNOSIS — R519 Headache, unspecified: Secondary | ICD-10-CM

## 2012-05-11 DIAGNOSIS — O99891 Other specified diseases and conditions complicating pregnancy: Secondary | ICD-10-CM | POA: Insufficient documentation

## 2012-05-11 HISTORY — DX: Headache: R51

## 2012-05-11 MED ORDER — DIPHENHYDRAMINE HCL 50 MG/ML IJ SOLN
25.0000 mg | Freq: Once | INTRAMUSCULAR | Status: AC
  Administered 2012-05-11: 25 mg via INTRAVENOUS
  Filled 2012-05-11: qty 1

## 2012-05-11 MED ORDER — LACTATED RINGERS IV SOLN
INTRAVENOUS | Status: DC
  Administered 2012-05-11: 12:00:00 via INTRAVENOUS

## 2012-05-11 MED ORDER — PROMETHAZINE HCL 25 MG PO TABS: 12.5000 mg | ORAL_TABLET | Freq: Four times a day (QID) | ORAL | Status: DC | PRN

## 2012-05-11 MED ORDER — PROMETHAZINE HCL 25 MG/ML IJ SOLN
12.5000 mg | Freq: Once | INTRAMUSCULAR | Status: AC
  Administered 2012-05-11: 12.5 mg via INTRAVENOUS
  Filled 2012-05-11: qty 1

## 2012-05-11 MED ORDER — DEXAMETHASONE SODIUM PHOSPHATE 4 MG/ML IJ SOLN
4.0000 mg | Freq: Once | INTRAMUSCULAR | Status: AC
  Administered 2012-05-11: 4 mg via INTRAVENOUS
  Filled 2012-05-11: qty 1

## 2012-05-11 MED ORDER — MORPHINE SULFATE 4 MG/ML IJ SOLN
2.0000 mg | INTRAMUSCULAR | Status: DC | PRN
  Filled 2012-05-11: qty 1

## 2012-05-11 MED ORDER — HYDROXYZINE HCL 10 MG PO TABS
10.0000 mg | ORAL_TABLET | Freq: Three times a day (TID) | ORAL | Status: DC | PRN
Start: 2012-05-11 — End: 2012-06-19

## 2012-05-11 MED ORDER — MORPHINE SULFATE 4 MG/ML IJ SOLN
4.0000 mg | INTRAMUSCULAR | Status: DC | PRN
  Administered 2012-05-11 (×2): 4 mg via INTRAVENOUS
  Filled 2012-05-11: qty 1

## 2012-05-11 NOTE — MAU Note (Signed)
Headache, dizziness and pain in left side past few days. Had been to [facility]. Dr Emelda Fear gave her meds for headache.

## 2012-05-11 NOTE — Progress Notes (Signed)
MD notified of CT results, pain assessment, pt requesting crackers & drink.  OK for pt to have po;s.

## 2012-05-11 NOTE — MAU Provider Note (Signed)
History     CSN: [id number]  Arrival date and time: 05/11/12 1002   None   Chief Complaint  Patient presents with  . Headache  . Abdominal Pain  . Dizziness   HPI Patient is a 21 yo G1 at [redacted]w[redacted]d presenting to MAU for 2 weeks of headache, progressively worse in the last week. She was evaluated at [facility] where she gets prenatal care and received Rx for Fioricet which did not help. She was then seen at [facility] ED for headache, labs were checked and she was given a headache cocktail of Reglan/steroid/benadryl. This did not help either. She was given Rx for Hydrocodone at next visit at Dr. Emelda Fear to take, which did not help either. She reports frontal headache, nausea, dizziness and photophobia. She does not have history of headaches. She also reports a sharp pain in left side. She has been unable to tolerate PO due to vomiting.   No bleeding, LOF. Good fetal movement. No contractions.  OB History    Grav Para Term Preterm Abortions TAB SAB Ect Mult Living   1               Past Medical History  Diagnosis Date  . GERD (gastroesophageal reflux disease)   . Bipolar 1 disorder   . Depression   . Panic attack   . Pregnant   . Headache     Past Surgical History  Procedure Date  . Tonsillectomy     History reviewed. No pertinent family history.  History  Substance Use Topics  . Smoking status: Former Smoker -- 0.5 packs/day    Types: Cigarettes    Quit date: 04/19/2012  . Smokeless tobacco: Not on file  . Alcohol Use: No    Allergies: No Known Allergies  Prescriptions prior to admission  Medication Sig Dispense Refill  . acetaminophen (TYLENOL) 500 MG tablet Take 500 mg by mouth every 6 (six) hours as needed. For headache/pain      . amoxicillin (AMOXIL) 500 MG capsule Take 1 capsule (500 mg total) by mouth 3 (three) times daily.  21 capsule  0  . butalbital-acetaminophen-caffeine (FIORICET, ESGIC) 50-325-40 MG per tablet Take 1 tablet by mouth every 8 (eight)  hours as needed. For headache      . HYDROcodone-acetaminophen (NORCO/VICODIN) 5-325 MG per tablet Take 1 tablet by mouth every 6 (six) hours as needed. For headaches.      Marland Kitchen omeprazole (PRILOSEC) 20 MG capsule Take 20 mg by mouth daily.        . ondansetron (ZOFRAN) 8 MG tablet Take 8 mg by mouth every 8 (eight) hours as needed. For nausea and/or vomting        Review of Systems  Constitutional: Negative for fever and chills.  Respiratory: Negative for shortness of breath.   Cardiovascular: Negative for chest pain.  Gastrointestinal: Positive for nausea and vomiting.  Genitourinary: Negative for dysuria.  Musculoskeletal: Positive for back pain.  Skin: Negative for rash.  Neurological: Positive for dizziness and headaches.   Physical Exam   Blood pressure 136/80, pulse 93, temperature 97.4 F (36.3 C), temperature source Oral, resp. rate 20, last menstrual period 09/05/2011.  Physical Exam  Constitutional: She is oriented to person, place, and time. She appears well-developed and well-nourished. She appears distressed.       In dark room, appears uncomfortable  HENT:  Head: Normocephalic and atraumatic.  Neck: Normal range of motion.  Cardiovascular: Normal rate and regular rhythm.   Respiratory: Effort  normal and breath sounds normal.  GI: Soft.       Gravid. Toco in place.  Musculoskeletal: Normal range of motion. She exhibits no edema and no tenderness.  Neurological: She is alert and oriented to person, place, and time. She has normal strength. She is not disoriented. No cranial nerve deficit or sensory deficit.  Skin: Skin is warm and dry. No rash noted.    MAU Course  Procedures  FHT 140's, reactive strip No contractions noted  MDM 1120- Patient appears uncomfortable. No neurological deficits. Will treat with modified headache cocktail of Phenergan 12.5mg , Benadryl 25mg  and Decadron 4mg . Will also give LR bolus since patient has not been able to tolerate PO for many  days. Continue to monitor FHT and will re-evaluate.  1255- Patient's headache unchanged with HA cocktail. Discussed with Dr. Jolayne Panther. Will prescribe Morphine for pain and get head CT. Patient updated  1400- CT scan negative. Pain 7/10 and requesting crackers. Will give another dose of Morphine.  1430- Discussed with Caren Griffins, CNM. Pain has not resolved, but we have attempted to abort HA. Other therapies are contraindicated in pregnancy. Will discharge home with close follow up with [facility]. Baby continues to be reactive on strip.   Ct Head Wo Contrast 05/11/2012  *RADIOLOGY REPORT*  Clinical Data: Pregnant with severe headache.  CT HEAD WITHOUT CONTRAST  Technique:  Contiguous axial images were obtained from the base of the skull through the vertex without contrast. The abdomen was shielded with a lead apron.  Comparison: None.  Findings: There is no evidence for acute infarction, intracranial hemorrhage, mass lesion, hydrocephalus, or extra-axial fluid. There is no atrophy or visible white matter disease.  Calvarium is intact. No evidence for diffuse cerebral edema.  Sinuses and mastoids are clear.  IMPRESSION: Negative exam.   Original Report Authenticated By: Davonna Belling, M.D.     Assessment and Plan  21 yo G1P0 at [redacted]w[redacted]d with protracted headache in pregnancy  1. Headache 2. Normal pregnancy in 3rd trimester  -Unable to fully abort migraine headache. Will discharge home with labor precautions -Will give Rx for Vistaril and Phenergan to take in addition to her Hydrocodone -Follow up with [facility] on 11/13  [place], AMBER 05/11/2012, 10:55 AM  Evaluation and management procedures were performed by Resident physician under my supervision/collaboration. Chart reviewed, patient examined by me and I agree with management and plan.  Danae Orleans, CNM 05/11/2012 5:59 PM

## 2012-05-11 NOTE — MAU Note (Signed)
Keep feeling dizzy and weak, feels hot like has fever, but when checked is ok.

## 2012-05-13 NOTE — MAU Provider Note (Signed)
Attestation of Attending Supervision of Advanced Practitioner (CNM/NP): Evaluation and management procedures were performed by the Advanced Practitioner under my supervision and collaboration.  I have reviewed the Advanced Practitioner's note and chart, and I agree with the management and plan.  Merida Alcantar 05/13/2012 10:48 AM   

## 2012-05-17 ENCOUNTER — Emergency Department (HOSPITAL_COMMUNITY)
Admission: EM | Admit: 2012-05-17 | Discharge: 2012-05-17 | Disposition: A | Discharge status: Home / Self Care | Payer: Medicare Other | Attending: Emergency Medicine | Admitting: Emergency Medicine

## 2012-05-17 ENCOUNTER — Encounter (HOSPITAL_COMMUNITY): Payer: Self-pay | Admitting: *Deleted

## 2012-05-17 DIAGNOSIS — O479 False labor, unspecified: Secondary | ICD-10-CM

## 2012-05-17 DIAGNOSIS — Z87891 Personal history of nicotine dependence: Secondary | ICD-10-CM | POA: Insufficient documentation

## 2012-05-17 DIAGNOSIS — Z8719 Personal history of other diseases of the digestive system: Secondary | ICD-10-CM | POA: Insufficient documentation

## 2012-05-17 DIAGNOSIS — Z8659 Personal history of other mental and behavioral disorders: Secondary | ICD-10-CM | POA: Insufficient documentation

## 2012-05-17 DIAGNOSIS — Z79899 Other long term (current) drug therapy: Secondary | ICD-10-CM | POA: Insufficient documentation

## 2012-05-17 LAB — URINALYSIS, ROUTINE W REFLEX MICROSCOPIC
Bilirubin Urine: NEGATIVE
Glucose, UA: NEGATIVE mg/dL
Hgb urine dipstick: NEGATIVE
Ketones, ur: NEGATIVE mg/dL
Leukocytes, UA: NEGATIVE
Nitrite: NEGATIVE
Protein, ur: NEGATIVE mg/dL
Specific Gravity, Urine: <1.005 — ABNORMAL LOW (ref 1.005–1.030)
Urobilinogen, UA: 0.2 mg/dL (ref 0.0–1.0)
pH: 7 (ref 5.0–8.0)

## 2012-05-17 MED ORDER — SODIUM CHLORIDE 0.9 % IV SOLN
1000.0000 mL | Freq: Once | INTRAVENOUS | Status: AC
  Administered 2012-05-17: 1000 mL via INTRAVENOUS

## 2012-05-17 NOTE — ED Notes (Signed)
Patient on the telephone getting a ride, states that "someone had better come pick her up and soon; they are not giving me anything for pain and are not doing anything for me; I am getting pissed".

## 2012-05-17 NOTE — Progress Notes (Signed)
RN called for update on fetal heart and uterine contractions. Report given and Joellyn Haff CNM reviewed strip. Fetal monitoring discontinued at this time

## 2012-05-17 NOTE — ED Provider Notes (Signed)
History     CSN: [id number]  Arrival date & time 05/17/12  0213   First MD Initiated Contact with Patient 05/17/12 0243      Chief Complaint  Patient presents with  . Back Pain    (Consider location/radiation/quality/duration/timing/severity/associated sxs/prior treatment) HPI  Connie [name] is a 21 y.o. female , G1 P0, [redacted] weeks pregnant  who presents to the Emergency Department complaining of back pain and contractions x 2 days that are irregular.The back pain is associated with nausea and will wrap around the lower abdomen. It makes her feel like she has to urinate and defecate at the same time.Denies fever, chills, vaginal discharge, bleeding. Placed on Toco on arrival.  PCP Dr. Janna Arch OB/GYN Dr. Emelda Fear  Past Medical History  Diagnosis Date  . GERD (gastroesophageal reflux disease)   . Bipolar 1 disorder   . Depression   . Panic attack   . Pregnant   . Headache     Past Surgical History  Procedure Date  . Tonsillectomy     History reviewed. No pertinent family history.  History  Substance Use Topics  . Smoking status: Former Smoker -- 0.5 packs/day    Types: Cigarettes    Quit date: 04/19/2012  . Smokeless tobacco: Not on file  . Alcohol Use: No    OB History    Grav Para Term Preterm Abortions TAB SAB Ect Mult Living   1 0 0 0 0 0 0 0 0 0       Review of Systems  Constitutional: Negative for fever.       10 Systems reviewed and are negative for acute change except as noted in the HPI.  HENT: Negative for congestion.   Eyes: Negative for discharge and redness.  Respiratory: Negative for cough and shortness of breath.   Cardiovascular: Negative for chest pain.  Gastrointestinal: Negative for vomiting and abdominal pain.  Genitourinary:       Contractions  Musculoskeletal: Positive for back pain.  Skin: Negative for rash.  Neurological: Negative for syncope, numbness and headaches.  Psychiatric/Behavioral:       No behavior change.     Allergies  Review of patient's allergies indicates no known allergies.  Home Medications   Current Outpatient Rx  Name  Route  Sig  Dispense  Refill  . ACETAMINOPHEN 500 MG PO TABS   Oral   Take 500 mg by mouth every 6 (six) hours as needed. For headache/pain         . BUTALBITAL-APAP-CAFFEINE 50-325-40 MG PO TABS   Oral   Take 1 tablet by mouth every 8 (eight) hours as needed. For headache         . HYDROCODONE-ACETAMINOPHEN 5-325 MG PO TABS   Oral   Take 1 tablet by mouth every 6 (six) hours as needed. For headaches.         Marland Kitchen HYDROXYZINE HCL 10 MG PO TABS   Oral   Take 1 tablet (10 mg total) by mouth 3 (three) times daily as needed for itching.   30 tablet   0   . OMEPRAZOLE 20 MG PO CPDR   Oral   Take 20 mg by mouth daily.           Marland Kitchen ONDANSETRON HCL 8 MG PO TABS   Oral   Take 8 mg by mouth every 8 (eight) hours as needed. For nausea and/or vomting         . PROMETHAZINE HCL 25 MG PO TABS  Oral   Take 0.5-1 tablets (12.5-25 mg total) by mouth every 6 (six) hours as needed for nausea.   30 tablet   0     BP 126/76  Pulse 80  Temp 98.4 F (36.9 C) (Oral)  Resp 24  Ht 5' (1.524 m)  Wt 192 lb (87.091 kg)  BMI 37.50 kg/m2  SpO2 98%  LMP 09/05/2011  Physical Exam  Nursing note and vitals reviewed. Constitutional: She is oriented to person, place, and time. She appears well-developed and well-nourished.       Awake, alert, nontoxic appearance.  HENT:  Head: Atraumatic.  Eyes: Right eye exhibits no discharge. Left eye exhibits no discharge.  Neck: Neck supple.  Cardiovascular: Normal heart sounds.   Pulmonary/Chest: Effort normal and breath sounds normal. She exhibits no tenderness.  Abdominal: Soft. There is no tenderness. There is no rebound.  Genitourinary:       Gravid, c/w dates On continuous TOCO  Musculoskeletal: Normal range of motion. She exhibits no tenderness.       Baseline ROM, no obvious new focal weakness.  Neurological:  She is alert and oriented to person, place, and time.       Mental status and motor strength appears baseline for patient and situation.  Skin: No rash noted.  Psychiatric: She has a normal mood and affect.    ED Course  Procedures (including critical care time) Results for orders placed during the hospital encounter of 05/17/12  URINALYSIS, ROUTINE W REFLEX MICROSCOPIC      Component Value Range   Color, Urine YELLOW  YELLOW   APPearance CLEAR  CLEAR   Specific Gravity, Urine <1.005 (*) 1.005 - 1.030   pH 7.0  5.0 - 8.0   Glucose, UA NEGATIVE  NEGATIVE mg/dL   Hgb urine dipstick NEGATIVE  NEGATIVE   Bilirubin Urine NEGATIVE  NEGATIVE   Ketones, ur NEGATIVE  NEGATIVE mg/dL   Protein, ur NEGATIVE  NEGATIVE mg/dL   Urobilinogen, UA 0.2  0.0 - 1.0 mg/dL   Nitrite NEGATIVE  NEGATIVE   Leukocytes, UA NEGATIVE  NEGATIVE    No results found.   1. Braxton Hicks contractions    0238 FHT 134-145, Some variability. Possible contractions without decelerations 0300 Report from [facility] on TOCO. They have seen a couple of contractions near the beginning of the TOCO strip with a little variablility, no decels, 0355 FHR 134, no contractions, no variability on TOCO MDM  Patient G1P0 34 weeks here with lowe back pain and contractions. TOCO with some initial variability which cleared with administration of IVF. UA negative for infection.Patient was offered tylenol for discomfort and was not pleased with the choice. She asked for narcotic analgesic as she is on hydrocodone. I declined. Once she had received her IVF and the TOCO showed a decrease in variability, she was cleared for discharge. Pt stable in ED with no significant deterioration in condition.The patient appears reasonably screened and/or stabilized for discharge and I doubt any other medical condition or other [facility] requiring further screening, evaluation, or treatment in the ED at this time prior to discharge.  MDM Reviewed: previous  chart, nursing note and vitals Reviewed previous: labs Interpretation: labs           Nicoletta Dress. Colon Branch, MD 05/17/12 [phone]

## 2012-05-17 NOTE — ED Notes (Signed)
Pt c/o right lower back pain. Pt states she has had pain for 2 days. Pt also states she is nauseated and having sharp pain to abd pt states this is normal for her pregnancy.

## 2012-06-19 ENCOUNTER — Inpatient Hospital Stay (HOSPITAL_COMMUNITY)
Admission: AD | Admit: 2012-06-19 | Discharge: 2012-06-19 | Disposition: A | Discharge status: Home / Self Care | Payer: Medicare Other | Source: Ambulatory Visit | Attending: Obstetrics & Gynecology | Admitting: Obstetrics & Gynecology

## 2012-06-19 ENCOUNTER — Encounter (HOSPITAL_COMMUNITY): Payer: Self-pay | Admitting: *Deleted

## 2012-06-19 DIAGNOSIS — O99891 Other specified diseases and conditions complicating pregnancy: Secondary | ICD-10-CM | POA: Insufficient documentation

## 2012-06-19 DIAGNOSIS — K5289 Other specified noninfective gastroenteritis and colitis: Secondary | ICD-10-CM

## 2012-06-19 DIAGNOSIS — O479 False labor, unspecified: Secondary | ICD-10-CM

## 2012-06-19 DIAGNOSIS — R109 Unspecified abdominal pain: Secondary | ICD-10-CM | POA: Insufficient documentation

## 2012-06-19 DIAGNOSIS — K529 Noninfective gastroenteritis and colitis, unspecified: Secondary | ICD-10-CM

## 2012-06-19 DIAGNOSIS — R197 Diarrhea, unspecified: Secondary | ICD-10-CM | POA: Insufficient documentation

## 2012-06-19 DIAGNOSIS — O212 Late vomiting of pregnancy: Secondary | ICD-10-CM | POA: Insufficient documentation

## 2012-06-19 DIAGNOSIS — M549 Dorsalgia, unspecified: Secondary | ICD-10-CM

## 2012-06-19 LAB — URINALYSIS, ROUTINE W REFLEX MICROSCOPIC
Bilirubin Urine: NEGATIVE
Glucose, UA: NEGATIVE mg/dL
Hgb urine dipstick: NEGATIVE
Ketones, ur: NEGATIVE mg/dL
Nitrite: NEGATIVE
Protein, ur: NEGATIVE mg/dL
Specific Gravity, Urine: 1.01 (ref 1.005–1.030)
Urobilinogen, UA: 0.2 mg/dL (ref 0.0–1.0)
pH: 6.5 (ref 5.0–8.0)

## 2012-06-19 LAB — WET PREP, GENITAL
Clue Cells Wet Prep HPF POC: NONE SEEN
Trich, Wet Prep: NONE SEEN
Yeast Wet Prep HPF POC: NONE SEEN

## 2012-06-19 LAB — URINE MICROSCOPIC-ADD ON

## 2012-06-19 MED ORDER — CYCLOBENZAPRINE HCL 10 MG PO TABS: 10.0000 mg | ORAL_TABLET | Freq: Three times a day (TID) | ORAL | Status: DC | PRN

## 2012-06-19 MED ORDER — CYCLOBENZAPRINE HCL 10 MG PO TABS
10.0000 mg | ORAL_TABLET | Freq: Once | ORAL | Status: AC
  Administered 2012-06-19: 10 mg via ORAL
  Filled 2012-06-19: qty 1

## 2012-06-19 MED ORDER — FLORANEX PO CHEW: 1.0000 | CHEWABLE_TABLET | Freq: Three times a day (TID) | ORAL | Status: DC

## 2012-06-19 MED ORDER — ACETAMINOPHEN 500 MG PO TABS
1000.0000 mg | ORAL_TABLET | Freq: Once | ORAL | Status: AC
  Administered 2012-06-19: 1000 mg via ORAL
  Filled 2012-06-19: qty 2

## 2012-06-19 MED ORDER — PSYLLIUM 28 % PO PACK: 1.0000 | PACK | Freq: Two times a day (BID) | ORAL | Status: DC

## 2012-06-19 MED ORDER — FAMOTIDINE 20 MG PO TABS: 20.0000 mg | ORAL_TABLET | Freq: Two times a day (BID) | ORAL | Status: DC

## 2012-06-19 NOTE — MAU Provider Note (Signed)
History     CSN: [id number]  Arrival date & time 06/19/12  1337   None     Chief Complaint  Patient presents with  . Nausea  . Emesis  . Diarrhea    (Consider location/radiation/quality/duration/timing/severity/associated sxs/prior treatment) HPI 21 y.o. G1P0000 at [redacted]w[redacted]d with nausea, vomiting and diarrhea for 1 week. Can't keep anything down except water. Diarrhea 7-8 times a day, watery. Vomiting several times a day. Low back pain, intermittent lower abdominal cramping. Sharp pain both sides of abdominal wall. No fever/chills. Was seen in outside hospital over a week ago with constipation x 7 days. Had enema. Finally had large stool evacuation with bleeding. Has had some blood in stool since that time but not now. States she has not been taking any laxatives or the medication she was prescribed on discharge because of the diarrhea. Prior to hospitalization she had been taking milk of magnesia. Taking phenergan for nausea but not working.   No vaginal bleeding, baby moving well today.    Past Medical History  Diagnosis Date  . GERD (gastroesophageal reflux disease)   . Bipolar 1 disorder   . Depression   . Panic attack   . Pregnant   . Headache     Past Surgical History  Procedure Date  . Tonsillectomy     History reviewed. No pertinent family history.  History  Substance Use Topics  . Smoking status: Former Smoker -- 0.5 packs/day    Types: Cigarettes    Quit date: 04/19/2012  . Smokeless tobacco: Not on file  . Alcohol Use: No    OB History    Grav Para Term Preterm Abortions TAB SAB Ect Mult Living   1 0 0 0 0 0 0 0 0 0       Review of Systems  Constitutional: Negative for fever and chills.  Eyes: Negative for visual disturbance.  Gastrointestinal: Positive for nausea, vomiting, abdominal pain, diarrhea and blood in stool.  Genitourinary: Positive for vaginal discharge (watery and some yellow). Negative for dysuria and vaginal bleeding.  Musculoskeletal:  Positive for back pain.    Allergies  Review of patient's allergies indicates no known allergies.  Home Medications  No current outpatient prescriptions on file.  BP 138/89  Pulse 114  Temp 97.2 F (36.2 C) (Oral)  Resp 16  LMP 09/05/2011  Physical Exam  Constitutional: She is oriented to person, place, and time.       Obese, no acute distress  HENT:  Head: Normocephalic and atraumatic.  Eyes: Conjunctivae normal and EOM are normal.  Neck: Neck supple.  Cardiovascular: Normal rate, regular rhythm and normal heart sounds.   Pulmonary/Chest: Effort normal and breath sounds normal. No respiratory distress.  Abdominal: Soft. Bowel sounds are normal. There is tenderness (lateral abdominal wall bilaterally, suprapubically). There is no rebound and no guarding.       No CVA tenderness.  Genitourinary:       Normal external genitalia, normal vagina, white/yellow discharge - thick and some mucous. Discomfort with speculum and digital exam. No visible fluid. Cervix 2/50/posterior/-3.   Musculoskeletal: Normal range of motion. She exhibits edema (trace). She exhibits no tenderness.  Neurological: She is alert and oriented to person, place, and time.  Skin: Skin is warm and dry.  Psychiatric: She has a normal mood and affect.   Fern Test negative    ED Course  Procedures (including critical care time)  FHTs:  125-130, moderate variability, accelerations present, no decelerations TOCO:  No contractions, irritability  Labs Reviewed  URINALYSIS, ROUTINE W REFLEX MICROSCOPIC  WET PREP, GENITAL  GC/CHLAMYDIA PROBE AMP   Results for orders placed during the hospital encounter of 06/19/12 (from the past 24 hour(s))  URINALYSIS, ROUTINE W REFLEX MICROSCOPIC     Status: Abnormal   Collection Time   06/19/12  2:00 PM      Component Value Range   Color, Urine YELLOW  YELLOW   APPearance HAZY (*) CLEAR   Specific Gravity, Urine 1.010  1.005 - 1.030   pH 6.5  5.0 - 8.0   Glucose,  UA NEGATIVE  NEGATIVE mg/dL   Hgb urine dipstick NEGATIVE  NEGATIVE   Bilirubin Urine NEGATIVE  NEGATIVE   Ketones, ur NEGATIVE  NEGATIVE mg/dL   Protein, ur NEGATIVE  NEGATIVE mg/dL   Urobilinogen, UA 0.2  0.0 - 1.0 mg/dL   Nitrite NEGATIVE  NEGATIVE   Leukocytes, UA MODERATE (*) NEGATIVE  URINE MICROSCOPIC-ADD ON     Status: Abnormal   Collection Time   06/19/12  2:00 PM      Component Value Range   Squamous Epithelial / LPF MANY (*) RARE   WBC, UA 7-10  <3 WBC/hpf   Bacteria, UA MANY (*) RARE   Urine-Other MUCOUS PRESENT    WET PREP, GENITAL     Status: Abnormal   Collection Time   06/19/12  2:26 PM      Component Value Range   Yeast Wet Prep HPF POC NONE SEEN  NONE SEEN   Trich, Wet Prep NONE SEEN  NONE SEEN   Clue Cells Wet Prep HPF POC NONE SEEN  NONE SEEN   WBC, Wet Prep HPF POC FEW (*) NONE SEEN    ASSESSMENT/PLAN 21 y.o. G1P0000 at [redacted]w[redacted]d with 1.  Nausea/vomiting/diarrhea:  Suspect gastroenteritis. Continue phenergan, add pepcid. Diarrhea may be result of use of milk of magnesia prior to stool disimpaction. Urine sent for culture. 2.  Leak of fluid: negative fern, negative wet prep 3.  Abdominal pain: no contractions, no cervical change, likely GI. Side pain is muscular.  4.  Back pain: pregnancy/obesity. Tylenol, flexeril.  Discharge home. Metamucil. Pepcid. Phenergan. F/U as scheduled for prenatal care.  Napoleon Form, MD 06/19/2012 6:21 PM

## 2012-06-19 NOTE — MAU Note (Signed)
Pt states she has had nausea, vomiting and diarrhea for at least a week and is getting weak. States she is not able to keep anything down bu mouth

## 2012-06-20 LAB — URINE CULTURE
Colony Count: NO GROWTH
Culture: NO GROWTH

## 2012-06-23 ENCOUNTER — Encounter (HOSPITAL_COMMUNITY): Payer: Self-pay | Admitting: *Deleted

## 2012-06-23 ENCOUNTER — Inpatient Hospital Stay (HOSPITAL_COMMUNITY)
Admission: AD | Admit: 2012-06-23 | Discharge: 2012-06-23 | Disposition: A | Discharge status: Home / Self Care | Payer: Medicare Other | Source: Ambulatory Visit | Attending: Obstetrics & Gynecology | Admitting: Obstetrics & Gynecology

## 2012-06-23 DIAGNOSIS — O479 False labor, unspecified: Secondary | ICD-10-CM

## 2012-06-23 DIAGNOSIS — O471 False labor at or after 37 completed weeks of gestation: Secondary | ICD-10-CM

## 2012-06-23 HISTORY — DX: Carpal tunnel syndrome, bilateral upper limbs: G56.03

## 2012-06-23 HISTORY — DX: Calculus of kidney: N20.0

## 2012-06-23 HISTORY — DX: Urinary tract infection, site not specified: N39.0

## 2012-06-23 LAB — GC/CHLAMYDIA PROBE AMP
CT Probe RNA: NEGATIVE
GC Probe RNA: NEGATIVE

## 2012-06-23 MED ORDER — NALBUPHINE HCL 10 MG/ML IJ SOLN: 10.0000 mg | INTRAMUSCULAR | Status: DC | PRN

## 2012-06-23 MED ORDER — NALBUPHINE HCL 10 MG/ML IJ SOLN
10.0000 mg | INTRAMUSCULAR | Status: DC | PRN
  Administered 2012-06-23: 10 mg via INTRAMUSCULAR
  Filled 2012-06-23: qty 1

## 2012-06-23 MED ORDER — OXYCODONE-ACETAMINOPHEN 7.5-325 MG PO TABS: 1.0000 | ORAL_TABLET | ORAL | Status: DC | PRN

## 2012-06-23 MED ORDER — GI COCKTAIL ~~LOC~~
30.0000 mL | Freq: Once | ORAL | Status: AC
  Administered 2012-06-23: 30 mL via ORAL
  Filled 2012-06-23: qty 30

## 2012-06-23 MED ORDER — FENTANYL CITRATE 0.05 MG/ML IJ SOLN
100.0000 ug | Freq: Once | INTRAMUSCULAR | Status: AC
  Administered 2012-06-23: 100 ug via INTRAMUSCULAR
  Filled 2012-06-23: qty 2

## 2012-06-23 NOTE — MAU Provider Note (Signed)
  History     CSN: [id number]  Arrival date and time: 06/23/12 1258   None     Chief Complaint  Patient presents with  . Labor Eval   HPI  Connie [name] is 20yo, G1P0 with GA [redacted]w[redacted]d who presents with contractions. Pt reports contractions since last night, every 5-7 minutes, she denies vaginal discharge, and bleeding. Good fetal movements.  Past Medical History  Diagnosis Date  . GERD (gastroesophageal reflux disease)   . Bipolar 1 disorder   . Depression   . Panic attack   . Pregnant   . Headache   . Urinary tract infection   . Kidney stones   . Carpal tunnel syndrome on both sides     with preg    Past Surgical History  Procedure Date  . Tonsillectomy     Family History  Problem Relation Age of Onset  . Other Neg Hx     History  Substance Use Topics  . Smoking status: Former Smoker -- 0.5 packs/day    Types: Cigarettes    Quit date: 04/19/2012  . Smokeless tobacco: Not on file  . Alcohol Use: No    Allergies: No Known Allergies  Prescriptions prior to admission  Medication Sig Dispense Refill  . calcium carbonate (TUMS - DOSED IN MG ELEMENTAL CALCIUM) 500 MG chewable tablet Chew 2 tablets by mouth 2 (two) times daily as needed. For heartburn      . cyclobenzaprine (FLEXERIL) 10 MG tablet Take 1 tablet (10 mg total) by mouth 3 (three) times daily as needed for muscle spasms.  15 tablet  0  . famotidine (PEPCID) 20 MG tablet Take 1 tablet (20 mg total) by mouth 2 (two) times daily.  60 tablet  0  . promethazine (PHENERGAN) 25 MG tablet Take 0.5-1 tablets (12.5-25 mg total) by mouth every 6 (six) hours as needed for nausea.  30 tablet  0  . psyllium (METAMUCIL SMOOTH TEXTURE) 28 % packet Take 1 packet by mouth 2 (two) times daily.  30 packet  0    Review of Systems  Constitutional: Negative for fever.  Eyes: Negative for blurred vision.  Respiratory: Negative for cough and shortness of breath.   Cardiovascular: Negative for chest pain.   Gastrointestinal: Negative for nausea and vomiting.  Neurological: Negative for headaches.   Physical Exam   Blood pressure 150/88, pulse 97, temperature 97.8 F (36.6 C), temperature source Oral, resp. rate 22, last menstrual period 09/05/2011.  Physical Exam  Constitutional: She appears well-developed.  HENT:  Head: Normocephalic.  Cardiovascular: Normal rate.   Respiratory: Effort normal.  Dilation: 1.5 Effacement (%): 60 Cervical Position: Middle Station: -3 Exam by:: Dr Chelsea Aus FHT: baseline 140,moderate variability, no decels, accels presents,  Toco: Contractions Q39min, irregular. MAU Course  Procedures  MDM Pain meds  Assessment and Plan  Connie [name] is 20yo, G1P0 with GA [redacted]w[redacted]d who presents with contractions. Labor precautions discussed. Pain meds Return if symptoms worsen. Pt is stable to be discharge. Governor Specking 06/23/2012, 1:36 PM   I have seen and examined patient and agree with above. Likely early labor but no cervical change yet. I reviewed NST, which was reactive. Napoleon Form, MD Napoleon Form, MD

## 2012-06-23 NOTE — MAU Note (Signed)
Contractions started last night, got more regular last night.  Checked in office this morning- was 2.  Brownish d/c.  No leaking..  Feeling pressure- brought to rm and checked before going to restroom

## 2012-06-25 ENCOUNTER — Inpatient Hospital Stay (HOSPITAL_COMMUNITY)
Admission: AD | Admit: 2012-06-25 | Discharge: 2012-06-25 | Disposition: A | Discharge status: Home / Self Care | Payer: Medicare Other | Source: Ambulatory Visit | Attending: Obstetrics & Gynecology | Admitting: Obstetrics & Gynecology

## 2012-06-25 ENCOUNTER — Encounter (HOSPITAL_COMMUNITY): Payer: Self-pay | Admitting: *Deleted

## 2012-06-25 DIAGNOSIS — F319 Bipolar disorder, unspecified: Secondary | ICD-10-CM

## 2012-06-25 DIAGNOSIS — O479 False labor, unspecified: Secondary | ICD-10-CM | POA: Insufficient documentation

## 2012-06-25 DIAGNOSIS — O99340 Other mental disorders complicating pregnancy, unspecified trimester: Secondary | ICD-10-CM | POA: Insufficient documentation

## 2012-06-25 HISTORY — DX: Bipolar disorder, unspecified: F31.9

## 2012-06-25 MED ORDER — ONDANSETRON HCL 4 MG PO TABS
4.0000 mg | ORAL_TABLET | Freq: Once | ORAL | Status: AC
  Administered 2012-06-25: 4 mg via ORAL
  Filled 2012-06-25: qty 1

## 2012-06-25 MED ORDER — ACETAMINOPHEN 500 MG PO TABS
1000.0000 mg | ORAL_TABLET | Freq: Once | ORAL | Status: AC
  Administered 2012-06-25: 1000 mg via ORAL
  Filled 2012-06-25: qty 2

## 2012-06-25 MED ORDER — GI COCKTAIL ~~LOC~~
30.0000 mL | Freq: Once | ORAL | Status: AC
  Administered 2012-06-25: 30 mL via ORAL
  Filled 2012-06-25: qty 30

## 2012-06-25 MED ORDER — OXYCODONE-ACETAMINOPHEN 5-325 MG PO TABS
1.0000 | ORAL_TABLET | Freq: Once | ORAL | Status: AC
  Administered 2012-06-25: 1 via ORAL
  Filled 2012-06-25: qty 1

## 2012-06-25 NOTE — MAU Note (Signed)
Left angry slamming doors and pouting; states that she is going to "flip-out on somebody" as she stomps out of MAU;

## 2012-06-25 NOTE — MAU Provider Note (Signed)
  History     CSN: [id number]  Arrival date and time: 06/25/12 1147   None     Chief Complaint  Patient presents with  . Labor Eval   HPI Connie Connie [name] is 21yo, G1P0 with GA [redacted]w[redacted]d who presents with c/o " my bipolar is uncontroled" Pt states she is scare to lose control, she can't sleep, can't eat due nausea. She is not taking any medication for bipolar at this point. Pt denies contractions, she denies vaginal discharge, and bleeding.  Good fetal movements.    Past Medical History  Diagnosis Date  . GERD (gastroesophageal reflux disease)   . Bipolar 1 disorder   . Depression   . Panic attack   . Pregnant   . Headache   . Urinary tract infection   . Kidney stones   . Carpal tunnel syndrome on both sides     with preg  . Bipolar disorder     Past Surgical History  Procedure Date  . Tonsillectomy     Family History  Problem Relation Age of Onset  . Other Neg Hx   . Hypertension Mother   . Hypertension Father   . Hypertension Maternal Aunt   . Hypertension Maternal Uncle   . Hypertension Paternal Aunt   . Hypertension Paternal Uncle   . Diabetes Maternal Grandfather     History  Substance Use Topics  . Smoking status: Former Smoker -- 0.5 packs/day    Types: Cigarettes    Quit date: 04/19/2012  . Smokeless tobacco: Not on file  . Alcohol Use: No    Allergies: No Known Allergies  Prescriptions prior to admission  Medication Sig Dispense Refill  . calcium carbonate (TUMS - DOSED IN MG ELEMENTAL CALCIUM) 500 MG chewable tablet Chew 2 tablets by mouth 2 (two) times daily as needed. For heartburn      . cyclobenzaprine (FLEXERIL) 10 MG tablet Take 1 tablet (10 mg total) by mouth 3 (three) times daily as needed for muscle spasms.  15 tablet  0  . famotidine (PEPCID) 20 MG tablet Take 1 tablet (20 mg total) by mouth 2 (two) times daily.  60 tablet  0  . oxyCODONE-acetaminophen (PERCOCET) 7.5-325 MG per tablet Take 1 tablet by mouth every 4 (four) hours as  needed for pain.  10 tablet  0  . promethazine (PHENERGAN) 25 MG tablet Take 0.5-1 tablets (12.5-25 mg total) by mouth every 6 (six) hours as needed for nausea.  30 tablet  0  . psyllium (METAMUCIL SMOOTH TEXTURE) 28 % packet Take 1 packet by mouth 2 (two) times daily.  30 packet  0    Review of Systems  Constitutional: Negative for fever.  Eyes: Negative for blurred vision.  Respiratory: Negative for shortness of breath.   Cardiovascular: Negative for palpitations.  Gastrointestinal: Positive for heartburn and nausea. Negative for vomiting.  Neurological: Positive for headaches.   Physical Exam   Blood pressure 149/82, pulse 110, temperature 98 F (36.7 C), temperature source Oral, resp. rate 18, height 5' (1.524 m), weight 88.905 kg (196 lb), last menstrual period 09/05/2011.  Physical Exam  Constitutional: She appears well-developed.  Neck: Normal range of motion.  Cardiovascular: Normal rate and regular rhythm.     MAU Course  Procedures  MDM GI cocktail, Zofran Obs  Assessment and Plan  Connie [name] is 21yo, G1P0 with GA [redacted]w[redacted]d Pain controled with med Patient stable to be discharge.  Governor Specking 06/25/2012, 12:31 PM

## 2012-06-25 NOTE — MAU Provider Note (Signed)
Attestation of Attending Supervision of Obstetric Fellow: Evaluation and management procedures were performed by the Obstetric Fellow under my supervision and collaboration.  I have reviewed the Obstetric Fellow's note and chart, and I agree with the management and plan.  Arvis Miguez, MD, FACOG Attending Obstetrician & Gynecologist Faculty Practice, [facility]   

## 2012-06-26 ENCOUNTER — Encounter (HOSPITAL_COMMUNITY): Payer: Self-pay | Admitting: *Deleted

## 2012-06-26 ENCOUNTER — Telehealth (HOSPITAL_COMMUNITY): Payer: Self-pay | Admitting: *Deleted

## 2012-06-26 NOTE — H&P (Signed)
Subjective:  Connie [name] is a 21 y.o. G1 P0 female with EDC 06/23/2012 at 41 and 0/[redacted] weeks gestation who is being admitted for induction of labor.  Her current obstetrical history is significant for postdates pregnancy, as well as bipolar disorder followed through [facility] behavioral health in [place]. She was treated early in the pregnancy with multiple antipsychotic medications including risperidone,Lamotrigine 50 hs, and Surmontil , and Oxycontazepine(?sp), with all medications allegedly stopped by Psychiatrist during pregnancy. She has done actually quite well off medications during the pregnancy, but is having difficulty tolerating the prodromal symptoms of the last few days of pregnancy.  .  Patient reports contractions since 12/22.   Fetal Movement: normal.   NST done 12/27 is reactive Objective:   Vital signs in last 24 hours:     General:   alert, cooperative, distracted, fatigued and no distress  Skin:   normal  HEENT:  PERRLA  Lungs:   clear to auscultation bilaterally  Heart:   regular rate and rhythm  Breasts:     Abdomen:  nontender , gravid uterus 40 cm FH  Pelvis:  External genitalia: normal general appearance Vaginal: normal mucosa without prolapse or lesions Cervix: 2-3/50%/-2, mmidposition, vertex  FHT:  145 BPM  Uterine Size: size equals dates  Presentations: cephalic  Cervix:    Dilation: 2cm   Effacement: 50%   Station:  -2   Consistency: firm   Position: middle   Lab Review  A, Rh+  AFP:NML  One hour GTT: Normal    Assessment/Plan:  41 and 0/[redacted] weeks gestation. Not in labor. Obstetrical history significant for Bipolar disorder.     Risks, benefits, alternatives and possible complications have been discussed in detail with the patient.  Pre-admission, admission, and post admission procedures and expectations were discussed in detail.  All questions answered, all appropriate consents will be signed at the Hospital. Admission is planned for  06/30/2012 at 7:30 am..  Anticipate vaginal delivery.

## 2012-06-26 NOTE — MAU Provider Note (Signed)
Offered continued evaluation and treatment for multiple complaints. Pt stated that if we will not induce her, she wants to go home Reiterated risk of C/S if we induce before cervix ready. Recommend continued conservative care Did accept a Percocet for her back pain.  Wynelle Bourgeois CNM

## 2012-06-26 NOTE — Telephone Encounter (Signed)
Preadmission screen  

## 2012-06-28 ENCOUNTER — Other Ambulatory Visit: Payer: Self-pay | Admitting: Obstetrics and Gynecology

## 2012-06-30 ENCOUNTER — Encounter (HOSPITAL_COMMUNITY): Payer: Self-pay

## 2012-06-30 ENCOUNTER — Inpatient Hospital Stay (HOSPITAL_COMMUNITY): Payer: Medicare Other | Admitting: Anesthesiology

## 2012-06-30 ENCOUNTER — Encounter (HOSPITAL_COMMUNITY): Payer: Self-pay | Admitting: Anesthesiology

## 2012-06-30 ENCOUNTER — Inpatient Hospital Stay (HOSPITAL_COMMUNITY)
Admission: RE | Admit: 2012-06-30 | Discharge: 2012-07-02 | DRG: 775 | Disposition: A | Discharge status: Home / Self Care | Payer: Medicare Other | Source: Ambulatory Visit | Attending: Obstetrics & Gynecology | Admitting: Obstetrics & Gynecology

## 2012-06-30 DIAGNOSIS — F319 Bipolar disorder, unspecified: Secondary | ICD-10-CM | POA: Diagnosis present

## 2012-06-30 DIAGNOSIS — F1910 Other psychoactive substance abuse, uncomplicated: Secondary | ICD-10-CM | POA: Diagnosis present

## 2012-06-30 DIAGNOSIS — IMO0001 Reserved for inherently not codable concepts without codable children: Secondary | ICD-10-CM

## 2012-06-30 DIAGNOSIS — F411 Generalized anxiety disorder: Secondary | ICD-10-CM | POA: Diagnosis present

## 2012-06-30 DIAGNOSIS — R030 Elevated blood-pressure reading, without diagnosis of hypertension: Secondary | ICD-10-CM | POA: Diagnosis present

## 2012-06-30 DIAGNOSIS — O99892 Other specified diseases and conditions complicating childbirth: Secondary | ICD-10-CM | POA: Diagnosis present

## 2012-06-30 DIAGNOSIS — O99344 Other mental disorders complicating childbirth: Secondary | ICD-10-CM | POA: Diagnosis present

## 2012-06-30 DIAGNOSIS — O480 Post-term pregnancy: Principal | ICD-10-CM | POA: Diagnosis present

## 2012-06-30 LAB — COMPREHENSIVE METABOLIC PANEL
ALT: 8 U/L (ref 0–35)
AST: 23 U/L (ref 0–37)
Albumin: 2.9 g/dL — ABNORMAL LOW (ref 3.5–5.2)
Alkaline Phosphatase: 286 U/L — ABNORMAL HIGH (ref 39–117)
BUN: 6 mg/dL (ref 6–23)
CO2: 17 mEq/L — ABNORMAL LOW (ref 19–32)
Calcium: 9 mg/dL (ref 8.4–10.5)
Chloride: 101 mEq/L (ref 96–112)
Creatinine, Ser: 0.64 mg/dL (ref 0.50–1.10)
GFR calc Af Amer: >90 mL/min (ref >90)
GFR calc non Af Amer: >90 mL/min (ref >90)
Glucose, Bld: 80 mg/dL (ref 70–99)
Potassium: 4 mEq/L (ref 3.5–5.1)
Sodium: 134 mEq/L — ABNORMAL LOW (ref 135–145)
Total Bilirubin: 0.3 mg/dL (ref 0.3–1.2)
Total Protein: 5.9 g/dL — ABNORMAL LOW (ref 6.0–8.3)

## 2012-06-30 LAB — URINE MICROSCOPIC-ADD ON

## 2012-06-30 LAB — OB RESULTS CONSOLE HIV ANTIBODY (ROUTINE TESTING): HIV: NONREACTIVE

## 2012-06-30 LAB — PROTEIN / CREATININE RATIO, URINE
Creatinine, Urine: 68.04 mg/dL
Protein Creatinine Ratio: 0.22 — ABNORMAL HIGH (ref 0.00–0.15)
Total Protein, Urine: 15 mg/dL

## 2012-06-30 LAB — ABO/RH: ABO/RH(D): A POS

## 2012-06-30 LAB — CBC
HCT: 34.1 % — ABNORMAL LOW (ref 36.0–46.0)
Hemoglobin: 11.2 g/dL — ABNORMAL LOW (ref 12.0–15.0)
MCH: 29.1 pg (ref 26.0–34.0)
MCHC: 32.8 g/dL (ref 30.0–36.0)
MCV: 88.6 fL (ref 78.0–100.0)
Platelets: 335 10*3/uL (ref 150–400)
RBC: 3.85 MIL/uL — ABNORMAL LOW (ref 3.87–5.11)
RDW: 14.6 % (ref 11.5–15.5)
WBC: 11.9 10*3/uL — ABNORMAL HIGH (ref 4.0–10.5)

## 2012-06-30 LAB — URINALYSIS, ROUTINE W REFLEX MICROSCOPIC
Bilirubin Urine: NEGATIVE
Glucose, UA: NEGATIVE mg/dL
Ketones, ur: 15 mg/dL — AB
Nitrite: NEGATIVE
Protein, ur: NEGATIVE mg/dL
Specific Gravity, Urine: 1.01 (ref 1.005–1.030)
Urobilinogen, UA: 0.2 mg/dL (ref 0.0–1.0)
pH: 6 (ref 5.0–8.0)

## 2012-06-30 LAB — OB RESULTS CONSOLE GBS: GBS: NEGATIVE

## 2012-06-30 LAB — OB RESULTS CONSOLE HEPATITIS B SURFACE ANTIGEN: Hepatitis B Surface Ag: NEGATIVE

## 2012-06-30 LAB — OB RESULTS CONSOLE ANTIBODY SCREEN: Antibody Screen: NEGATIVE

## 2012-06-30 LAB — RPR: RPR Ser Ql: NONREACTIVE

## 2012-06-30 LAB — OB RESULTS CONSOLE RUBELLA ANTIBODY, IGM: Rubella: IMMUNE

## 2012-06-30 LAB — TYPE AND SCREEN
ABO/RH(D): A POS
Antibody Screen: NEGATIVE

## 2012-06-30 LAB — OB RESULTS CONSOLE RPR: RPR: NONREACTIVE

## 2012-06-30 MED ORDER — PHENYLEPHRINE 40 MCG/ML (10ML) SYRINGE FOR IV PUSH (FOR BLOOD PRESSURE SUPPORT)
80.0000 ug | PREFILLED_SYRINGE | INTRAVENOUS | Status: DC | PRN
  Filled 2012-06-30: qty 5

## 2012-06-30 MED ORDER — SODIUM BICARBONATE 8.4 % IV SOLN
INTRAVENOUS | Status: DC | PRN
  Administered 2012-06-30: 5 mL via EPIDURAL

## 2012-06-30 MED ORDER — OXYTOCIN 40 UNITS IN LACTATED RINGERS INFUSION - SIMPLE MED
1.0000 m[IU]/min | INTRAVENOUS | Status: DC
Start: 2012-06-30 — End: 2012-06-30

## 2012-06-30 MED ORDER — OXYCODONE-ACETAMINOPHEN 5-325 MG PO TABS: 1.0000 | ORAL_TABLET | ORAL | Status: DC | PRN

## 2012-06-30 MED ORDER — NALBUPHINE SYRINGE 5 MG/0.5 ML
5.0000 mg | INJECTION | INTRAMUSCULAR | Status: DC | PRN
  Administered 2012-06-30: 5 mg via INTRAVENOUS
  Filled 2012-06-30 (×2): qty 0.5

## 2012-06-30 MED ORDER — CITRIC ACID-SODIUM CITRATE 334-500 MG/5ML PO SOLN
30.0000 mL | ORAL | Status: DC | PRN
  Administered 2012-06-30: 30 mL via ORAL
  Filled 2012-06-30: qty 15

## 2012-06-30 MED ORDER — FENTANYL 2.5 MCG/ML BUPIVACAINE 1/10 % EPIDURAL INFUSION (WH - ANES)
INTRAMUSCULAR | Status: DC | PRN
  Administered 2012-06-30: 12 mL/h via EPIDURAL

## 2012-06-30 MED ORDER — IBUPROFEN 600 MG PO TABS
600.0000 mg | ORAL_TABLET | Freq: Four times a day (QID) | ORAL | Status: DC | PRN
  Administered 2012-07-01: 600 mg via ORAL
  Filled 2012-06-30: qty 1

## 2012-06-30 MED ORDER — FENTANYL CITRATE 0.05 MG/ML IJ SOLN
50.0000 ug | Freq: Once | INTRAMUSCULAR | Status: AC
  Administered 2012-06-30: 50 ug via INTRAVENOUS
  Filled 2012-06-30: qty 2

## 2012-06-30 MED ORDER — PHENYLEPHRINE 40 MCG/ML (10ML) SYRINGE FOR IV PUSH (FOR BLOOD PRESSURE SUPPORT): 80.0000 ug | PREFILLED_SYRINGE | INTRAVENOUS | Status: DC | PRN

## 2012-06-30 MED ORDER — EPHEDRINE 5 MG/ML INJ: 10.0000 mg | INTRAVENOUS | Status: DC | PRN

## 2012-06-30 MED ORDER — OXYTOCIN BOLUS FROM INFUSION
500.0000 mL | INTRAVENOUS | Status: DC
  Administered 2012-06-30: 500 mL via INTRAVENOUS

## 2012-06-30 MED ORDER — ACETAMINOPHEN 325 MG PO TABS
650.0000 mg | ORAL_TABLET | ORAL | Status: DC | PRN
  Administered 2012-06-30: 650 mg via ORAL
  Filled 2012-06-30: qty 2

## 2012-06-30 MED ORDER — LIDOCAINE HCL (PF) 1 % IJ SOLN
30.0000 mL | INTRAMUSCULAR | Status: DC | PRN
  Filled 2012-06-30: qty 30

## 2012-06-30 MED ORDER — LACTATED RINGERS IV SOLN
500.0000 mL | Freq: Once | INTRAVENOUS | Status: AC
  Administered 2012-06-30: 250 mL via INTRAVENOUS

## 2012-06-30 MED ORDER — FENTANYL 2.5 MCG/ML BUPIVACAINE 1/10 % EPIDURAL INFUSION (WH - ANES)
14.0000 mL/h | INTRAMUSCULAR | Status: DC
  Administered 2012-06-30: 14 mL/h via EPIDURAL
  Filled 2012-06-30 (×2): qty 125

## 2012-06-30 MED ORDER — ONDANSETRON HCL 4 MG/2ML IJ SOLN: 4.0000 mg | Freq: Four times a day (QID) | INTRAMUSCULAR | Status: DC | PRN

## 2012-06-30 MED ORDER — DIPHENHYDRAMINE HCL 50 MG/ML IJ SOLN: 12.5000 mg | INTRAMUSCULAR | Status: DC | PRN

## 2012-06-30 MED ORDER — HYDROXYZINE HCL 50 MG PO TABS
50.0000 mg | ORAL_TABLET | Freq: Four times a day (QID) | ORAL | Status: DC | PRN
  Administered 2012-06-30 – 2012-07-01 (×2): 50 mg via ORAL
  Filled 2012-06-30 (×3): qty 1

## 2012-06-30 MED ORDER — EPHEDRINE 5 MG/ML INJ
10.0000 mg | INTRAVENOUS | Status: DC | PRN
  Filled 2012-06-30: qty 4

## 2012-06-30 MED ORDER — LIDOCAINE HCL (PF) 1 % IJ SOLN
INTRAMUSCULAR | Status: DC | PRN
  Administered 2012-06-30 (×2): 4 mL

## 2012-06-30 MED ORDER — LACTATED RINGERS IV SOLN
INTRAVENOUS | Status: DC
  Administered 2012-06-30: 13:00:00 via INTRAVENOUS
  Administered 2012-06-30: 125 mL via INTRAVENOUS
  Administered 2012-06-30 (×2): via INTRAVENOUS

## 2012-06-30 MED ORDER — OXYTOCIN 40 UNITS IN LACTATED RINGERS INFUSION - SIMPLE MED
1.0000 m[IU]/min | INTRAVENOUS | Status: DC
  Administered 2012-06-30: 2 m[IU]/min via INTRAVENOUS
  Filled 2012-06-30: qty 1000

## 2012-06-30 MED ORDER — FAMOTIDINE IN NACL 20-0.9 MG/50ML-% IV SOLN
20.0000 mg | Freq: Once | INTRAVENOUS | Status: AC
  Administered 2012-06-30: 20 mg via INTRAVENOUS
  Filled 2012-06-30: qty 50

## 2012-06-30 MED ORDER — OXYTOCIN 40 UNITS IN LACTATED RINGERS INFUSION - SIMPLE MED: 62.5000 mL/h | INTRAVENOUS | Status: DC

## 2012-06-30 NOTE — H&P (Signed)
Connie [name] is a 21 y.o. female presenting for IOL. Maternal Medical History:  Reason for admission: Reason for admission: nausea.  G1 at [redacted]w[redacted]d with favorable cx, bipolar d/o, borderline elevated SBP. She H&P of 06/26/12 by Dr. Emelda Fear. No hx genital HSV.   Contractions: Onset was 2 days ago.   Frequency: rare.   Perceived severity is mild.    Fetal activity: Perceived fetal activity is normal.   Last perceived fetal movement was within the past hour.    Prenatal Complications - Diabetes: none.    OB History    Grav Para Term Preterm Abortions TAB SAB Ect Mult Living   1 0 0 0 0 0 0 0 0 0      Past Medical History  Diagnosis Date  . GERD (gastroesophageal reflux disease)   . Bipolar 1 disorder   . Depression   . Panic attack   . 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 Headache   . Urinary tract infection   . Kidney stones   . Carpal tunnel syndrome on both sides     with preg  . Bipolar disorder    Past Surgical History  Procedure Date  . Tonsillectomy   . Myringotomy    Family History: family history includes Diabetes in her maternal grandfather and Hypertension in her father, maternal aunt, maternal uncle, mother, paternal aunt, and paternal uncle.  There is no history of Other. Social History:  reports that she quit smoking about 2 months ago. Her smoking use included Cigarettes. She smoked .5 packs per day. She has never used smokeless tobacco. She reports that she does not drink alcohol or use illicit drugs.   Prenatal Transfer Tool  Maternal Diabetes: No Genetic Screening: Normal Maternal Ultrasounds/Referrals: Normal Fetal Ultrasounds or other Referrals:  None Maternal Substance Abuse:  No Significant Maternal Medications:  None Significant Maternal Lab Results:  Lab values include: Group B Strep negative Other Comments:  None  Review of Systems  Constitutional: Negative for fever and malaise/fatigue.  Eyes: Negative for blurred  vision.  Gastrointestinal: Positive for heartburn and nausea. Negative for vomiting and abdominal pain.       Hemorrhoids painful  Genitourinary: Negative for dysuria.  Neurological: Negative for headaches.  Psychiatric/Behavioral: Negative for depression, suicidal ideas and substance abuse. The patient is nervous/anxious.     Dilation: 3 Effacement (%): 80 Exam by:: D. Janeva Peaster CNM Blood pressure 124/81, pulse 93, temperature 98.2 F (36.8 C), temperature source Oral, resp. rate 20, height 5' (1.524 m), weight 190 lb (86.183 kg), last menstrual period 09/05/2011. Maternal Exam:  Uterine Assessment: Contraction strength is mild.  Contraction frequency is rare.   Abdomen: Estimated fetal weight is 6-61/2#.   Fetal presentation: vertex  Introitus: Normal vulva. Normal vagina.  Vagina is negative for discharge.  Amniotic fluid character: not assessed.  Pelvis: adequate for delivery.   Cervix: Cervix evaluated by digital exam.   3/80/-2 vtx  Fetal Exam Fetal Monitor Review: Mode: ultrasound.   Baseline rate: 145.  Variability: moderate (6-25 bpm).   Pattern: accelerations present and no decelerations.    Fetal State Assessment: Category I - tracings are normal.     Physical Exam  Vitals reviewed. Constitutional: She is oriented to person, place, and time. She appears well-developed.       Short stature  HENT:  Head: Normocephalic.  Eyes: Pupils are equal, round, and reactive to light.  Neck: Normal range of motion.  Cardiovascular: Normal rate, regular  rhythm and normal heart sounds.   Respiratory: Effort normal and breath sounds normal.  GI: Soft. There is no tenderness.  Genitourinary: Vagina normal. No vaginal discharge found.  Musculoskeletal: Normal range of motion.  Neurological: She is alert and oriented to person, place, and time. She has normal reflexes.  Skin: Skin is warm and dry.  Psychiatric: She has a normal mood and affect. Her behavior is normal. Judgment  and thought content normal.       anxious    Filed Vitals:   06/30/12 0850  BP: 124/81  Pulse: 93  Temp:   Resp: 20   Prenatal labs: ABO, Rh: --/--/A POS (07/05 1309) Antibody: Negative (12/31 0000) Rubella: Immune (12/31 0000) RPR: Nonreactive (12/31 0000)  HBsAg: Negative (12/31 0000)  HIV: Non-reactive (12/31 0000)  GBS: Negative (12/31 0000)  2 hr: 09-811-914  Assessment/Plan: 21 yo G1 at [redacted]w[redacted]d for IOL indicated by late term, anxiety/bipolar disorder Cx favorable> start pitocin IOL    Connie [name] 06/30/2012, 9:04 AM

## 2012-06-30 NOTE — Progress Notes (Signed)
This note also relates to the following rows which could not be included: Pulse Rate - Cannot attach notes to unvalidated device data SpO2 - Cannot attach notes to unvalidated device data   Pt having panic attack, hyperventilating, RN trying to reassure and calm pt, pt not responding.  Bernita Buffy, CNM notified.  Suggests paper bag breathing.

## 2012-06-30 NOTE — Progress Notes (Signed)
Connie [name] is a 21 y.o. G1P0000 at [redacted]w[redacted]d in active labor.  Subjective:   Objective: BP 143/80  Pulse 109  Temp 97.7 F (36.5 C) (Oral)  Resp 20  Ht 5' (1.524 m)  Wt 190 lb (86.183 kg)  BMI 37.11 kg/m2  SpO2 100%  LMP 09/05/2011      FHT:  FHR: 120 bpm, variability: moderate,  accelerations:  Present,  decelerations:  Present variables with spontaneous return to baseline. UC:   regular, every 2-4 minutes SVE:   10/100/-1 Labs: Lab Results  Component Value Date   WBC 11.9* 06/30/2012   HGB 11.2* 06/30/2012   HCT 34.1* 06/30/2012   MCV 88.6 06/30/2012   PLT 335 06/30/2012    Assessment / Plan: Induction of labor due to postterm,  progressing well on pitocin  Labor: Progressing normally Preeclampsia:  no signs or symptoms of toxicity Fetal Wellbeing:  Category II Pain Control:  Epidural I/D:  n/a Anticipated MOD:  NSVD  Toua Stites H. 06/30/2012, 9:00 PM

## 2012-06-30 NOTE — Anesthesia Preprocedure Evaluation (Addendum)
Anesthesia Evaluation  Patient identified by MRN, date of birth, ID band Patient awake    Reviewed: Allergy & Precautions, H&P , Patient's Chart, lab work & pertinent test results  Airway Mallampati: III TM Distance: >3 FB Neck ROM: full    Dental No notable dental hx. (+) Teeth Intact   Pulmonary neg pulmonary ROS, former smoker,  breath sounds clear to auscultation  Pulmonary exam normal       Cardiovascular negative cardio ROS  Rhythm:regular Rate:Normal     Neuro/Psych  Headaches, PSYCHIATRIC DISORDERS Anxiety Depression Bipolar Disorder Panic Attacks Neuromuscular disease negative neurological ROS  negative psych ROS   GI/Hepatic negative GI ROS, Neg liver ROS, GERD-  Medicated and Controlled,  Endo/Other  Morbid obesity  Renal/GU Renal diseasenegative Renal ROS  negative genitourinary   Musculoskeletal   Abdominal Normal abdominal exam  (+) + obese,   Peds  Hematology negative hematology ROS (+)   Anesthesia Other Findings Pierced tongue and nose  Reproductive/Obstetrics (+) Pregnancy HSV                          Anesthesia Physical Anesthesia Plan  ASA: III  Anesthesia Plan: Epidural   Post-op Pain Management:    Induction:   Airway Management Planned:   Additional Equipment:   Intra-op Plan:   Post-operative Plan:   Informed Consent: I have reviewed the patients History and Physical, chart, labs and discussed the procedure including the risks, benefits and alternatives for the proposed anesthesia with the patient or authorized representative who has indicated his/her understanding and acceptance.     Plan Discussed with: Anesthesiologist and Surgeon  Anesthesia Plan Comments:         Anesthesia Quick Evaluation

## 2012-06-30 NOTE — Anesthesia Procedure Notes (Signed)
Epidural Patient location during procedure: OB Start time: 06/30/2012 1:12 PM  Staffing Anesthesiologist: Tyteanna Ost A. Performed by: anesthesiologist   Preanesthetic Checklist Completed: patient identified, site marked, surgical consent, pre-op evaluation, timeout performed, IV checked, risks and benefits discussed and monitors and equipment checked  Epidural Patient position: sitting Prep: site prepped and draped and DuraPrep Patient monitoring: continuous pulse ox and blood pressure Approach: midline Injection technique: LOR air  Needle:  Needle type: Tuohy  Needle gauge: 17 G Needle length: 9 cm and 9 Needle insertion depth: 7 cm Catheter type: closed end flexible Catheter size: 19 Gauge Catheter at skin depth: 12 cm Test dose: negative and Other  Assessment Events: blood not aspirated, injection not painful, no injection resistance, negative IV test and no paresthesia  Additional Notes Patient identified. Risks and benefits discussed including failed block, incomplete  Pain control, post dural puncture headache, nerve damage, paralysis, blood pressure Changes, nausea, vomiting, reactions to medications-both toxic and allergic and post Partum back pain. All questions were answered. Patient expressed understanding and wished to proceed. Sterile technique was used throughout procedure. Epidural site was Dressed with sterile barrier dressing. No paresthesias, signs of intravascular injection Or signs of intrathecal spread were encountered.  Patient was more comfortable after the epidural was dosed. Please see RN's note for documentation of vital signs and FHR which are stable.

## 2012-07-01 ENCOUNTER — Encounter (HOSPITAL_COMMUNITY): Payer: Self-pay

## 2012-07-01 MED ORDER — WITCH HAZEL-GLYCERIN EX PADS: 1.0000 "application " | MEDICATED_PAD | CUTANEOUS | Status: DC | PRN

## 2012-07-01 MED ORDER — PNEUMOCOCCAL VAC POLYVALENT 25 MCG/0.5ML IJ INJ
0.5000 mL | INJECTION | INTRAMUSCULAR | Status: AC
  Administered 2012-07-02: 0.5 mL via INTRAMUSCULAR
  Filled 2012-07-01: qty 0.5

## 2012-07-01 MED ORDER — FAMOTIDINE 20 MG PO TABS
20.0000 mg | ORAL_TABLET | Freq: Two times a day (BID) | ORAL | Status: DC
  Administered 2012-07-01 (×2): 20 mg via ORAL
  Filled 2012-07-01 (×3): qty 1

## 2012-07-01 MED ORDER — DIBUCAINE 1 % RE OINT: 1.0000 "application " | TOPICAL_OINTMENT | RECTAL | Status: DC | PRN

## 2012-07-01 MED ORDER — PRENATAL MULTIVITAMIN CH
1.0000 | ORAL_TABLET | Freq: Every day | ORAL | Status: DC
  Administered 2012-07-01: 1 via ORAL
  Filled 2012-07-01 (×2): qty 1

## 2012-07-01 MED ORDER — TETANUS-DIPHTH-ACELL PERTUSSIS 5-2.5-18.5 LF-MCG/0.5 IM SUSP
0.5000 mL | Freq: Once | INTRAMUSCULAR | Status: AC
  Administered 2012-07-02: 0.5 mL via INTRAMUSCULAR
  Filled 2012-07-01: qty 0.5

## 2012-07-01 MED ORDER — CYCLOBENZAPRINE HCL 10 MG PO TABS
10.0000 mg | ORAL_TABLET | Freq: Three times a day (TID) | ORAL | Status: DC | PRN
  Filled 2012-07-01: qty 1

## 2012-07-01 MED ORDER — IBUPROFEN 600 MG PO TABS
600.0000 mg | ORAL_TABLET | Freq: Four times a day (QID) | ORAL | Status: DC
  Administered 2012-07-01 – 2012-07-02 (×4): 600 mg via ORAL
  Filled 2012-07-01 (×5): qty 1

## 2012-07-01 MED ORDER — BENZOCAINE-MENTHOL 20-0.5 % EX AERO: 1.0000 "application " | INHALATION_SPRAY | CUTANEOUS | Status: DC | PRN

## 2012-07-01 MED ORDER — LANOLIN HYDROUS EX OINT: TOPICAL_OINTMENT | CUTANEOUS | Status: DC | PRN

## 2012-07-01 MED ORDER — ONDANSETRON HCL 4 MG/2ML IJ SOLN: 4.0000 mg | INTRAMUSCULAR | Status: DC | PRN

## 2012-07-01 MED ORDER — SIMETHICONE 80 MG PO CHEW: 80.0000 mg | CHEWABLE_TABLET | ORAL | Status: DC | PRN

## 2012-07-01 MED ORDER — OXCARBAZEPINE 150 MG PO TABS
150.0000 mg | ORAL_TABLET | Freq: Two times a day (BID) | ORAL | Status: DC
  Administered 2012-07-01 (×2): 150 mg via ORAL
  Filled 2012-07-01 (×3): qty 1

## 2012-07-01 MED ORDER — TRIMIPRAMINE MALEATE 50 MG PO CAPS: 50.0000 mg | ORAL_CAPSULE | Freq: Every day | ORAL | Status: DC

## 2012-07-01 MED ORDER — CALCIUM CARBONATE ANTACID 500 MG PO CHEW
2.0000 | CHEWABLE_TABLET | Freq: Two times a day (BID) | ORAL | Status: DC | PRN
  Administered 2012-07-01 – 2012-07-02 (×2): 400 mg via ORAL
  Filled 2012-07-01: qty 1
  Filled 2012-07-01: qty 2

## 2012-07-01 MED ORDER — LAMOTRIGINE 25 MG PO TABS
25.0000 mg | ORAL_TABLET | Freq: Every day | ORAL | Status: DC
  Administered 2012-07-01: 25 mg via ORAL
  Filled 2012-07-01: qty 1

## 2012-07-01 MED ORDER — SENNOSIDES-DOCUSATE SODIUM 8.6-50 MG PO TABS
2.0000 | ORAL_TABLET | Freq: Every day | ORAL | Status: DC
  Administered 2012-07-01: 2 via ORAL

## 2012-07-01 MED ORDER — DIPHENHYDRAMINE HCL 25 MG PO CAPS: 25.0000 mg | ORAL_CAPSULE | Freq: Four times a day (QID) | ORAL | Status: DC | PRN

## 2012-07-01 MED ORDER — HYDROXYZINE HCL 50 MG PO TABS
50.0000 mg | ORAL_TABLET | ORAL | Status: DC
  Administered 2012-07-01 – 2012-07-02 (×5): 50 mg via ORAL
  Filled 2012-07-01 (×6): qty 1

## 2012-07-01 MED ORDER — HYDROXYZINE HCL 50 MG PO TABS
50.0000 mg | ORAL_TABLET | Freq: Four times a day (QID) | ORAL | Status: DC
  Filled 2012-07-01 (×4): qty 1

## 2012-07-01 MED ORDER — ZOLPIDEM TARTRATE 5 MG PO TABS: 5.0000 mg | ORAL_TABLET | Freq: Every evening | ORAL | Status: DC | PRN

## 2012-07-01 MED ORDER — ONDANSETRON HCL 4 MG PO TABS: 4.0000 mg | ORAL_TABLET | ORAL | Status: DC | PRN

## 2012-07-01 MED ORDER — RISPERIDONE 1 MG PO TABS
1.0000 mg | ORAL_TABLET | Freq: Every day | ORAL | Status: DC
  Administered 2012-07-01: 1 mg via ORAL
  Filled 2012-07-01: qty 1

## 2012-07-01 MED ORDER — OXYCODONE-ACETAMINOPHEN 5-325 MG PO TABS
1.0000 | ORAL_TABLET | ORAL | Status: DC | PRN
  Administered 2012-07-01 – 2012-07-02 (×4): 1 via ORAL
  Filled 2012-07-01 (×4): qty 1

## 2012-07-01 NOTE — Progress Notes (Signed)
Post Partum Day 1  Subjective: up ad lib, voiding, tolerating PO, + flatus and complains that she is very sore.  Mom is anxious about caring for baby. Bottle feeding. Planning Depo for contraception.  Objective: Blood pressure 133/60, pulse 106, temperature 98 F (36.7 C), temperature source Oral, resp. rate 18, height 5' (1.524 m), weight 86.183 kg (190 lb), last menstrual period 09/05/2011, SpO2 99.00%, unknown if currently breastfeeding.  Physical Exam:  General: alert, cooperative and no distress Lochia: appropriate Uterine Fundus: firm Incision: n/a DVT Evaluation: No evidence of DVT seen on physical exam. Negative Homan's sign.   Basename 06/30/12 0815  HGB 11.2*  HCT 34.1*    Assessment/Plan: Plan for discharge tomorrow and Social Work consult   LOS: 1 day   Napoleon Form 07/01/2012, 7:17 AM

## 2012-07-01 NOTE — Anesthesia Postprocedure Evaluation (Signed)
    Anesthesia Post-op Note  Patient: Connie [name]  Procedure(s) Performed: * No procedures listed *  Patient Location:  Mother/Baby  Anesthesia Type:Epidural  Level of Consciousness: awake, alert  and oriented  Airway and Oxygen Therapy: Patient Spontanous Breathing  Post-op Pain: mild  Post-op Assessment: Patient's Cardiovascular Status Stable, Respiratory Function Stable, No signs of Nausea or vomiting, Adequate PO intake, Pain level controlled, No headache, No backache, No residual numbness and No residual motor weakness  Post-op Vital Signs: stable  Complications: No apparent anesthesia complications

## 2012-07-01 NOTE — Progress Notes (Signed)
CSW assessment completed.  CSW has concerns and has made a Child Protective Services report.  A worker will meet with [name] at the hospital on 07/02/12.  Please do not discharge baby without CSW approval.  Full assessment to follow.
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
## 2012-07-01 NOTE — L&D Delivery Note (Signed)
Attestation of Attending Supervision of Advanced Practitioner (CNM/NP): Evaluation and management procedures were performed by the Advanced Practitioner under my supervision and collaboration. I have reviewed the Advanced Practitioner's note and chart, and I agree with the management and plan.  LEGGETT,KELLY H. 12:02 PM

## 2012-07-02 DIAGNOSIS — F319 Bipolar disorder, unspecified: Secondary | ICD-10-CM | POA: Diagnosis present

## 2012-07-02 DIAGNOSIS — F411 Generalized anxiety disorder: Secondary | ICD-10-CM

## 2012-07-02 DIAGNOSIS — O99344 Other mental disorders complicating childbirth: Secondary | ICD-10-CM

## 2012-07-02 DIAGNOSIS — O480 Post-term pregnancy: Secondary | ICD-10-CM

## 2012-07-02 DIAGNOSIS — F1910 Other psychoactive substance abuse, uncomplicated: Secondary | ICD-10-CM | POA: Diagnosis present

## 2012-07-02 DIAGNOSIS — IMO0001 Reserved for inherently not codable concepts without codable children: Secondary | ICD-10-CM

## 2012-07-02 MED ORDER — RISPERIDONE 1 MG PO TABS: 1.0000 mg | ORAL_TABLET | Freq: Every day | ORAL | Status: DC

## 2012-07-02 MED ORDER — LAMOTRIGINE 25 MG PO TABS: 25.0000 mg | ORAL_TABLET | Freq: Every day | ORAL | Status: DC

## 2012-07-02 MED ORDER — IBUPROFEN 600 MG PO TABS: 600.0000 mg | ORAL_TABLET | Freq: Four times a day (QID) | ORAL | Status: DC

## 2012-07-02 MED ORDER — HYDROCHLOROTHIAZIDE 25 MG PO TABS: 25.0000 mg | ORAL_TABLET | Freq: Every day | ORAL | Status: DC

## 2012-07-02 MED ORDER — HYDROCHLOROTHIAZIDE 25 MG PO TABS
25.0000 mg | ORAL_TABLET | Freq: Once | ORAL | Status: AC
  Administered 2012-07-02: 25 mg via ORAL
  Filled 2012-07-02: qty 1

## 2012-07-02 MED ORDER — OXCARBAZEPINE 150 MG PO TABS: 150.0000 mg | ORAL_TABLET | Freq: Two times a day (BID) | ORAL | Status: DC
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
## 2012-07-02 NOTE — Plan of Care (Signed)
Patient was discharged without her infant. The infant became a nursery baby. MD and social services aware of patient's condition and discharge plans. DC teaching completed with mother. Mother refused her am medications. I told her what medications to pick up from her pharmacy and to continue taking her current medications. Discharge instructions given and DC papers given and signed.

## 2012-07-02 NOTE — Progress Notes (Signed)
Child Protective Services worker/Stephanie met with [name] to assess the situation.  It has been determined that baby cannot discharge with [name] at this time.  [name] has been instructed to provide names of people who could provide a safe environment for her and baby.  Baby will become a CN patient at this time and [name] can visit as she wants.  She has been told that she cannot stay overnight.  CSW did not see ambulatory rx for psychiatric medications restarted yesterday and called the MD to discuss.  MD states [name] will be given rx for enough medication until her follow up appointment at [facility] and has been instructed by CSW and CPS worker to call [facility] today to make an appointment.  CPS will ensure that she attends this appointment.  CSW will maintain contact with CPS worker regarding baby's disposition. 

## 2012-07-02 NOTE — Discharge Summary (Signed)
Obstetric Discharge Summary Reason for Admission: induction of labor for Postdates Prenatal Procedures: NST Intrapartum Procedures: spontaneous vaginal delivery Postpartum Procedures: none Complications-Operative and Postpartum: none except some labile hypertension Hemoglobin  Date Value Range Status  06/30/2012 11.2* 12.0 - 15.0 g/dL Final     HCT  Date Value Range Status  06/30/2012 34.1* 36.0 - 46.0 % Final  Hospital Course: Connie [name] is a 22 y.o. G1P0000 at 106w0d who presented for induction of labor for post-dates and elevated blood pressures. She was induced with pitocin with normal labor progression.  Delivery Note  At 11:44 PM a viable female was delivered via (Presentation: vertex;OA). APGAR: 8, 9; weight 6lb 13.5 oz.  Placenta status: spontaneous, intact. Cord: nuchal x 1, delivered through, 3-vessel; with the following complications: none. Cord pH: n/a.  Anesthesia: Epidural  Episiotomy:  Lacerations: none  Suture Repair: n/a  Est. Blood Loss (mL): 300  Patient had mental health and substance abuse issues before delivery. Since delivery she has had labile mental health status and Social Work has gotten CPS involved. She has also had some issues with hypertension, which comes and goes. RN believes some of hypertension is related to emotions being out of control during the night.   Physical Exam:  General: alert, cooperative, no distress and moderately obese Lochia: appropriate Uterine Fundus: firm Incision: healing well DVT Evaluation: No evidence of DVT seen on physical exam.  Discharge Diagnoses: Term Pregnancy-delivered and labile hypertension postpartum  Discharge Information: Date: 07/02/2012 Activity: unrestricted and pelvic rest Diet: routine Medications: PNV, Ibuprofen and multiple mental health meds... Will add HCTZ for 5 days Condition: stable Instructions: refer to practice specific booklet Discharge to: home Follow-up Information    Follow up  with Tilda Burrow, MD. Schedule an appointment as soon as possible for a visit in 2 weeks.   Contact information:   [address] [place] [address] [phone]          Newborn Data: Live born female  Birth Weight: 6 lb 13.5 oz (3104 g) APGAR: 8, 9  Home with Plan per Social work and CPS.  [facility] 07/02/2012, 6:03 AM

## 2012-07-09 ENCOUNTER — Emergency Department (HOSPITAL_COMMUNITY)
Admission: EM | Admit: 2012-07-09 | Discharge: 2012-07-11 | Disposition: A | Discharge status: Other Acute Inpatient Hospital | Payer: Medicare Other | Source: Home / Self Care | Attending: Emergency Medicine | Admitting: Emergency Medicine

## 2012-07-09 ENCOUNTER — Encounter (HOSPITAL_COMMUNITY): Payer: Self-pay

## 2012-07-09 DIAGNOSIS — F319 Bipolar disorder, unspecified: Secondary | ICD-10-CM | POA: Insufficient documentation

## 2012-07-09 DIAGNOSIS — F3289 Other specified depressive episodes: Secondary | ICD-10-CM | POA: Insufficient documentation

## 2012-07-09 DIAGNOSIS — Z87440 Personal history of urinary (tract) infections: Secondary | ICD-10-CM | POA: Insufficient documentation

## 2012-07-09 DIAGNOSIS — F29 Unspecified psychosis not due to a substance or known physiological condition: Secondary | ICD-10-CM | POA: Insufficient documentation

## 2012-07-09 DIAGNOSIS — R443 Hallucinations, unspecified: Secondary | ICD-10-CM | POA: Insufficient documentation

## 2012-07-09 DIAGNOSIS — Z87891 Personal history of nicotine dependence: Secondary | ICD-10-CM | POA: Insufficient documentation

## 2012-07-09 DIAGNOSIS — Z87442 Personal history of urinary calculi: Secondary | ICD-10-CM | POA: Insufficient documentation

## 2012-07-09 DIAGNOSIS — E876 Hypokalemia: Secondary | ICD-10-CM | POA: Insufficient documentation

## 2012-07-09 DIAGNOSIS — Z79899 Other long term (current) drug therapy: Secondary | ICD-10-CM | POA: Insufficient documentation

## 2012-07-09 DIAGNOSIS — K219 Gastro-esophageal reflux disease without esophagitis: Secondary | ICD-10-CM | POA: Insufficient documentation

## 2012-07-09 DIAGNOSIS — R51 Headache: Secondary | ICD-10-CM | POA: Insufficient documentation

## 2012-07-09 DIAGNOSIS — F329 Major depressive disorder, single episode, unspecified: Secondary | ICD-10-CM | POA: Insufficient documentation

## 2012-07-09 DIAGNOSIS — F410 Panic disorder [episodic paroxysmal anxiety] without agoraphobia: Secondary | ICD-10-CM | POA: Insufficient documentation

## 2012-07-09 DIAGNOSIS — F411 Generalized anxiety disorder: Secondary | ICD-10-CM | POA: Insufficient documentation

## 2012-07-09 LAB — CBC
HCT: 38.1 % (ref 36.0–46.0)
Hemoglobin: 12.5 g/dL (ref 12.0–15.0)
MCH: 29.2 pg (ref 26.0–34.0)
MCHC: 32.8 g/dL (ref 30.0–36.0)
MCV: 89 fL (ref 78.0–100.0)
Platelets: 542 10*3/uL — ABNORMAL HIGH (ref 150–400)
RBC: 4.28 MIL/uL (ref 3.87–5.11)
RDW: 14.9 % (ref 11.5–15.5)
WBC: 10.9 10*3/uL — ABNORMAL HIGH (ref 4.0–10.5)

## 2012-07-09 LAB — BASIC METABOLIC PANEL
BUN: 9 mg/dL (ref 6–23)
CO2: 24 mEq/L (ref 19–32)
Calcium: 9.8 mg/dL (ref 8.4–10.5)
Chloride: 100 mEq/L (ref 96–112)
Creatinine, Ser: 0.68 mg/dL (ref 0.50–1.10)
GFR calc Af Amer: >90 mL/min (ref >90)
GFR calc non Af Amer: >90 mL/min (ref >90)
Glucose, Bld: 88 mg/dL (ref 70–99)
Potassium: 3.1 mEq/L — ABNORMAL LOW (ref 3.5–5.1)
Sodium: 138 mEq/L (ref 135–145)

## 2012-07-09 LAB — ETHANOL: Alcohol, Ethyl (B): <11 mg/dL (ref 0–11)

## 2012-07-09 LAB — RAPID URINE DRUG SCREEN, HOSP PERFORMED
Amphetamines: NOT DETECTED
Barbiturates: NOT DETECTED
Benzodiazepines: POSITIVE — AB
Cocaine: NOT DETECTED
Opiates: NOT DETECTED
Tetrahydrocannabinol: NOT DETECTED

## 2012-07-09 MED ORDER — ONDANSETRON HCL 4 MG PO TABS
4.0000 mg | ORAL_TABLET | Freq: Three times a day (TID) | ORAL | Status: DC | PRN
  Administered 2012-07-09: 4 mg via ORAL
  Filled 2012-07-09: qty 1

## 2012-07-09 MED ORDER — LORAZEPAM 1 MG PO TABS: 1.0000 mg | ORAL_TABLET | Freq: Three times a day (TID) | ORAL | Status: DC | PRN

## 2012-07-09 MED ORDER — POTASSIUM CHLORIDE CRYS ER 20 MEQ PO TBCR
60.0000 meq | EXTENDED_RELEASE_TABLET | Freq: Once | ORAL | Status: AC
  Administered 2012-07-09: 60 meq via ORAL
  Filled 2012-07-09: qty 3

## 2012-07-09 MED ORDER — ZIPRASIDONE MESYLATE 20 MG IM SOLR
20.0000 mg | Freq: Two times a day (BID) | INTRAMUSCULAR | Status: DC | PRN
  Administered 2012-07-09 – 2012-07-10 (×2): 20 mg via INTRAMUSCULAR
  Filled 2012-07-09 (×2): qty 20

## 2012-07-09 MED ORDER — ZIPRASIDONE HCL 20 MG PO CAPS
20.0000 mg | ORAL_CAPSULE | Freq: Two times a day (BID) | ORAL | Status: DC | PRN
  Filled 2012-07-09 (×5): qty 1

## 2012-07-09 MED ORDER — LORAZEPAM 2 MG/ML IJ SOLN
1.0000 mg | Freq: Once | INTRAMUSCULAR | Status: AC
  Administered 2012-07-09: 1 mg via INTRAVENOUS
  Filled 2012-07-09: qty 1

## 2012-07-09 MED ORDER — ACETAMINOPHEN 325 MG PO TABS: 650.0000 mg | ORAL_TABLET | ORAL | Status: DC | PRN

## 2012-07-09 MED ORDER — ZIPRASIDONE MESYLATE 20 MG IM SOLR
20.0000 mg | Freq: Once | INTRAMUSCULAR | Status: AC
  Administered 2012-07-09: 20 mg via INTRAMUSCULAR
  Filled 2012-07-09: qty 20

## 2012-07-09 MED ORDER — LORAZEPAM 2 MG/ML IJ SOLN
2.0000 mg | Freq: Four times a day (QID) | INTRAMUSCULAR | Status: DC | PRN
  Administered 2012-07-10: 2 mg via INTRAMUSCULAR
  Filled 2012-07-09: qty 1

## 2012-07-09 MED ORDER — LORAZEPAM 1 MG PO TABS
2.0000 mg | ORAL_TABLET | Freq: Four times a day (QID) | ORAL | Status: DC | PRN
  Administered 2012-07-09 – 2012-07-10 (×4): 2 mg via ORAL
  Filled 2012-07-09 (×4): qty 2

## 2012-07-09 NOTE — ED Provider Notes (Signed)
History     CSN: [id number]  Arrival date & time 07/09/12  0150   First MD Initiated Contact with Patient 07/09/12 0221      Chief Complaint  Patient presents with  . V70.1    (Consider location/radiation/quality/duration/timing/severity/associated sxs/prior treatment) HPI  Level 5 caveat due to psychiatric illness Connie [name] is a 22 y.o. female with a h/o bipolar disorder, depression, anxiety  brought in by ambulance, who presents to the Emergency Department complaining of" hearing voices and feeling "like I'm going crazy".Patient delivered a baby boy on 07/01/2012 at [facility] and began to exhibit bizaare behavior. Social services was consulted as was Geophysicist/field seismologist. Arrangements were made for baby to be cared for by a cousin and not the mother. CSW and CPS were to arrange for a follow up appointment with [facility] and Dr. Demaris Callander for initiation of psychiatric medications. It is not clear from the patient's rambling statements whether she was able to get into [facility] since discharge from the hospital.  Prior to her pregnancy she was on the following medications:      DIADIAZEPAM 5 MG PO TABS  Oral  Take 5 mg by mouth daily.    Marland Kitchen  LAMOTRIGINE 25 MG PO TABS  Oral  Take 50 mg by mouth at bedtime.    Connie [name] PO TABS  Oral  Take 1 tablet by mouth daily.    Marland Kitchen  OMEPRAZOLE 20 MG PO CPDR  Oral  Take 20 mg by mouth daily.    Marland Kitchen  OXCARBAZEPINE 300 MG PO TABS  Oral  Take 300 mg by mouth 2 (two) times daily.    Marland Kitchen  RISPERIDONE 1 MG PO TABS  Oral  Take 1 mg by mouth at bedtime.    Marland Kitchen  TRIMIPRAMINE MALEATE 50 MG PO CAPS  Oral  Take 150 mg by mouth at bedtime..     Past Medical History  Diagnosis Date  . GERD (gastroesophageal reflux disease)   . Bipolar 1 disorder   . Depression   . Panic attack   . Headache   . Urinary tract infection   . Kidney stones   . Carpal tunnel syndrome on both sides     with preg  . Bipolar disorder     Past Surgical History    Procedure Date  . Tonsillectomy   . Myringotomy     Family History  Problem Relation Age of Onset  . Other Neg Hx   . Hypertension Mother   . Hypertension Father   . Hypertension Maternal Aunt   . Hypertension Maternal Uncle   . Hypertension Paternal Aunt   . Hypertension Paternal Uncle   . Diabetes Maternal Grandfather     History  Substance Use Topics  . Smoking status: Former Smoker -- 0.5 packs/day    Types: Cigarettes    Quit date: 04/19/2012  . Smokeless tobacco: Never Used  . Alcohol Use: No    OB History    Grav Para Term Preterm Abortions TAB SAB Ect Mult Living   1 1 1  0 0 0 0 0 0 1      Review of Systems  Unable to perform ROS: Psychiatric disorder    Allergies  Review of patient's allergies indicates no known allergies.  Home Medications   Current Outpatient Rx  Name  Route  Sig  Dispense  Refill  . CALCIUM CARBONATE ANTACID 500 MG PO CHEW   Oral   Chew 2 tablets by  mouth 2 (two) times daily as needed. For heartburn         . CYCLOBENZAPRINE HCL 10 MG PO TABS   Oral   Take 1 tablet (10 mg total) by mouth 3 (three) times daily as needed for muscle spasms.   15 tablet   0   . FAMOTIDINE 20 MG PO TABS   Oral   Take 1 tablet (20 mg total) by mouth 2 (two) times daily.   60 tablet   0   . HYDROCHLOROTHIAZIDE 25 MG PO TABS   Oral   Take 1 tablet (25 mg total) by mouth daily.   5 tablet   0   . IBUPROFEN 600 MG PO TABS   Oral   Take 1 tablet (600 mg total) by mouth every 6 (six) hours.   30 tablet   1   . LAMOTRIGINE 25 MG PO TABS   Oral   Take 1 tablet (25 mg total) by mouth at bedtime.   20 tablet   0   . OXCARBAZEPINE 150 MG PO TABS   Oral   Take 1 tablet (150 mg total) by mouth 2 (two) times daily.   40 tablet   0   . PROMETHAZINE HCL 25 MG PO TABS   Oral   Take 0.5-1 tablets (12.5-25 mg total) by mouth every 6 (six) hours as needed for nausea.   30 tablet   0   . PSYLLIUM 28 % PO PACK   Oral   Take 1 packet by  mouth 2 (two) times daily.   30 packet   0   . RISPERIDONE 1 MG PO TABS   Oral   Take 1 tablet (1 mg total) by mouth at bedtime.   20 tablet   0     BP 152/95  Pulse 103  Temp 98 F (36.7 C) (Oral)  Resp 18  SpO2 97%  LMP 09/05/2011  Breastfeeding? Unknown  Physical Exam  Nursing note and vitals reviewed. Constitutional: She appears well-developed and well-nourished.       Awake, alert, she is psychotic, not answering questions regularly. Appears to be listening to outside stimulus.  HENT:  Head: Atraumatic.  Eyes: Right eye exhibits no discharge. Left eye exhibits no discharge.  Neck: Neck supple.  Cardiovascular: Normal heart sounds.   Pulmonary/Chest: Effort normal and breath sounds normal. She exhibits no tenderness.  Abdominal: Soft. There is no tenderness. There is no rebound.  Musculoskeletal: She exhibits no tenderness.       Baseline ROM, no obvious new focal weakness.  Neurological:       Mental status and motor strength appears baseline for patient and situation.  Skin: No rash noted.  Psychiatric:       Anxious, listening to what appears to be outside source. Not attentive to present conversation.    ED Course  Procedures (including critical care time)  Results for orders placed during the hospital encounter of 07/09/12  CBC      Component Value Range   WBC 10.9 (*) 4.0 - 10.5 K/uL   RBC 4.28  3.87 - 5.11 MIL/uL   Hemoglobin 12.5  12.0 - 15.0 g/dL   HCT 40.9  81.1 - 91.4 %   MCV 89.0  78.0 - 100.0 fL   MCH 29.2  26.0 - 34.0 pg   MCHC 32.8  30.0 - 36.0 g/dL   RDW 78.2  95.6 - 21.3 %   Platelets 542 (*) 150 - 400 K/uL  BASIC  METABOLIC PANEL      Component Value Range   Sodium 138  135 - 145 mEq/L   Potassium 3.1 (*) 3.5 - 5.1 mEq/L   Chloride 100  96 - 112 mEq/L   CO2 24  19 - 32 mEq/L   Glucose, Bld 88  70 - 99 mg/dL   BUN 9  6 - 23 mg/dL   Creatinine, Ser 7.82  0.50 - 1.10 mg/dL   Calcium 9.8  8.4 - 95.6 mg/dL   GFR calc non Af Amer >90   >90 mL/min   GFR calc Af Amer >90  >90 mL/min  URINE RAPID DRUG SCREEN (HOSP PERFORMED)      Component Value Range   Opiates NONE DETECTED  NONE DETECTED   Cocaine NONE DETECTED  NONE DETECTED   Benzodiazepines POSITIVE (*) NONE DETECTED   Amphetamines NONE DETECTED  NONE DETECTED   Tetrahydrocannabinol NONE DETECTED  NONE DETECTED   Barbiturates NONE DETECTED  NONE DETECTED  ETHANOL      Component Value Range   Alcohol, Ethyl (B) <11  0 - 11 mg/dL   2130 Spoke with Dr. Jacky Kindle, tele psychiatrist, who will interview patient. [id number] Received tele psych evaluation and recommendations by Dr. Rosette Reveal. He recommended Geodon 20 mg IM, nw and 20 mg Q 12 hours PO/IM for agitation, Ativan 2 mg Q 8 hours PO/IM prn agitation. Initiated recommendations. He also recommended psychiatric admission.   MDM  Patient presents with psychosis, hearing voices and feeling like she is going crazy. Labs and UDS with slightly low potassium and positive for benzodiazepines which we administered. Potassium was given. Patient was interviewed/evaluated by tele psych who recommended admission. ACT to see  Later today and help with placement. Patient is medically cleared for admission. IVC and holding orders are on the chart.   MDM Reviewed: nursing note, vitals and previous chart           Nicoletta Dress. Colon Branch, MD 07/09/12 [phone]

## 2012-07-09 NOTE — ED Notes (Signed)
Agitated, crying, mild thrashing on the stretcher - states she can't rest with all this noise ( several new patients arrived to rooms in vacinity)  Medicated with Geodon (injection) rather than po per patient request

## 2012-07-09 NOTE — ED Notes (Signed)
Dr strand aware of pt status

## 2012-07-09 NOTE — ED Notes (Signed)
Pt called 911 secondary to hearing voices

## 2012-07-09 NOTE — ED Notes (Addendum)
More coherent than upon arrival. States that her cousin has the baby. Knows that she delivered @ [facility], but fuzzy about [facility] admission. Possible selective memory? States "I'm tired of people saying I'm faking my illness" "I need to be committed to a psychiatric" "I'm tired of the voices" but won't tell me what the voices are saying.

## 2012-07-09 NOTE — ED Notes (Signed)
telepsych consult in progress

## 2012-07-09 NOTE — ED Notes (Signed)
Seizure pads placed on stretcher for pt safety
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
## 2012-07-10 LAB — POTASSIUM: Potassium: 3.4 mEq/L — ABNORMAL LOW (ref 3.5–5.1)

## 2012-07-10 MED ORDER — GI COCKTAIL ~~LOC~~
ORAL | Status: AC
  Filled 2012-07-10: qty 30

## 2012-07-10 MED ORDER — GI COCKTAIL ~~LOC~~
30.0000 mL | Freq: Once | ORAL | Status: AC
  Administered 2012-07-10: 30 mL via ORAL

## 2012-07-10 NOTE — Evaluation (Addendum)
Connie [name] 22 yr old female at [facility] ED   Patient chart reviewed.  Patient will be accepted to [facility] pending K+ 3.4 - 3.5.  Would also like to know if UTI has been addressed from 06/30/2013 if not to have treatment started.  Shenna Brissette FNP-BC 07/10/2012 05:47 PM

## 2012-07-10 NOTE — Progress Notes (Signed)
[facility], still no beds, spoke with Janice Coffin.  No beds at [facility], [facility] do not accept [place].  [facility] did not return call.

## 2012-07-10 NOTE — ED Notes (Addendum)
Patient tearful, jittery at present.  States "I don't think I can stand it.  I just might walk out."  Informed she could not do so at this time. Pt. Rocking back and forth in bed, holding head.

## 2012-07-10 NOTE — ED Notes (Signed)
Pt approved at [facility] accepting 400-bed [phone] for report

## 2012-07-10 NOTE — Progress Notes (Signed)
Pt accepted to [facility] pending medical recommendations and by Assunta Found, PA  And a 400 hall bed per Tom in Assessment.

## 2012-07-10 NOTE — ED Notes (Signed)
Gave pt shower. Pt has new scrubs and toiletries.

## 2012-07-10 NOTE — ED Notes (Signed)
Patient very anxious, rocking back and forth in bed.  States "I'm scared to death."  Cannot verbalize why she is scared, but states "I can't focus, I can't think straight."  Reassured patient that we would help her get through this and she would get treatment.

## 2012-07-10 NOTE — Progress Notes (Signed)
9:28 PM  Pt accepted [facility] by [facility].

## 2012-07-10 NOTE — Progress Notes (Signed)
1610 Patient has been seen by [facility]. She is awaiting placement. She is psychotic and endorsing suicidal ideation. IVC and holding orders are on the chart.

## 2012-07-11 ENCOUNTER — Inpatient Hospital Stay (HOSPITAL_COMMUNITY)
Admission: AD | Admit: 2012-07-11 | Discharge: 2012-07-20 | DRG: 885 | Disposition: A | Discharge status: Home / Self Care | Payer: Medicare Other | Source: Other Acute Inpatient Hospital | Attending: Psychiatry | Admitting: Psychiatry

## 2012-07-11 ENCOUNTER — Encounter (HOSPITAL_COMMUNITY): Payer: Self-pay

## 2012-07-11 DIAGNOSIS — R6250 Unspecified lack of expected normal physiological development in childhood: Secondary | ICD-10-CM | POA: Diagnosis present

## 2012-07-11 DIAGNOSIS — F319 Bipolar disorder, unspecified: Principal | ICD-10-CM

## 2012-07-11 DIAGNOSIS — F329 Major depressive disorder, single episode, unspecified: Secondary | ICD-10-CM | POA: Diagnosis present

## 2012-07-11 DIAGNOSIS — Z79899 Other long term (current) drug therapy: Secondary | ICD-10-CM

## 2012-07-11 DIAGNOSIS — IMO0001 Reserved for inherently not codable concepts without codable children: Secondary | ICD-10-CM

## 2012-07-11 DIAGNOSIS — O09299 Supervision of pregnancy with other poor reproductive or obstetric history, unspecified trimester: Secondary | ICD-10-CM | POA: Diagnosis present

## 2012-07-11 DIAGNOSIS — F3289 Other specified depressive episodes: Secondary | ICD-10-CM | POA: Diagnosis present

## 2012-07-11 DIAGNOSIS — IMO0002 Reserved for concepts with insufficient information to code with codable children: Secondary | ICD-10-CM | POA: Diagnosis present

## 2012-07-11 DIAGNOSIS — Z5900 Homelessness unspecified: Secondary | ICD-10-CM

## 2012-07-11 DIAGNOSIS — F603 Borderline personality disorder: Secondary | ICD-10-CM | POA: Diagnosis present

## 2012-07-11 DIAGNOSIS — F819 Developmental disorder of scholastic skills, unspecified: Secondary | ICD-10-CM

## 2012-07-11 DIAGNOSIS — F1910 Other psychoactive substance abuse, uncomplicated: Secondary | ICD-10-CM

## 2012-07-11 DIAGNOSIS — I10 Essential (primary) hypertension: Secondary | ICD-10-CM | POA: Diagnosis present

## 2012-07-11 MED ORDER — CALCIUM CARBONATE ANTACID 500 MG PO CHEW
2.0000 | CHEWABLE_TABLET | Freq: Two times a day (BID) | ORAL | Status: DC | PRN
  Filled 2012-07-11: qty 2

## 2012-07-11 MED ORDER — RISPERIDONE 1 MG PO TABS
1.0000 mg | ORAL_TABLET | Freq: Every day | ORAL | Status: DC
  Administered 2012-07-11 – 2012-07-12 (×2): 1 mg via ORAL
  Filled 2012-07-11 (×3): qty 1

## 2012-07-11 MED ORDER — CYCLOBENZAPRINE HCL 10 MG PO TABS: 10.0000 mg | ORAL_TABLET | Freq: Three times a day (TID) | ORAL | Status: DC | PRN

## 2012-07-11 MED ORDER — ALUM & MAG HYDROXIDE-SIMETH 200-200-20 MG/5ML PO SUSP
30.0000 mL | ORAL | Status: DC | PRN
  Administered 2012-07-11: 30 mL via ORAL

## 2012-07-11 MED ORDER — ACETAMINOPHEN 325 MG PO TABS
650.0000 mg | ORAL_TABLET | Freq: Four times a day (QID) | ORAL | Status: DC | PRN
  Administered 2012-07-12 – 2012-07-19 (×3): 650 mg via ORAL

## 2012-07-11 MED ORDER — MAGNESIUM HYDROXIDE 400 MG/5ML PO SUSP: 30.0000 mL | Freq: Every day | ORAL | Status: DC | PRN

## 2012-07-11 MED ORDER — PROMETHAZINE HCL 25 MG PO TABS: 12.5000 mg | ORAL_TABLET | Freq: Four times a day (QID) | ORAL | Status: DC | PRN

## 2012-07-11 MED ORDER — FAMOTIDINE 20 MG PO TABS
20.0000 mg | ORAL_TABLET | Freq: Two times a day (BID) | ORAL | Status: DC
  Administered 2012-07-11 – 2012-07-20 (×18): 20 mg via ORAL
  Filled 2012-07-11: qty 28
  Filled 2012-07-11 (×5): qty 1
  Filled 2012-07-11 (×2): qty 28
  Filled 2012-07-11 (×3): qty 1
  Filled 2012-07-11: qty 28
  Filled 2012-07-11 (×2): qty 1
  Filled 2012-07-11 (×2): qty 28
  Filled 2012-07-11: qty 1
  Filled 2012-07-11 (×2): qty 28
  Filled 2012-07-11 (×2): qty 1
  Filled 2012-07-11 (×2): qty 28
  Filled 2012-07-11 (×2): qty 1

## 2012-07-11 MED ORDER — TRAZODONE HCL 50 MG PO TABS
50.0000 mg | ORAL_TABLET | Freq: Every evening | ORAL | Status: DC | PRN
  Administered 2012-07-12: 50 mg via ORAL
  Filled 2012-07-11: qty 1

## 2012-07-11 MED ORDER — LORAZEPAM 1 MG PO TABS
1.0000 mg | ORAL_TABLET | Freq: Three times a day (TID) | ORAL | Status: DC | PRN
  Administered 2012-07-11 – 2012-07-18 (×8): 1 mg via ORAL
  Filled 2012-07-11 (×8): qty 1

## 2012-07-11 MED ORDER — PSYLLIUM 95 % PO PACK
1.0000 | PACK | Freq: Every day | ORAL | Status: DC
  Filled 2012-07-11 (×12): qty 1

## 2012-07-11 MED ORDER — IBUPROFEN 600 MG PO TABS
600.0000 mg | ORAL_TABLET | Freq: Four times a day (QID) | ORAL | Status: DC
  Administered 2012-07-12 – 2012-07-20 (×18): 600 mg via ORAL
  Filled 2012-07-11 (×44): qty 1

## 2012-07-11 MED ORDER — HYDROCHLOROTHIAZIDE 25 MG PO TABS
25.0000 mg | ORAL_TABLET | Freq: Every day | ORAL | Status: DC
  Administered 2012-07-12 – 2012-07-20 (×9): 25 mg via ORAL
  Filled 2012-07-11: qty 14
  Filled 2012-07-11 (×4): qty 1
  Filled 2012-07-11: qty 14
  Filled 2012-07-11 (×4): qty 1
  Filled 2012-07-11 (×3): qty 14

## 2012-07-11 MED ORDER — RISPERIDONE 1 MG PO TBDP
1.0000 mg | ORAL_TABLET | Freq: Once | ORAL | Status: AC | PRN
  Administered 2012-07-11: 1 mg via ORAL

## 2012-07-11 MED ORDER — OXCARBAZEPINE 150 MG PO TABS
150.0000 mg | ORAL_TABLET | Freq: Two times a day (BID) | ORAL | Status: DC
  Administered 2012-07-12 – 2012-07-14 (×5): 150 mg via ORAL
  Filled 2012-07-11 (×9): qty 1

## 2012-07-11 MED ORDER — LAMOTRIGINE 25 MG PO TABS
25.0000 mg | ORAL_TABLET | Freq: Every day | ORAL | Status: DC
  Administered 2012-07-11 – 2012-07-12 (×2): 25 mg via ORAL
  Filled 2012-07-11 (×3): qty 1

## 2012-07-11 NOTE — Clinical Social Work Psychosocial (Signed)
[facility] Group Notes: (Clinical Social Work)   07/11/2012      Type of Therapy:  Group Therapy   Participation Level:  Did Not Attend    Ambrose Mantle, LCSW 07/11/2012, 12:51 PM
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
## 2012-07-11 NOTE — Progress Notes (Signed)
Psychoeducational Group Note  Date:  07/11/2012 Time:  2000  Group Topic/Focus:  Wrap-Up Group:   The focus of this group is to help patients review their daily goal of treatment and discuss progress on daily workbooks.  Participation Level:  Minimal  Participation Quality:  Sharing  Affect:  Flat  Cognitive:  Oriented  Insight:  Limited  Engagement in Group:  Engaged  Additional Comments:  Patient attended and participated in group tonight. She report that she did not attend group today. She have had no sleep in eight days. She will talk to the doctor tomorrow to request sleeping medication.  Lita Mains [facility] 07/11/2012, 9:33 PM

## 2012-07-11 NOTE — Progress Notes (Signed)
[facility] Group Notes:  (Counselor/Nursing/MHT/Case Management/Adjunct)  07/11/2012 10:18 AM  Type of Therapy:  self inventory  Participation Level:  Active  Participation Quality:  Appropriate  Affect:  Depressed  Cognitive:  Alert  Insight:  Improving  Engagement in Group:  Improving  Engagement in Therapy:  Engaged  Modes of Intervention:  Discussion  Summary of Progress/Problems:   Connie [name] 07/11/2012, 10:18 AM

## 2012-07-11 NOTE — Progress Notes (Signed)
Writer spoke with patient and she c/o sadness and missing her son Connie [name]. Patient reports that she spoke with her cousin where her son is and he is doing fine. Patient reported that she has been off her medications and has been having trouble sleeping. Writer informed patient of her scheduled medications and she is agreeable to taking them. Patient currently denies pain, -si/hi/a/v hallucinations. Patient attended group and reports that she looks forward to talking with the doctor on tomorrow. Patient has minimal interaction with peers and is tearful off and on. Writer informed patient to let me know if trouble sleeping tonight so doctor on call can be notified. Support and encouragement offered, safety maintained on unit, will continue to monitor.

## 2012-07-11 NOTE — Tx Team (Signed)
Initial Interdisciplinary Treatment Plan  PATIENT STRENGTHS: (choose at least two) Average or above average intelligence Communication skills Motivation for treatment/growth  PATIENT STRESSORS: Loss of Baby*   PROBLEM LIST: Problem List/Patient Goals Date to be addressed Date deferred Reason deferred Estimated date of resolution  Post Partum Depression      Suicide      [employer]                                           DISCHARGE CRITERIA:  Improved stabilization in mood, thinking, and/or behavior Reduction of life-threatening or endangering symptoms to within safe limits Verbal commitment to aftercare and medication compliance  PRELIMINARY DISCHARGE PLAN: Attend aftercare/continuing care group Attend PHP/IOP  PATIENT/FAMIILY INVOLVEMENT: This treatment plan has been presented to and reviewed with the patient, Connie [name].  The patient and family have been given the opportunity to ask questions and make suggestions.  Jacques Navy A 07/11/2012, 4:03 AM

## 2012-07-11 NOTE — H&P (Signed)
I agree with major elements of this evaluation. 

## 2012-07-11 NOTE — Progress Notes (Signed)
Patient ID: CAMI DELAWDER, female   DOB: [birth year], 22 y.o.   MRN: [id number]  Admission Note:  D:21 yr Female who presents IVC in no acute distress for the treatment of Post Partum Depression, SI, and Bipolar Disorder . Pt appears flat and depressed. Pt was very guarded when being questioned offering bits and pieces of information. Pt states she will elaborate on full story tomorrow.  Pt was calm and cooperative with admission process. Pt presents with passive AVH and contracts for safety upon admission. Pt denies SI/HI at this time Pt has Past medical Hx of Bipolar Disorder, Pre-eclampsia, kidney disease, GERD, HA, and carpule tunnel. Pt gave birth on 07/01/12 and the baby is with Pt cousin per CPS, to keep child from going into foster care. Pt claims she lost 30 lbs since having the baby on 07/01/12. Pt still complaining of lactation issues.  A:Skin was assessed and found  marks on R-antecubital space, bruise on Left shoulder . POC and unit policies explained and understanding verbalized. Consents obtained. Food and fluids offered, and fluids accepted.15 min safety checks started. Clinical research associate offered support. Pt educated on using cold compresses and supportive bra to decrease swelling and pain  R: Pt had no additional questions or concerns at this time.Pt was receptive to education about the milieu .

## 2012-07-11 NOTE — Progress Notes (Signed)
D   Pt appears fearful and refused medications this morning  She said she would wait until after she saw the doctor   She attended group and opened up about her being afraid of being here and didn't know what to expect   She has been cooperative and interacts appropriately with select peers and staff A   Verbal support given  Medications administered and effectiveness monitored   Q 15 min checks  Reassure pt of safety and provide diversional activities R   Pt safe at present
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
## 2012-07-12 DIAGNOSIS — O09299 Supervision of pregnancy with other poor reproductive or obstetric history, unspecified trimester: Secondary | ICD-10-CM | POA: Diagnosis present

## 2012-07-12 DIAGNOSIS — F819 Developmental disorder of scholastic skills, unspecified: Secondary | ICD-10-CM | POA: Diagnosis present

## 2012-07-12 DIAGNOSIS — Z5900 Homelessness unspecified: Secondary | ICD-10-CM

## 2012-07-12 NOTE — Progress Notes (Signed)
Psychoeducational Group Note  Date:  07/12/2012 Time:  0945 am  Group Topic/Focus:  Making Healthy Choices:   The focus of this group is to help patients identify negative/unhealthy choices they were using prior to admission and identify positive/healthier coping strategies to replace them upon discharge.  Participation Level:  Minimal  Participation Quality:  Inattentive  Affect:  Appropriate  Cognitive:  Alert  Insight:  Poor  Engagement in Group:  Limited  Additional Comments:    [name] 07/12/2012, 10:29 AM
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
## 2012-07-12 NOTE — Progress Notes (Signed)
Psychoeducational Group Note  Date:  07/12/2012 Time:  2000  Group Topic/Focus:  Wrap-Up Group:   The focus of this group is to help patients review their daily goal of treatment and discuss progress on daily workbooks.  Participation Level:  Minimal  Participation Quality:  Appropriate  Affect:  Flat  Cognitive:  Appropriate  Insight:  Limited  Engagement in Group:  Improving  Additional Comments:  Patient attended and participated in group tonight. Patient reports having a better day than yesterday. She went outside, and attended groups. The NP spoke with her.  Connie [name] [facility] 07/12/2012, 11:26 PM

## 2012-07-12 NOTE — Clinical Social Work Psychosocial (Signed)
[facility] Group Notes:  (Clinical Social Work)  07/12/2012   9;30-10AM  Summary of Progress/Problems:  The main focus of today's process group was to listen to a variety of genres of music and to identify that different types of music provoke different responses.  The patient then was able to identify personally what was soothing for them, as well as energizing.  Examples were given of how to use this knowledge in sleep habits, with depression, and with other symptoms.  The patient expressed understanding of the types of music which felt good to her, as well as those that did not feel as good.  Type of Therapy:  Music Therapy with processing done  Participation Level:  Active  Participation Quality:  Appropriate, Attentive and Sharing  Affect:  Blunted  Cognitive:  Appropriate and Oriented  Insight:  Engaged  Engagement in Therapy:  Engaged  Modes of Intervention:   Socialization, Teacher, English as a foreign language, Exploration, Education, Rapport Building   [employer], LCSW 07/12/2012, 10:08 AM

## 2012-07-12 NOTE — Progress Notes (Signed)
D   Pt has been labile and tearful this morning  She gets frustrated easily and has difficulty asking for things in a way that staff can understand her   When asked for clarification she cries and walks off only to come back later apologizing   She claims to have brought a cardboard box of papers with her to the hospital but one cannot be found so far and staff has looked   She requested a pill to calm her down and one to ease her cramps  She has limited participation in groups and interacts with select peers A   Verbal support given  Medications administered and effectiveness monitored  Q 15 min checks R   Pt safe at present

## 2012-07-12 NOTE — Progress Notes (Signed)
Patient attended group this evening and reports that she has had a better day than yesterday. Patient had requested clothes from the clothes closet which writer obtained and gave to patient. Patient reported that her abdomen is still sore and she requested pants with elastic waistband. Patient was asked in group who her support system was and she reported that [facility] and her sister [name] are her support system. Patient has had minimal interaction with peers. Patient currently denies si/hi/a/v hallucinations, will continue to monitor. Safety maintained with 15 min checks in place.

## 2012-07-13 MED ORDER — RISPERIDONE 2 MG PO TABS
2.0000 mg | ORAL_TABLET | Freq: Every day | ORAL | Status: DC
  Administered 2012-07-13: 2 mg via ORAL
  Filled 2012-07-13 (×2): qty 1

## 2012-07-13 NOTE — Progress Notes (Signed)
Patient ID: Connie [name], female   DOB: 07/10/1990, 22 y.o.   MRN: [id number] Patient continues to endorse post partum depression.  She rates her depression as a 5.  She became tearful when speaking of her baby.  Patient claims that her cousin will be taking care of the infant until she gets stabilized on her medication.  Patient is currently living with the sister of an employee here at [facility] and plans to be discharged there.  Discharge is scheduled for tomorrow.  She denies SI/HI/AVH at this time.  She states, "I knew I just needed to be on my meds."  Continue to monitor medication management and MD orders.  Continue 15 safety checks per protocol. Collaborate with treatment team regarding patient's POC.  Patient's behavior has been appropriate; encourage and support patient as needed.

## 2012-07-13 NOTE — Progress Notes (Signed)
Patient ID: Connie [name], female   DOB: 04-21-1991, 22 y.o.   MRN: [id number] PER STATE REGULATIONS 482.30  THIS CHART WAS REVIEWED FOR MEDICAL NECESSITY WITH RESPECT TO THE PATIENT'S ADMISSION/ DURATION OF STAY.  NEXT REVIEW DATE: 07/14/2012  Willa Rough, RN, BSN CASE MANAGER

## 2012-07-13 NOTE — Progress Notes (Signed)
Nutrition Brief Note  Pt meets criteria for severe MALNUTRITION in the context of acute illness as evidenced by <50% estimated energy intake with 15% weight loss in the past month per pt report.  Patient identified on the Malnutrition Screening Tool (MST) Report  Body mass index is 33.93 kg/(m^2). Patient meets criteria for class I obesity based on current BMI.   Pt reports 30 pound unintended weight loss since birth of her first child 07/01/12. Pt reports she has no appetite and has been consuming minimal amounts of food/day PTA. Pt reports eating better since admission, confirmed with RN. Pt reports she is not interested in nutritional supplements. Pt currently eating well without any further nutrition intervention indicated at this time.   Levon Hedger MS, RD, LDN [phone] Pager [phone] After Hours Pager

## 2012-07-13 NOTE — Treatment Plan (Signed)
Interdisciplinary Treatment Plan Update (Adult)  Date: 07/13/2012  Time Reviewed: 11:23 AM   Progress in Treatment: Attending groups: Yes Participating in groups: Yes Taking medication as prescribed: Yes Tolerating medication: Yes   Family/Significant other contact made:  No Patient understands diagnosis:  Yes  As evidenced by asking for help with "bipolar symptoms" Discussing patient identified problems/goals with staff:  Yes  See below Medical problems stabilized or resolved:  Yes Denies suicidal/homicidal ideation: Yes In tx team Issues/concerns per patient self-inventory:  Yes C/O poor sleep, lightheadedness, dizziness, headaches  Rates depression a 5 Other:  New problem(s) identified: N/A  Reason for Continuation of Hospitalization: Depression Mania Medication stabilization  Interventions implemented related to continuation of hospitalization: Risperdal trial  Encourage group attendance and participation  Additional comments:  Estimated length of stay: 3-5 days  Discharge Plan: unknown  New goal(s): N/A  Review of initial/current patient goals per problem list:   1.  Goal(s): Eliminate SI  Met:  Yes  Target date:1/13 As evidenced [id number] report 2.  Goal (s): Stabilize mood with the help of medications/therapeutic milieu  Met:  No  Target date:1/7  As evidenced by: Increased sleep, depression and anxiety rating of 3 or less, no mood lability, no tearfullness when discussing issues  3.  Goal(s): Identify dispositional plan and oupt follow up  Met:  No  Target date:1/17  As evidenced [id number] report  4.  Goal(s):  Met:  No  Target date:  As evidenced by:  Attendees: Patient:     Family:     Physician:  Thedore Mins 07/13/2012 11:23 AM   Nursing: Joslyn Devon   07/13/2012 11:23 AM   Clinical Social Worker:  Richelle Ito 07/13/2012 11:23 AM   Extender:  Verne Spurr PA 07/13/2012 11:23 AM   Other:     Other:     Other:     Other:       Scribe for Treatment Team:   Ida Rogue, 07/13/2012 11:23 AM

## 2012-07-13 NOTE — Progress Notes (Signed)
The focus of this group is to help patients review their daily goal of treatment and discuss progress on daily workbooks. Pt attended the evening group and participated in the discussion about finding positives and expressing needs. Pt reported that she had a good conversation with her doctor today and that she hoped that her medication would help her get well enough to go home soon.

## 2012-07-13 NOTE — Progress Notes (Signed)
[facility] LCSW Aftercare Discharge Planning Group Note  07/13/2012 4:33 PM  Participation Quality:  Reluctant  Affect:  Depressed  Cognitive:  Appropriate  Insight:  Unable to determine  Engagement in Group:  Limited  Modes of Intervention:  Discussion, Exploration and Socialization  Summary of Progress/Problems: Connie [name] was willing to share that she has been staying here and there, and that she and her son's father were staying together in [facility] and that they recently broke up, but that was as much as she was willing to say in group.  She confirmed that her family is not supportive and that she has not been hospitalized in the past.  Was not willing to give any further info in the group setting.  Ida Rogue 07/13/2012, 4:33 PM

## 2012-07-13 NOTE — Clinical Social Work Note (Signed)
  Type of Therapy: Process Group Therapy  Participation Level:  Minimal  Participation Quality:  Resistant  Affect:  Depressed, Tearful  Cognitive:  Alert  Insight:  Limited  Engagement in Group:  Limited  Engagement in Therapy:  Limited  Modes of Intervention:  Activity, Clarification, Education, Problem-solving and Support  Summary of Progress/Problems: Today's group addressed the issue of overcoming obstacles.  Patients were asked to identify their biggest obstacle post d/c that stands in the way of their on-going success, and then problem solve as to how to manage this.  Armentha was asked by another patient how she was doing without her child.  Apparently this patient know Jakaya had just given birth and it was asked in genuine concern.  Poetry confirmed she misses her very much, became tearful and asked to be excused from group.       Ida Rogue 07/13/2012   4:46 PM

## 2012-07-13 NOTE — Progress Notes (Signed)
Patient ID: Connie [name], female   DOB: [birth year], 22 y.o.   MRN: [id number] [facility] MD Progress Note  07/13/2012 11:07 AM Connie [name]  MRN:  [id number]  Subjective: Patient reports that she just delivered a baby about 14 days ago and was diagnosed with post-partum depression. She reports that prior to that she was diagnosed with Bipolar disorder and did not receive her medication during her pregnancy. Currently she reports mood swings, feeling hopeless, crying episodes, worthless and difficulty sleeping. She states that her baby has been placed with her cousin temporarily by DSS who has mandated that she needs to get back on her medications. Patient reports self harming thoughts but has no plan to kill herself.  Diagnosis:  Bipolar I disorder most recent episode depressed  ADL's:  Intact  Sleep: Fair  Appetite:  Fair  Suicidal Ideation:  denies Homicidal Ideation:  denies AEB (as evidenced by):patient's affect, verbal response and written response to daily self enquiry.  Psychiatric Specialty Exam: Review of Systems  Constitutional: Negative.  Negative for fever, chills, weight loss, malaise/fatigue and diaphoresis.  HENT: Negative.  Negative for congestion and sore throat.   Eyes: Negative.  Negative for blurred vision, double vision and photophobia.  Respiratory: Negative.  Negative for cough, shortness of breath and wheezing.   Cardiovascular: Negative.  Negative for palpitations and PND. Chest pain: due to breast engorgement secondary to lactation.  Gastrointestinal: Negative.  Negative for heartburn, nausea, vomiting, abdominal pain, diarrhea and constipation.  Genitourinary: Negative.   Musculoskeletal: Negative.  Negative for myalgias, joint pain and falls.  Skin: Negative.   Neurological: Negative.  Negative for dizziness, tingling, tremors, sensory change, speech change, focal weakness, seizures, loss of consciousness, weakness and headaches.  Endo/Heme/Allergies:  Negative.  Negative for polydipsia. Does not bruise/bleed easily.  Psychiatric/Behavioral: Positive for depression. Negative for suicidal ideas, hallucinations, memory loss and substance abuse. The patient is nervous/anxious and has insomnia.     Blood pressure 128/92, pulse 121, temperature 97.6 F (36.4 C), temperature source Oral, resp. rate 20, height 4\' 11"  (1.499 m), weight 76.204 kg (168 lb), last menstrual period 09/05/2011, not currently breastfeeding.Body mass index is 33.93 kg/(m^2).  General Appearance: Fairly Groomed  Patent attorney::  Good  Speech:  Clear and Coherent  Volume:  Normal  Mood:  Depressed  Affect:  Depressed guarded. Does not want to discuss issues with baby's father that caused her to be in hospital.  Thought Process:  Goal Directed  Orientation:  Full (Time, Place, and Person)  Thought Content:  NA  Suicidal Thoughts:  No  Homicidal Thoughts:  No  Memory:  Immediate;   Good  Judgement:  Impaired  Insight:  Lacking  Psychomotor Activity:  Normal  Concentration:  Fair  Recall:  Fair  Akathisia:  No  Handed:  Right  AIMS (if indicated):     Assets:  Resilience  Sleep:  Number of Hours: 5.5    Current Medications: Current Facility-Administered Medications  Medication Dose Route Frequency Provider Last Rate Last Dose  . acetaminophen (TYLENOL) tablet 650 mg  650 mg Oral Q6H PRN Shuvon Rankin, NP   650 mg at 07/12/12 0403  . alum & mag hydroxide-simeth (MAALOX/MYLANTA) 200-200-20 MG/5ML suspension 30 mL  30 mL Oral Q4H PRN Shuvon Rankin, NP   30 mL at 07/11/12 0253  . calcium carbonate (TUMS - dosed in mg elemental calcium) chewable tablet 400 mg of elemental calcium  2 tablet Oral BID PRN Mickie D. Pernell Dupre, PA      .  famotidine (PEPCID) tablet 20 mg  20 mg Oral BID Mickie D. Adams, PA   20 mg at 07/13/12 0759  . hydrochlorothiazide (HYDRODIURIL) tablet 25 mg  25 mg Oral Daily Mickie D. Adams, PA   25 mg at 07/13/12 0759  . ibuprofen (ADVIL,MOTRIN) tablet 600 mg   600 mg Oral Q6H Mickie D. Adams, PA   600 mg at 07/13/12 1610  . LORazepam (ATIVAN) tablet 1 mg  1 mg Oral Q8H PRN Shuvon Rankin, NP   1 mg at 07/12/12 2317  . magnesium hydroxide (MILK OF MAGNESIA) suspension 30 mL  30 mL Oral Daily PRN Shuvon Rankin, NP      . OXcarbazepine (TRILEPTAL) tablet 150 mg  150 mg Oral BID Mickie D. Adams, PA   150 mg at 07/13/12 0800  . psyllium (HYDROCIL/METAMUCIL) packet 1 packet  1 packet Oral Daily Mickie D. Adams, PA      . risperiDONE (RISPERDAL) tablet 2 mg  2 mg Oral QHS Matricia Begnaud      . traZODone (DESYREL) tablet 50 mg  50 mg Oral QHS PRN Cleotis Nipper, MD   50 mg at 07/12/12 0001    Lab Results:  No results found for this or any previous visit (from the past 48 hour(s)).  Physical Findings: AIMS: Facial and Oral Movements Muscles of Facial Expression: None, normal Lips and Perioral Area: None, normal Jaw: None, normal Tongue: None, normal,Extremity Movements Upper (arms, wrists, hands, fingers): None, normal Lower (legs, knees, ankles, toes): None, normal, Trunk Movements Neck, shoulders, hips: None, normal, Overall Severity Severity of abnormal movements (highest score from questions above): None, normal Incapacitation due to abnormal movements: None, normal Patient's awareness of abnormal movements (rate only patient's report): No Awareness, Dental Status Current problems with teeth and/or dentures?: No Does patient usually wear dentures?: No  CIWA:  CIWA-Ar Total: 2  COWS:     Treatment Plan Summary: Daily contact with patient to assess and evaluate symptoms and progress in treatment Medication management  Plan: 1. Patient will be allowed to wear her breast binder for suppression of lactation. 2. Will continue patient on Trileptal 150mg  twice a day for mood stabilization and Increase Risperidone to 2mg  daily at bedtime. 3. Patient encouraged to attend groups and other unit miliue.  Medical Decision Making Problem Points:   Established problem, stable/improving (1), New problem, with no additional work-up planned (3) and Review of psycho-social stressors (1) Data Points:  Order Aims Assessment (2) Review or order clinical lab tests (1) Review of medication regiment & side effects (2)  I certify that inpatient services furnished can reasonably be expected to improve the patient's condition.   Thedore Mins, MD 07/13/2012, 11:07 AM

## 2012-07-14 MED ORDER — QUETIAPINE FUMARATE ER 200 MG PO TB24
200.0000 mg | ORAL_TABLET | Freq: Every day | ORAL | Status: DC
  Administered 2012-07-14: 200 mg via ORAL
  Filled 2012-07-14 (×2): qty 1

## 2012-07-14 MED ORDER — OXCARBAZEPINE 300 MG PO TABS
300.0000 mg | ORAL_TABLET | Freq: Two times a day (BID) | ORAL | Status: DC
  Administered 2012-07-14 – 2012-07-20 (×12): 300 mg via ORAL
  Filled 2012-07-14 (×2): qty 1
  Filled 2012-07-14: qty 28
  Filled 2012-07-14: qty 1
  Filled 2012-07-14 (×2): qty 28
  Filled 2012-07-14: qty 1
  Filled 2012-07-14: qty 28
  Filled 2012-07-14: qty 1
  Filled 2012-07-14 (×3): qty 28
  Filled 2012-07-14 (×3): qty 1
  Filled 2012-07-14 (×3): qty 28

## 2012-07-14 MED ORDER — DOXEPIN HCL 25 MG PO CAPS
50.0000 mg | ORAL_CAPSULE | Freq: Every evening | ORAL | Status: DC | PRN
  Administered 2012-07-14 – 2012-07-19 (×5): 50 mg via ORAL
  Filled 2012-07-14: qty 2
  Filled 2012-07-14: qty 1
  Filled 2012-07-14: qty 2
  Filled 2012-07-14: qty 1
  Filled 2012-07-14 (×2): qty 2
  Filled 2012-07-14: qty 1
  Filled 2012-07-14: qty 2

## 2012-07-14 NOTE — Progress Notes (Signed)
[facility] LCSW Aftercare Discharge Planning Group Note  07/14/2012 1:20 PM  Participation Quality:  Attentive  Affect:  Depressed  Cognitive:  Appropriate  Insight:  Limited  Engagement in Group:  Engaged  Modes of Intervention:  Discussion, Exploration and Socialization  Summary of Progress/Problems: Today Connie [name] announced that she is ready to go home.  Has confirmed that she can stay with a friend, and gave me number in case I needed to confirm.  Believes she can get more help as an outpt.  I explained that with a new medication on board just started last night, and the fact that she was so tearful yesterday, d/c would likely not happen today.  She asked for assurance that she would see the Dr today.  Daryel Gerald B 07/14/2012, 1:20 PM

## 2012-07-14 NOTE — Progress Notes (Signed)
The focus of this group is to help patients review their daily goal of treatment and discuss progress on daily workbooks. Pt attended the evening group session and participated in the discussion. Pt reported that she had a good day, the high point of which was getting a visit from a friend. Pt also reported that she had made several friends on the hallway, which helped her have a good day as well.

## 2012-07-14 NOTE — Progress Notes (Signed)
Patient ID: SAN RUA, female   DOB: 24-Jul-1990, 24 y.o.   MRN: [id number] [facility] MD Progress Note  07/14/2012 10:17 AM Connie [name]  MRN:  [id number]  Subjective: Patient reports difficulty sleeping, mood swings, feeling hopeless, crying episodes and worthless. Patient also reports that she is missing her new born baby. She states that her baby has been placed with her cousin temporarily by DSS who has mandated that she needs to get back on her medications. Patient denies suicidal or homicidal ideations, intent or plan.  Diagnosis:  Bipolar I disorder most recent episode depressed  ADL's:  Intact  Sleep: Fair  Appetite:  Fair  Suicidal Ideation:  denies Homicidal Ideation:  denies AEB (as evidenced by):patient's affect, verbal response and written response to daily self enquiry.  Psychiatric Specialty Exam: Review of Systems  Constitutional: Negative.  Negative for fever, chills, weight loss, malaise/fatigue and diaphoresis.  HENT: Negative.  Negative for congestion and sore throat.   Eyes: Negative.  Negative for blurred vision, double vision and photophobia.  Respiratory: Negative.  Negative for cough, shortness of breath and wheezing.   Cardiovascular: Negative.  Negative for palpitations and PND. Chest pain: due to breast engorgement secondary to lactation.  Gastrointestinal: Negative.  Negative for heartburn, nausea, vomiting, abdominal pain, diarrhea and constipation.  Genitourinary: Negative.   Musculoskeletal: Negative.  Negative for myalgias, joint pain and falls.  Skin: Negative.   Neurological: Negative.  Negative for dizziness, tingling, tremors, sensory change, speech change, focal weakness, seizures, loss of consciousness, weakness and headaches.  Endo/Heme/Allergies: Negative.  Negative for polydipsia. Does not bruise/bleed easily.  Psychiatric/Behavioral: Positive for depression. Negative for suicidal ideas, hallucinations, memory loss and substance  abuse. The patient is nervous/anxious and has insomnia.     Blood pressure 120/88, pulse 123, temperature 98.2 F (36.8 C), temperature source Oral, resp. rate 20, height 4\' 11"  (1.499 m), weight 76.204 kg (168 lb), last menstrual period 09/05/2011, not currently breastfeeding.Body mass index is 33.93 kg/(m^2).  General Appearance: Fairly Groomed  Patent attorney::  Good  Speech:  Clear and Coherent  Volume:  Normal  Mood:  Depressed  Affect:  Depressed guarded. Does not want to discuss issues with baby's father that caused her to be in hospital.  Thought Process:  Goal Directed  Orientation:  Full (Time, Place, and Person)  Thought Content:  NA  Suicidal Thoughts:  No  Homicidal Thoughts:  No  Memory:  Immediate;   Good  Judgement:  Impaired  Insight:  Lacking  Psychomotor Activity:  Normal  Concentration:  Fair  Recall:  Fair  Akathisia:  No  Handed:  Right  AIMS (if indicated):     Assets:  Resilience  Sleep:  Number of Hours: 5    Current Medications: Current Facility-Administered Medications  Medication Dose Route Frequency Provider Last Rate Last Dose  . acetaminophen (TYLENOL) tablet 650 mg  650 mg Oral Q6H PRN Shuvon Rankin, NP   650 mg at 07/12/12 0403  . alum & mag hydroxide-simeth (MAALOX/MYLANTA) 200-200-20 MG/5ML suspension 30 mL  30 mL Oral Q4H PRN Shuvon Rankin, NP   30 mL at 07/11/12 0253  . calcium carbonate (TUMS - dosed in mg elemental calcium) chewable tablet 400 mg of elemental calcium  2 tablet Oral BID PRN Mickie D. Adams, PA      . doxepin (SINEQUAN) capsule 50 mg  50 mg Oral QHS PRN Lamarco Gudiel      . famotidine (PEPCID) tablet 20 mg  20 mg Oral BID  Mickie D. Adams, PA   20 mg at 07/14/12 0817  . hydrochlorothiazide (HYDRODIURIL) tablet 25 mg  25 mg Oral Daily Mickie D. Adams, PA   25 mg at 07/14/12 0816  . ibuprofen (ADVIL,MOTRIN) tablet 600 mg  600 mg Oral Q6H Mickie D. Adams, PA   600 mg at 07/14/12 1610  . LORazepam (ATIVAN) tablet 1 mg  1 mg Oral Q8H  PRN Shuvon Rankin, NP   1 mg at 07/13/12 1625  . magnesium hydroxide (MILK OF MAGNESIA) suspension 30 mL  30 mL Oral Daily PRN Shuvon Rankin, NP      . Oxcarbazepine (TRILEPTAL) tablet 300 mg  300 mg Oral BID Loring Liskey      . psyllium (HYDROCIL/METAMUCIL) packet 1 packet  1 packet Oral Daily Mickie D. Adams, PA      . QUEtiapine (SEROQUEL XR) 24 hr tablet 200 mg  200 mg Oral QPC supper Catalaya Garr        Lab Results:  No results found for this or any previous visit (from the past 48 hour(s)).  Physical Findings: AIMS: Facial and Oral Movements Muscles of Facial Expression: None, normal Lips and Perioral Area: None, normal Jaw: None, normal Tongue: None, normal,Extremity Movements Upper (arms, wrists, hands, fingers): None, normal Lower (legs, knees, ankles, toes): None, normal, Trunk Movements Neck, shoulders, hips: None, normal, Overall Severity Severity of abnormal movements (highest score from questions above): None, normal Incapacitation due to abnormal movements: None, normal Patient's awareness of abnormal movements (rate only patient's report): No Awareness, Dental Status Current problems with teeth and/or dentures?: No Does patient usually wear dentures?: No  CIWA:  CIWA-Ar Total: 2  COWS:     Treatment Plan Summary: Daily contact with patient to assess and evaluate symptoms and progress in treatment Medication management  Plan: 1. Will start patient on Seroquel XR 200mg  daily with supper for mood stabilization. 2. Will increase patient on Trileptal 300mg  twice a day for mood stabilization. 3. Patient encouraged to attend groups and other unit miliue.  Medical Decision Making Problem Points:  Established problem, stable/improving (1), New problem, with no additional work-up planned (3) and Review of psycho-social stressors (1) Data Points:  Order Aims Assessment (2) Review or order clinical lab tests (1) Review of medication regiment & side effects (2)  I  certify that inpatient services furnished can reasonably be expected to improve the patient's condition.   Thedore Mins, MD 07/14/2012, 10:17 AM

## 2012-07-14 NOTE — Progress Notes (Signed)
Patient ID: Connie [name], female   DOB: 28-Jul-1990, 22 y.o.   MRN: [id number] D: Patient lying in bed with eyes closed. Respirations even and non-labored. A: Staff will monitor on q 15 minute checks, follow treatment plan, and give meds. R: No response from patient due to sleeping.

## 2012-07-14 NOTE — Progress Notes (Signed)
Adult Psychoeducational Group Note  Date:  07/14/2012 Time:  9:54 AM  Group Topic/Focus:  Recovery Goals:   The focus of this group is to identify appropriate goals for recovery and establish a plan to achieve them.  Participation Level:  Active  Participation Quality:  Appropriate, Attentive and Sharing  Affect:  Appropriate  Cognitive:  Appropriate  Insight: Appropriate  Engagement in Group:  Engaged  Modes of Intervention:  Discussion  Additional Comments:  Pt was appropriate and attentive while attending group. Pt shared that her goal was to take her medications and attend groups so that she could work toward getting her baby back.   Sharyn Lull 07/14/2012, 9:54 AM

## 2012-07-14 NOTE — Progress Notes (Signed)
Patient ID: Connie [name], female   DOB: [birth year], 22 y.o.   MRN: [id number] PER STATE REGULATIONS 482.30  THIS CHART WAS REVIEWED FOR MEDICAL NECESSITY WITH RESPECT TO THE PATIENT'S ADMISSION/ DURATION OF STAY.  NEXT REVIEW DATE: 07/17/2012  Willa Rough, RN, BSN CASE MANAGER

## 2012-07-14 NOTE — Clinical Social Work Note (Signed)
[facility] LCSW Group Therapy  07/14/2012 , 1:31 PM   Type of Therapy:  Group Therapy  Participation Level:  Active  Participation Quality:  Attentive  Affect:  Appropriate  Cognitive:  Alert  Insight:  Improving  Engagement in Therapy:  Engaged  Modes of Intervention:  Discussion, Exploration and Socialization  Summary of Progress/Problems: Today's group focused on the term Diagnosis.  Participants were asked to define the term, and then pronounce whether it is a negative, positive or neutral term.  Connie [name] defined diagnosis as "finding out what is wrong with you."  She was invested in the discussion, but struggled with concepts that were a bit abstract.  Connie [name] B 07/14/2012 , 1:31 PM

## 2012-07-14 NOTE — Progress Notes (Signed)
Pt calm and cooperative, denies SI/HI/AVH at this time, attends and participates in groups, depressed today but states that she is starting to feel better and that she sees an improvement in her mood from when she was first admitted, support and encouragement provided, pt states that when she goes home her plan is to stay on her medications and go to her counselor regularly, pt states that she was supposed to go to counseling before however she turned it down because she didn't think that she needed it, pt admits now that she knows that it will be necessary for to have outpatient therapy after discharge.

## 2012-07-14 NOTE — Progress Notes (Signed)
Patient ID: Connie [name], female   DOB: 04-15-1991, 21 y.o.   MRN: [id number] Patient continues to improve with her treatment here.  She is compliant with her medications.  She denies SI/HI/AVH.  She rates her depression a 5 out of 10; she states she does not feel hopeless.  She hopes at some point to regain custody of her child, as her cousin is looking after the child until she is stabilized on her medications.  She is scheduled for discharge tomorrow.  She has complained of some nausea from her breast engorgement today, however, did not request any medication.  Continue to monitor medication management, MD orders and safety checks.  Collaborate with treatment team regarding patient's POC.  Encourage patient to attend groups and participate in her treatment.  Encourage and support patient as needed.  Patient's behavior has been appropriate.

## 2012-07-15 MED ORDER — OXCARBAZEPINE 300 MG PO TABS: 300.0000 mg | ORAL_TABLET | Freq: Two times a day (BID) | ORAL | Status: DC

## 2012-07-15 MED ORDER — QUETIAPINE FUMARATE ER 300 MG PO TB24: 300.0000 mg | ORAL_TABLET | Freq: Every day | ORAL | Status: DC

## 2012-07-15 MED ORDER — QUETIAPINE FUMARATE ER 300 MG PO TB24
300.0000 mg | ORAL_TABLET | Freq: Every day | ORAL | Status: DC
  Administered 2012-07-15 – 2012-07-19 (×5): 300 mg via ORAL
  Filled 2012-07-15: qty 1
  Filled 2012-07-15: qty 14
  Filled 2012-07-15: qty 1
  Filled 2012-07-15 (×4): qty 14
  Filled 2012-07-15: qty 1

## 2012-07-15 MED ORDER — HYDROCHLOROTHIAZIDE 25 MG PO TABS: 25.0000 mg | ORAL_TABLET | Freq: Every day | ORAL | Status: DC

## 2012-07-15 MED ORDER — PSYLLIUM 28 % PO PACK: 1.0000 | PACK | Freq: Two times a day (BID) | ORAL | Status: DC

## 2012-07-15 MED ORDER — FAMOTIDINE 20 MG PO TABS: 20.0000 mg | ORAL_TABLET | Freq: Two times a day (BID) | ORAL | Status: DC

## 2012-07-15 NOTE — Progress Notes (Signed)
[facility] Group Notes:  (Counselor/Nursing/MHT/Case Management/Adjunct)  05/15/2012 12:00 PM  Type of Therapy:  Group Therapy  Participation Level:  Active  Participation Quality:  Appropriate and Supportive  Affect:  Depressed  Cognitive:  Appropriate and Oriented  Insight:  Engaged  Engagement in Group:  Engaged  Engagement in Therapy:  Engaged and Supportive  Modes of Intervention:  Discussion, Socialization and Support  Summary of Progress/Problems: MHA: Patient was attentive and engaged with speaker from [employer]. Patient expressed interest in their programs and services. Patient became tearful when speaker played guitar. She thanked speaker for coming and expressed gratitude for his time.   Smart, Heather N 05/15/2012, 12:00 PM

## 2012-07-15 NOTE — Progress Notes (Signed)
The focus of this group is to help patients review their daily goal of treatment and discuss progress on daily workbooks. Pt attended the evening group session and participated in the discussion. Pt became tearful in the group when she brought up the subject of her child, but regained composure saying that she knows God will make it all right. Writer encouraged the Pt and reminded her that she can be in control of her future if she so chooses.

## 2012-07-15 NOTE — Progress Notes (Signed)
D: Patient denies SI/HI and A/V hallucinations; patient reports sleep to be fair; reports appetite to be improving; reports energy level is normal ; reports ability to pay attention to be improving; reports that she is not hopeless but did not rate any other symptoms  A: Monitored q 15 minutes; patient encouraged to attend groups; patient educated about medications; patient given medications per physician orders; patient encouraged to express feelings and/or concerns  R: Patient is guarded and cautious but she can open up once you begin to talk with her and she is cooperative; patient's interaction with staff and peers is appropriate but minimal;  patient is taking medications as prescribed and tolerating medications; patient is attending all groups

## 2012-07-15 NOTE — Progress Notes (Signed)
D: Pt. Has been sad this PM " missing my baby!" Pt has participated appropriately in all unit activities.Affect is sad & mood depressed.Pt takes scheduled medications as prescribed. Pt denies SI'HI' & AVH.Pt c/o H/A @ 1600 that was a "5" .& was given Tylenol with good relief.A: Supported & encouraged. Continues on 15 minute checks. R: Pt safety maintained.

## 2012-07-15 NOTE — Progress Notes (Signed)
Adult Psychoeducational Group Note  Date:  07/15/2012 Time:  5:13 PM  Group Topic/Focus:  Self Care:   The focus of this group is to help patients understand the importance of self-care in order to improve or restore emotional, physical, spiritual, interpersonal, and financial health.  Participation Level:  Active  Participation Quality:  Appropriate, Attentive and Sharing  Affect:  Appropriate  Cognitive:  Appropriate  Insight: Appropriate  Engagement in Group:  Engaged  Modes of Intervention:  Discussion  Additional Comments:  Pt was appropriate and attentive while attending group. Pt shared that she wants to take better care of herself for her baby. Pt also stated that music helps to keep her calm and makes her feel better.   Sharyn Lull 07/15/2012, 5:13 PM

## 2012-07-15 NOTE — Progress Notes (Signed)
[facility] LCSW Aftercare Discharge Planning Group Note  07/15/2012 9:25 AM  Participation Quality:  Appropriate  Affect:  Irritable  Cognitive:  Appropriate  Insight:  Engaged  Engagement in Group:  Engaged and Limited  Modes of Intervention:  Exploration, Socialization and Support  Summary of Progress/Problems: Pt reported that she slept well last night due to medications taken prior to bedtime. She requested to speak with doctor today regarding discharge.   Smart, Heather N 07/15/2012, 9:25 AM

## 2012-07-15 NOTE — Progress Notes (Signed)
Patient ID: Connie [name], female   DOB: [birth year], 22 y.o.   MRN: [id number] [facility] MD Progress Note  07/15/2012 11:40 AM Connie [name]  MRN:  [id number]  Subjective: Patient reports decreased mood swings and feeling less hopeless or depressed. Patient continue to reports that she is missing her new born baby and she is requesting to be discharged soon. Patient denies suicidal or homicidal ideations, intent or plan. Patient is compliant with her medications and has no adverse reactions to it.  Diagnosis:  Bipolar I disorder most recent episode depressed  ADL's:  Intact  Sleep: Fair  Appetite:  Fair  Suicidal Ideation:  denies Homicidal Ideation:  denies AEB (as evidenced by):patient's affect, verbal response and written response to daily self enquiry.  Psychiatric Specialty Exam: Review of Systems  Constitutional: Negative.  Negative for fever, chills, weight loss, malaise/fatigue and diaphoresis.  HENT: Negative.  Negative for congestion and sore throat.   Eyes: Negative.  Negative for blurred vision, double vision and photophobia.  Respiratory: Negative.  Negative for cough, shortness of breath and wheezing.   Cardiovascular: Negative.  Negative for palpitations and PND. Chest pain: due to breast engorgement secondary to lactation.  Gastrointestinal: Negative.  Negative for heartburn, nausea, vomiting, abdominal pain, diarrhea and constipation.  Genitourinary: Negative.   Musculoskeletal: Negative.  Negative for myalgias, joint pain and falls.  Skin: Negative.   Neurological: Negative.  Negative for dizziness, tingling, tremors, sensory change, speech change, focal weakness, seizures, loss of consciousness, weakness and headaches.  Endo/Heme/Allergies: Negative.  Negative for polydipsia. Does not bruise/bleed easily.  Psychiatric/Behavioral: Positive for depression. Negative for suicidal ideas, hallucinations, memory loss and substance abuse. The patient is nervous/anxious  and has insomnia.     Blood pressure 140/96, pulse 122, temperature 98.8 F (37.1 C), temperature source Oral, resp. rate 19, height 4\' 11"  (1.499 m), weight 76.204 kg (168 lb), last menstrual period 09/05/2011, not currently breastfeeding.Body mass index is 33.93 kg/(m^2).  General Appearance: Fairly Groomed  Patent attorney::  Good  Speech:  Clear and Coherent  Volume:  Normal  Mood:  Depressed  Affect:  Depressed guarded. Does not want to discuss issues with baby's father that caused her to be in hospital.  Thought Process:  Goal Directed  Orientation:  Full (Time, Place, and Person)  Thought Content:  NA  Suicidal Thoughts:  No  Homicidal Thoughts:  No  Memory:  Immediate;   Good  Judgement:  Impaired  Insight:  Lacking  Psychomotor Activity:  Normal  Concentration:  Fair  Recall:  Fair  Akathisia:  No  Handed:  Right  AIMS (if indicated):     Assets:  Resilience  Sleep:  Number of Hours: 5.25    Current Medications: Current Facility-Administered Medications  Medication Dose Route Frequency Provider Last Rate Last Dose  . acetaminophen (TYLENOL) tablet 650 mg  650 mg Oral Q6H PRN Shuvon Rankin, NP   650 mg at 07/12/12 0403  . alum & mag hydroxide-simeth (MAALOX/MYLANTA) 200-200-20 MG/5ML suspension 30 mL  30 mL Oral Q4H PRN Shuvon Rankin, NP   30 mL at 07/11/12 0253  . calcium carbonate (TUMS - dosed in mg elemental calcium) chewable tablet 400 mg of elemental calcium  2 tablet Oral BID PRN Fredrik Cove, PA-C      . doxepin (SINEQUAN) capsule 50 mg  50 mg Oral QHS PRN Zackeriah Kissler   50 mg at 07/14/12 2129  . famotidine (PEPCID) tablet 20 mg  20 mg Oral BID Bethanie Dicker  Adams, PA-C   20 mg at 07/15/12 0810  . hydrochlorothiazide (HYDRODIURIL) tablet 25 mg  25 mg Oral Daily Fredrik Cove, PA-C   25 mg at 07/15/12 0810  . ibuprofen (ADVIL,MOTRIN) tablet 600 mg  600 mg Oral Q6H Fredrik Cove, PA-C   600 mg at 07/14/12 1703  . LORazepam (ATIVAN) tablet 1 mg  1 mg Oral Q8H  PRN Shuvon Rankin, NP   1 mg at 07/14/12 1549  . magnesium hydroxide (MILK OF MAGNESIA) suspension 30 mL  30 mL Oral Daily PRN Shuvon Rankin, NP      . Oxcarbazepine (TRILEPTAL) tablet 300 mg  300 mg Oral BID Loana Salvaggio   300 mg at 07/15/12 0810  . psyllium (HYDROCIL/METAMUCIL) packet 1 packet  1 packet Oral Daily Fredrik Cove, PA-C      . QUEtiapine (SEROQUEL XR) 24 hr tablet 300 mg  300 mg Oral QPC supper Doniel Maiello        Lab Results:  No results found for this or any previous visit (from the past 48 hour(s)).  Physical Findings: AIMS: Facial and Oral Movements Muscles of Facial Expression: None, normal Lips and Perioral Area: None, normal Jaw: None, normal Tongue: None, normal,Extremity Movements Upper (arms, wrists, hands, fingers): None, normal Lower (legs, knees, ankles, toes): None, normal, Trunk Movements Neck, shoulders, hips: None, normal, Overall Severity Severity of abnormal movements (highest score from questions above): None, normal Incapacitation due to abnormal movements: None, normal Patient's awareness of abnormal movements (rate only patient's report): No Awareness, Dental Status Current problems with teeth and/or dentures?: No Does patient usually wear dentures?: No  CIWA:  CIWA-Ar Total: 2  COWS:     Treatment Plan Summary: Daily contact with patient to assess and evaluate symptoms and progress in treatment Medication management  Plan: 1. Will increase Seroquel XR to 300mg  daily with supper for mood stabilization. 2. Will continue patient on Trileptal 300mg  twice a day for mood stabilization. 3. Patient encouraged to attend groups and other unit miliue.  Medical Decision Making Problem Points:  Established problem, stable/improving (1), New problem, with no additional work-up planned (3) and Review of psycho-social stressors (1) Data Points:  Order Aims Assessment (2) Review or order clinical lab tests (1) Review of medication regiment &  side effects (2)  I certify that inpatient services furnished can reasonably be expected to improve the patient's condition.   Thedore Mins, MD 07/15/2012, 11:40 AM

## 2012-07-16 DIAGNOSIS — F3130 Bipolar disorder, current episode depressed, mild or moderate severity, unspecified: Secondary | ICD-10-CM

## 2012-07-16 LAB — CBC WITH DIFFERENTIAL/PLATELET
Basophils Absolute: 0 10*3/uL (ref 0.0–0.1)
Basophils Relative: 0 % (ref 0–1)
Eosinophils Absolute: 0.2 10*3/uL (ref 0.0–0.7)
Eosinophils Relative: 2 % (ref 0–5)
HCT: 41.9 % (ref 36.0–46.0)
Hemoglobin: 13.8 g/dL (ref 12.0–15.0)
Lymphocytes Relative: 38 % (ref 12–46)
Lymphs Abs: 3 10*3/uL (ref 0.7–4.0)
MCH: 29.3 pg (ref 26.0–34.0)
MCHC: 32.9 g/dL (ref 30.0–36.0)
MCV: 89 fL (ref 78.0–100.0)
Monocytes Absolute: 0.6 10*3/uL (ref 0.1–1.0)
Monocytes Relative: 8 % (ref 3–12)
Neutro Abs: 4.2 10*3/uL (ref 1.7–7.7)
Neutrophils Relative %: 52 % (ref 43–77)
Platelets: 444 10*3/uL — ABNORMAL HIGH (ref 150–400)
RBC: 4.71 MIL/uL (ref 3.87–5.11)
RDW: 14.9 % (ref 11.5–15.5)
WBC: 8 10*3/uL (ref 4.0–10.5)

## 2012-07-16 LAB — COMPREHENSIVE METABOLIC PANEL
ALT: 21 U/L (ref 0–35)
AST: 19 U/L (ref 0–37)
Albumin: 3.9 g/dL (ref 3.5–5.2)
Alkaline Phosphatase: 107 U/L (ref 39–117)
BUN: 10 mg/dL (ref 6–23)
CO2: 25 mEq/L (ref 19–32)
Calcium: 10 mg/dL (ref 8.4–10.5)
Chloride: 94 mEq/L — ABNORMAL LOW (ref 96–112)
Creatinine, Ser: 0.76 mg/dL (ref 0.50–1.10)
GFR calc Af Amer: >90 mL/min (ref >90)
GFR calc non Af Amer: >90 mL/min (ref >90)
Glucose, Bld: 94 mg/dL (ref 70–99)
Potassium: 3.5 mEq/L (ref 3.5–5.1)
Sodium: 134 mEq/L — ABNORMAL LOW (ref 135–145)
Total Bilirubin: 0.3 mg/dL (ref 0.3–1.2)
Total Protein: 7.2 g/dL (ref 6.0–8.3)

## 2012-07-16 MED ORDER — PSYLLIUM 95 % PO PACK
1.0000 | PACK | Freq: Two times a day (BID) | ORAL | Status: DC | PRN
  Filled 2012-07-16: qty 10

## 2012-07-16 NOTE — Progress Notes (Signed)
At 2315 pt was concerned on when she will receive her 0000 Ibuprofen and sleep med. Pt was informed that she can get her Ibuprofen now. Writer brought Ibuprofen and pt refused and denied any pain. Pt informed of having the opportunity to take the ibuprofen up until 0100. Pt was also informed that her Sinequan was already administered at 2147. Pt recalls getting the med and was receptive to the information about her ibuprofen. At this time pt denies any concerns she wishes for this writer to address at this time. Pt remains safe with q79min checks.

## 2012-07-16 NOTE — Progress Notes (Signed)
Psychoeducational Group Note  Date:  07/16/2012 Time:  0930  Group Topic/Focus:  Self Esteem Action Plan:   The focus of this group is to help patients create a plan to continue to build self-esteem after discharge.  Participation Level: Did Not Attend  Participation Quality:  Not Applicable  Affect:  Not Applicable  Cognitive:  Not Applicable  Insight:  Not Applicable  Engagement in Group: Not Applicable  Additional Comments:  Pt refused to come to group this morning.   Danyel Tobey E 07/16/2012, 1:23 PM

## 2012-07-16 NOTE — Progress Notes (Signed)
Psychoeducational Group Note  Date:  07/16/2012 Time:  2000  Group Topic/Focus:  karaoke group  Participation Level: Did Not Attend  Participation Quality:  Not Applicable  Affect:  Not Applicable  Cognitive:  Not Applicable  Insight:  Not Applicable  Engagement in Group: Not Applicable  Additional Comments:    Maretta Los 07/16/2012, 10:17 PM

## 2012-07-16 NOTE — Progress Notes (Signed)
Patient ID: Connie [name], female   DOB: April 03, 1991, 22 y.o.   MRN: [id number] [facility] MD Progress Note  07/16/2012 4:12 PM Connie [name]  MRN:  [id number]  Subjective:"Please have a heart and send me home. I'm ready to go home." Patient then breaks into tears. She is unable to tell me what is bothering her, unable to verbalize why she has had a sudden change in behaviors.  She is tachycardic today and EKG is ordered. Diagnosis:  Bipolar I disorder most recent episode depressed  ADL's:  Intact  Sleep: Fair  Appetite:  Fair  Suicidal Ideation:  denies Homicidal Ideation:  denies AEB (as evidenced by):patient's affect, verbal response and written response to daily self enquiry.  Psychiatric Specialty Exam: Review of Systems  Constitutional: Negative.  Negative for fever, chills, weight loss, malaise/fatigue and diaphoresis.  HENT: Negative for congestion and sore throat.   Eyes: Negative for blurred vision, double vision and photophobia.  Respiratory: Negative for cough, shortness of breath and wheezing.   Cardiovascular: Negative for chest pain, palpitations and PND.  Gastrointestinal: Negative for heartburn, nausea, vomiting, abdominal pain, diarrhea and constipation.  Musculoskeletal: Negative for myalgias, joint pain and falls.  Neurological: Negative for dizziness, tingling, tremors, sensory change, speech change, focal weakness, seizures, loss of consciousness, weakness and headaches.  Endo/Heme/Allergies: Negative for polydipsia. Does not bruise/bleed easily.  Psychiatric/Behavioral: Negative for depression, suicidal ideas, hallucinations, memory loss and substance abuse. The patient is not nervous/anxious and does not have insomnia.     Blood pressure 139/89, pulse 121, temperature 97 F (36.1 C), temperature source Oral, resp. rate 18, height 4\' 11"  (1.499 m), weight 76.204 kg (168 lb), not currently breastfeeding.Body mass index is 33.93 kg/(m^2).  General Appearance:  Fairly Groomed  Patent attorney::  Good  Speech:  Clear and Coherent  Volume:  Normal  Mood:  Depressed "I hope there IS something wrong with my heart!"  Affect:  Tearful and guarded. Will not state cause of her distress.  Thought Process:  Goal Directed  Orientation:  Full (Time, Place, and Person)  Thought Content:  NA  Suicidal Thoughts:  No  Homicidal Thoughts:  No  Memory:  Immediate;   Good  Judgement:  Impaired  Insight:  Lacking  Psychomotor Activity:  Normal  Concentration:  Fair  Recall:  Fair  Akathisia:  No  Handed:  Right  AIMS (if indicated):     Assets:  Resilience  Sleep:  Number of Hours: 6.5    Current Medications: Current Facility-Administered Medications  Medication Dose Route Frequency Provider Last Rate Last Dose  . acetaminophen (TYLENOL) tablet 650 mg  650 mg Oral Q6H PRN Shuvon Rankin, NP   650 mg at 07/15/12 1600  . alum & mag hydroxide-simeth (MAALOX/MYLANTA) 200-200-20 MG/5ML suspension 30 mL  30 mL Oral Q4H PRN Shuvon Rankin, NP   30 mL at 07/11/12 0253  . calcium carbonate (TUMS - dosed in mg elemental calcium) chewable tablet 400 mg of elemental calcium  2 tablet Oral BID PRN Fredrik Cove, PA-C      . doxepin (SINEQUAN) capsule 50 mg  50 mg Oral QHS PRN Mojeed Akintayo   50 mg at 07/15/12 2147  . famotidine (PEPCID) tablet 20 mg  20 mg Oral BID Fredrik Cove, PA-C   20 mg at 07/16/12 7829  . hydrochlorothiazide (HYDRODIURIL) tablet 25 mg  25 mg Oral Daily Fredrik Cove, PA-C   25 mg at 07/16/12 5621  . ibuprofen (ADVIL,MOTRIN) tablet 600 mg  600 mg Oral Q6H Fredrik Cove, PA-C   600 mg at 07/16/12 1210  . LORazepam (ATIVAN) tablet 1 mg  1 mg Oral Q8H PRN Shuvon Rankin, NP   1 mg at 07/14/12 1549  . magnesium hydroxide (MILK OF MAGNESIA) suspension 30 mL  30 mL Oral Daily PRN Shuvon Rankin, NP      . Oxcarbazepine (TRILEPTAL) tablet 300 mg  300 mg Oral BID Mojeed Akintayo   300 mg at 07/16/12 0752  . psyllium (HYDROCIL/METAMUCIL) packet 1  packet  1 packet Oral Daily Fredrik Cove, PA-C      . QUEtiapine (SEROQUEL XR) 24 hr tablet 300 mg  300 mg Oral QPC supper Mojeed Akintayo   300 mg at 07/15/12 1724    Lab Results:  No results found for this or any previous visit (from the past 48 hour(s)).  Physical Findings: AIMS: Facial and Oral Movements Muscles of Facial Expression: None, normal Lips and Perioral Area: None, normal Jaw: None, normal Tongue: None, normal,Extremity Movements Upper (arms, wrists, hands, fingers): None, normal Lower (legs, knees, ankles, toes): None, normal, Trunk Movements Neck, shoulders, hips: None, normal, Overall Severity Severity of abnormal movements (highest score from questions above): None, normal Incapacitation due to abnormal movements: None, normal Patient's awareness of abnormal movements (rate only patient's report): No Awareness, Dental Status Current problems with teeth and/or dentures?: No Does patient usually wear dentures?: No  CIWA:  CIWA-Ar Total: 2  COWS:     Treatment Plan Summary: Daily contact with patient to assess and evaluate symptoms and progress in treatment Medication management  Plan: 1. Will order EKG to evaluate tachycardia.  2. Will continue patient on Trileptal and Seroquel for mood stabilization. 3. Will order CBC w/diff, TSH, and CMP to further evaluate. 4. As patient is showing worsening symptoms of depression, isolation, poor judgement, and lack of participation with guarded behaviors will continue in patient hospitalization at this time. Medical Decision Making Problem Points:  Established problem, worsening (2), New problem, with additional work-up planned (4) and Review of psycho-social stressors (1) Data Points:  Independent review of image, tracing, or specimen (2) Order Aims Assessment (2) Review or order clinical lab tests (1) Review of medication regiment & side effects (2)  I certify that inpatient services furnished can reasonably be  expected to improve the patient's condition.  Rona Ravens. Kaylee Trivett PAC 07/16/2012, 4:12 PM

## 2012-07-16 NOTE — Progress Notes (Signed)
[facility] LCSW Group Therapy  07/16/2012 9:18 AM  Type of Therapy:  Found Tewana in bed at group time.  Refused to come to group.  "You can't do anything for me."      Ida Rogue 07/16/2012, 9:18 AM

## 2012-07-16 NOTE — Progress Notes (Signed)
[facility] Group Notes:  (Nursing/MHT/Case Management/Adjunct)  Date:  07/16/2012  Time:  1:57 PM  Type of Therapy:  Psychoeducational Skills  Summary of Progress/Problems: Marieta was sleeping did not attend psychoeducational group on relaxation.  Wandra Scot 07/16/2012, 1:57 PM

## 2012-07-16 NOTE — Progress Notes (Signed)
[facility] Group Notes:  (Counselor/Nursing/MHT/Case Management/Adjunct)  07/16/2012 11:50 AM   Type of Therapy:  Group Therapy   DID NOT ATTEND   Smart, Ledell Peoples 05/14/2012, 2:34 PM

## 2012-07-16 NOTE — Progress Notes (Signed)
Patient ID: Connie [name], female   DOB: 1991-01-11, 22 y.o.   MRN: [id number] Patient has been irritable and labile today.  She initiated a verbal altercation with another patient today.  She refuses to talk to staff stating, "you can't help me."  During medication pass this morning, she was focused on her "medical condition" stating "I've got a bad heart; my pulse rate is very high."  An EKG was performed and she has NSR with tachycardia.  She is focused on going home, however, is not ready at this time.  She is refusing to attend group today.  She denies SI/HI/AVH.  Continue to monitor medication management and MD orders.  Collaborate with treatment team regarding patient's POC.  Safety checks completed every 15 minutes per protocol.  Redirect patient as needed; support and encourage patient.

## 2012-07-16 NOTE — Progress Notes (Signed)
Patient ID: SMT LOKEY, female   DOB: 09-19-1990, 22 y.o.   MRN: [id number]  D: Pt denies SI/HI/AVH. Pt complains of no pain at this time. Pt states" I don't need to be here, I can't get the help I need, I don't want to open up to ya'll". Pt continues to exhibit blocking and continues to exhibit labile behavior.   A: Pt was offered support and encouragement. Pt was given scheduled medications. Pt was encourage to attend groups. Q 15 minute checks were done for safety.     R: Pt is taking medication.  safety maintained on unit.

## 2012-07-17 LAB — TSH: TSH: 0.64 u[IU]/mL (ref 0.350–4.500)

## 2012-07-17 NOTE — Progress Notes (Signed)
[facility] Group Notes:  (Counselor/Nursing/MHT/Case Management/Adjunct)  05/15/2012 12:00 PM  Type of Therapy:  Group Therapy  Participation Level:  Did Not Attend  * Pt initially came to group after being coerced by staff. Pt began to panic after sitting down and stated that she did not feel safe. When asked what made her feel unsafe, she could not answer. I asked her if she felt that she may harm herself and she said no. I asked her if she felt unsafe in the room, around others, or around staff and she said no. She began to hyperventilate and said she felt safe only in her room. Patient was excused to go to her room.   Smart, Heather N 05/15/2012, 12:00 PM

## 2012-07-17 NOTE — Clinical Social Work Note (Signed)
Pt disclosed that she no longer has a place to go after discharge and is in need of help in finding somewhere to go. Pt is unable to talk further about this but was told that she would receive assistance in finding a safe place to stay before discharge.

## 2012-07-17 NOTE — Progress Notes (Signed)
Psychoeducational Group Note  Date:  07/17/2012 Time:  2000  Group Topic/Focus:  Wrap-Up Group:   The focus of this group is to help patients review their daily goal of treatment and discuss progress on daily workbooks.  Participation Level: Did Not Attend  Participation Quality:  Not Applicable  Affect:  Not Applicable  Cognitive:  Not Applicable  Insight:  Not Applicable  Engagement in Group: Not Applicable  Additional Comments:  The patient did not attend group this evening and remained in her room.   Hazle Coca S 07/17/2012, 11:56 PM

## 2012-07-17 NOTE — Progress Notes (Signed)
Patient ID: MARTRICE APT, female   DOB: 1990-08-12, 22 y.o.   MRN: [id number] PER STATE REGULATIONS 482.30  THIS CHART WAS REVIEWED FOR MEDICAL NECESSITY WITH RESPECT TO THE PATIENT'S ADMISSION/ DURATION OF STAY.  NEXT REVIEW DATE: 07/20/2012  Willa Rough, RN, BSN CASE MANAGER

## 2012-07-17 NOTE — Progress Notes (Signed)
Patient ID: Connie [name], female   DOB: Oct 23, 1990, 22 y.o.   MRN: [id number] Patient ID: Connie [name], female   DOB: 03/22/1991, 22 y.o.   MRN: [id number] [facility] MD Progress Note  07/17/2012 10:27 AM Connie [name]  MRN:  [id number]  Subjective: "I need to leave this place. I have been here since last Saturday. I am not a psycho. There are other people here screaming their heads off, wanting to kill themselves. I'm not like them. All I did was getting off of my medicines for 9 months because I was pregnant.  I did not want to hurt my baby. I want to stay in my room. I don't want to come out of my room and be out there with these weird people. I see the young man walking around on the hallway looking really weird. I am not suicidal. But I feel very sad. I need to go home".  Diagnosis:  Bipolar I disorder most recent episode depressed  ADL's:  Intact  Sleep: Fair  Appetite:  Fair  Suicidal Ideation: "No" denies Homicidal Ideation: "No" denies  AEB (as evidenced by): Per patient's reports.  Psychiatric Specialty Exam: Review of Systems  Constitutional: Negative.  Negative for fever, chills, weight loss, malaise/fatigue and diaphoresis.  HENT: Negative.  Negative for congestion and sore throat.   Eyes: Negative.  Negative for blurred vision, double vision and photophobia.  Respiratory: Negative.  Negative for cough, shortness of breath and wheezing.   Cardiovascular: Negative.  Negative for chest pain, palpitations and PND.  Gastrointestinal: Negative.  Negative for heartburn, nausea, vomiting, abdominal pain, diarrhea and constipation.  Genitourinary: Negative.   Musculoskeletal: Negative.  Negative for myalgias, joint pain and falls.  Skin: Negative.   Neurological: Negative.  Negative for dizziness, tingling, tremors, sensory change, speech change, focal weakness, seizures, loss of consciousness, weakness and headaches.  Endo/Heme/Allergies: Negative.  Negative for  polydipsia. Does not bruise/bleed easily.  Psychiatric/Behavioral: Positive for depression (Presenting symptoms of depression. ). Negative for suicidal ideas, hallucinations, memory loss and substance abuse. The patient is not nervous/anxious and does not have insomnia.        Denies feeling and or being depressed, however, Connie [name] is presenting frequent crying spells and reporting increased sadness and self isolation.    Blood pressure 118/76, pulse 120, temperature 97.6 F (36.4 C), temperature source Oral, resp. rate 20, height 4\' 11"  (1.499 m), weight 76.204 kg (168 lb), not currently breastfeeding.Body mass index is 33.93 kg/(m^2).  General Appearance: Fairly Groomed  Patent attorney::  Good  Speech:  Clear and Coherent  Volume:  Normal  Mood: "I'm not depressed", however presents crying spells, reports sadness, is self Isolating.  Affect:  Flat,Tearful, states the reason for her crying spells is because she does not belong in here".  Thought Process:  Goal Directed  Orientation:  Full (Time, Place, and Person)  Thought Content:  Denies hallucinations, delusions and or paranoia.  Suicidal Thoughts:  No  Homicidal Thoughts:  No  Memory:  Immediate;   Good  Judgement:  Fair  Insight:  Fairly present  Psychomotor Activity:  Normal  Concentration:  Fair  Recall:  Fair  Akathisia:  No  Handed:  Right  AIMS (if indicated):     Assets:  Resilience  Sleep:  Number of Hours: 5    Current Medications: Current Facility-Administered Medications  Medication Dose Route Frequency Provider Last Rate Last Dose  . acetaminophen (TYLENOL) tablet 650 mg  650 mg Oral Q6H PRN  Shuvon Rankin, NP   650 mg at 07/15/12 1600  . alum & mag hydroxide-simeth (MAALOX/MYLANTA) 200-200-20 MG/5ML suspension 30 mL  30 mL Oral Q4H PRN Shuvon Rankin, NP   30 mL at 07/11/12 0253  . calcium carbonate (TUMS - dosed in mg elemental calcium) chewable tablet 400 mg of elemental calcium  2 tablet Oral BID PRN Fredrik Cove, PA-C      . doxepin (SINEQUAN) capsule 50 mg  50 mg Oral QHS PRN Mojeed Akintayo   50 mg at 07/15/12 2147  . famotidine (PEPCID) tablet 20 mg  20 mg Oral BID Fredrik Cove, PA-C   20 mg at 07/16/12 1610  . hydrochlorothiazide (HYDRODIURIL) tablet 25 mg  25 mg Oral Daily Fredrik Cove, PA-C   25 mg at 07/16/12 9604  . ibuprofen (ADVIL,MOTRIN) tablet 600 mg  600 mg Oral Q6H Fredrik Cove, PA-C   600 mg at 07/16/12 1210  . LORazepam (ATIVAN) tablet 1 mg  1 mg Oral Q8H PRN Shuvon Rankin, NP   1 mg at 07/14/12 1549  . magnesium hydroxide (MILK OF MAGNESIA) suspension 30 mL  30 mL Oral Daily PRN Shuvon Rankin, NP      . Oxcarbazepine (TRILEPTAL) tablet 300 mg  300 mg Oral BID Mojeed Akintayo   300 mg at 07/16/12 0752  . psyllium (HYDROCIL/METAMUCIL) packet 1 packet  1 packet Oral Daily Fredrik Cove, PA-C      . QUEtiapine (SEROQUEL XR) 24 hr tablet 300 mg  300 mg Oral QPC supper Mojeed Akintayo   300 mg at 07/15/12 1724    Lab Results:  Results for orders placed during the hospital encounter of 07/11/12 (from the past 48 hour(s))  TSH     Status: Normal   Collection Time   07/16/12  7:59 PM      Component Value Range Comment   TSH 0.640  0.350 - 4.500 uIU/mL   CBC WITH DIFFERENTIAL     Status: Abnormal   Collection Time   07/16/12  7:59 PM      Component Value Range Comment   WBC 8.0  4.0 - 10.5 K/uL    RBC 4.71  3.87 - 5.11 MIL/uL    Hemoglobin 13.8  12.0 - 15.0 g/dL    HCT 54.0  98.1 - 19.1 %    MCV 89.0  78.0 - 100.0 fL    MCH 29.3  26.0 - 34.0 pg    MCHC 32.9  30.0 - 36.0 g/dL    RDW 47.8  29.5 - 62.1 %    Platelets 444 (*) 150 - 400 K/uL    Neutrophils Relative 52  43 - 77 %    Neutro Abs 4.2  1.7 - 7.7 K/uL    Lymphocytes Relative 38  12 - 46 %    Lymphs Abs 3.0  0.7 - 4.0 K/uL    Monocytes Relative 8  3 - 12 %    Monocytes Absolute 0.6  0.1 - 1.0 K/uL    Eosinophils Relative 2  0 - 5 %    Eosinophils Absolute 0.2  0.0 - 0.7 K/uL    Basophils Relative 0  0 -  1 %    Basophils Absolute 0.0  0.0 - 0.1 K/uL   COMPREHENSIVE METABOLIC PANEL     Status: Abnormal   Collection Time   07/16/12  7:59 PM      Component Value Range Comment   Sodium 134 (*) 135 - 145 mEq/L  Potassium 3.5  3.5 - 5.1 mEq/L    Chloride 94 (*) 96 - 112 mEq/L    CO2 25  19 - 32 mEq/L    Glucose, Bld 94  70 - 99 mg/dL    BUN 10  6 - 23 mg/dL    Creatinine, Ser 1.61  0.50 - 1.10 mg/dL    Calcium 09.6  8.4 - 10.5 mg/dL    Total Protein 7.2  6.0 - 8.3 g/dL    Albumin 3.9  3.5 - 5.2 g/dL    AST 19  0 - 37 U/L    ALT 21  0 - 35 U/L    Alkaline Phosphatase 107  39 - 117 U/L    Total Bilirubin 0.3  0.3 - 1.2 mg/dL    GFR calc non Af Amer >90  >90 mL/min    GFR calc Af Amer >90  >90 mL/min     Physical Findings: AIMS: Facial and Oral Movements Muscles of Facial Expression: None, normal Lips and Perioral Area: None, normal Jaw: None, normal Tongue: None, normal,Extremity Movements Upper (arms, wrists, hands, fingers): None, normal Lower (legs, knees, ankles, toes): None, normal, Trunk Movements Neck, shoulders, hips: None, normal, Overall Severity Severity of abnormal movements (highest score from questions above): None, normal Incapacitation due to abnormal movements: None, normal Patient's awareness of abnormal movements (rate only patient's report): No Awareness, Dental Status Current problems with teeth and/or dentures?: No Does patient usually wear dentures?: No  CIWA:  CIWA-Ar Total: 2  COWS:     Treatment Plan Summary: Daily contact with patient to assess and evaluate symptoms and progress in treatment Medication management  Plan:  Supportive approach/coping skills. Will initiate Fluoxetine 10 mg daily for symptoms of depression. Encouraged out of room, participation in group sessions and application of coping skills when distressed. Will continue to monitor response to/adverse effects of medications in use to assure effectiveness. Continue to monitor mood,  behavior and interaction with staff and other patients. Continue current plan of care.   Medical Decision Making Problem Points:  Established problem, worsening (2), New problem, with additional work-up planned (4) and Review of psycho-social stressors (1) Data Points:  Independent review of image, tracing, or specimen (2) Order Aims Assessment (2) Review or order clinical lab tests (1) Review of medication regiment & side effects (2)  I certify that inpatient services furnished can reasonably be expected to improve the patient's condition.  Rona Ravens. Mashburn PAC 07/17/2012, 10:27 AM

## 2012-07-17 NOTE — Progress Notes (Signed)
Adult Psychoeducational Group Note  Date:  07/17/2012 Time:  930  Group Topic/Focus:  Relapse Prevention Planning:   The focus of this group is to define relapse and discuss the need for planning to combat relapse.  Participation Level:  Did Not Attend   Nile Dear 07/17/2012, 10:40 AM

## 2012-07-17 NOTE — Progress Notes (Signed)
[facility], [place] LCSW Aftercare Discharge Planning Group Note  07/17/2012 9:15 AM  Participation Quality:  Did not attend    Ida Rogue 07/17/2012, 9:15 AM

## 2012-07-17 NOTE — Progress Notes (Signed)
D:Pt is guarded and paranoid reporting that she does not feel safe. When writer asked pt what was making her feel unsafe she refused to answer, went to the restroom and shut the door. When writer encouraged pt to attend group she walked in the group room and then walked out saying "I can't."  A:Supported pt to discuss feelings. Gave medications as ordered and 15 minute checks. Encouraged groups and pt to stay out of her room. R:Pt continues to isolate to her room. She does go to [employer] for meals. Pt denies si and hi. She denies hallucinations. Pt is slow to respond to questions and appears to be thought blocking at times. Safety maintained on the unit.

## 2012-07-18 NOTE — Progress Notes (Signed)
Psychoeducational Group Note  Date:  07/18/2012 Time:0930am  Group Topic/Focus:  Identifying Needs:   The focus of this group is to help patients identify their personal needs that have been historically problematic and identify healthy behaviors to address their needs.  Participation Level:  Active  Participation Quality:  Appropriate  Affect:  Appropriate  Cognitive:  Appropriate  Insight:  Supportive  Engagement in Group:  Supportive  Additional Comments:  Inventory sheet group   Connie [name] 07/18/2012,10:52 AM

## 2012-07-18 NOTE — Progress Notes (Signed)
Patient ID: Connie [name], female   DOB: Jun 28, 1991, 22 y.o.   MRN: [id number] [facility] MD Progress Note  07/18/2012 12:22 PM Connie [name]  MRN:  [id number]  Subjective:   Very sleepy today this AM. Thinks her mood is getting better now. No new acute new issues. Able to tolerate meds well.  Diagnosis:  Bipolar I disorder most recent episode depressed  ADL's:  Intact  Sleep: Fair  Appetite:  Fair  Suicidal Ideation: "No" denies Homicidal Ideation: "No" denies  AEB (as evidenced by): Per patient's reports.  Psychiatric Specialty Exam: Review of Systems  Constitutional: Negative.  Negative for fever, chills, weight loss, malaise/fatigue and diaphoresis.  HENT: Negative.  Negative for congestion and sore throat.   Eyes: Negative.  Negative for blurred vision, double vision and photophobia.  Respiratory: Negative.  Negative for cough, shortness of breath and wheezing.   Cardiovascular: Negative.  Negative for chest pain, palpitations and PND.  Gastrointestinal: Negative.  Negative for heartburn, nausea, vomiting, abdominal pain, diarrhea and constipation.  Genitourinary: Negative.   Musculoskeletal: Negative.  Negative for myalgias, joint pain and falls.  Skin: Negative.   Neurological: Negative.  Negative for dizziness, tingling, tremors, sensory change, speech change, focal weakness, seizures, loss of consciousness, weakness and headaches.  Endo/Heme/Allergies: Negative.  Negative for polydipsia. Does not bruise/bleed easily.  Psychiatric/Behavioral: Positive for depression (Presenting symptoms of depression. ). Negative for suicidal ideas, hallucinations, memory loss and substance abuse. The patient is not nervous/anxious and does not have insomnia.        Denies feeling and or being depressed, however, Connie [name] is presenting frequent crying spells and reporting increased sadness and self isolation.    Blood pressure 131/84, pulse 110, temperature 97 F (36.1 C),  temperature source Oral, resp. rate 18, height 4\' 11"  (1.499 m), weight 76.204 kg (168 lb), not currently breastfeeding.Body mass index is 33.93 kg/(m^2).  General Appearance: Fairly Groomed  Patent attorney::  Good  Speech:  Clear and Coherent  Volume:  Normal  Mood: better  Affect:  Flat.  Thought Process:  Goal Directed  Orientation:  Full (Time, Place, and Person)  Thought Content:  Denies hallucinations, delusions and or paranoia.  Suicidal Thoughts:  No  Homicidal Thoughts:  No  Memory:  Immediate;   Good  Judgement:  Fair  Insight:  Fairly present  Psychomotor Activity:  Normal  Concentration:  Fair  Recall:  Fair  Akathisia:  No  Handed:  Right  AIMS (if indicated):     Assets:  Resilience  Sleep:  Number of Hours: 5.5    Current Medications: Current Facility-Administered Medications  Medication Dose Route Frequency Provider Last Rate Last Dose  . acetaminophen (TYLENOL) tablet 650 mg  650 mg Oral Q6H PRN Shuvon Rankin, NP   650 mg at 07/15/12 1600  . alum & mag hydroxide-simeth (MAALOX/MYLANTA) 200-200-20 MG/5ML suspension 30 mL  30 mL Oral Q4H PRN Shuvon Rankin, NP   30 mL at 07/11/12 0253  . calcium carbonate (TUMS - dosed in mg elemental calcium) chewable tablet 400 mg of elemental calcium  2 tablet Oral BID PRN Fredrik Cove, PA-C      . doxepin (SINEQUAN) capsule 50 mg  50 mg Oral QHS PRN Mojeed Akintayo   50 mg at 07/15/12 2147  . famotidine (PEPCID) tablet 20 mg  20 mg Oral BID Fredrik Cove, PA-C   20 mg at 07/16/12 5621  . hydrochlorothiazide (HYDRODIURIL) tablet 25 mg  25 mg Oral Daily Bethanie Dicker  Adams, PA-C   25 mg at 07/16/12 0752  . ibuprofen (ADVIL,MOTRIN) tablet 600 mg  600 mg Oral Q6H Fredrik Cove, PA-C   600 mg at 07/16/12 1210  . LORazepam (ATIVAN) tablet 1 mg  1 mg Oral Q8H PRN Shuvon Rankin, NP   1 mg at 07/14/12 1549  . magnesium hydroxide (MILK OF MAGNESIA) suspension 30 mL  30 mL Oral Daily PRN Shuvon Rankin, NP      . Oxcarbazepine  (TRILEPTAL) tablet 300 mg  300 mg Oral BID Mojeed Akintayo   300 mg at 07/16/12 0752  . psyllium (HYDROCIL/METAMUCIL) packet 1 packet  1 packet Oral Daily Fredrik Cove, PA-C      . QUEtiapine (SEROQUEL XR) 24 hr tablet 300 mg  300 mg Oral QPC supper Mojeed Akintayo   300 mg at 07/15/12 1724    Lab Results:  Results for orders placed during the hospital encounter of 07/11/12 (from the past 48 hour(s))  TSH     Status: Normal   Collection Time   07/16/12  7:59 PM      Component Value Range Comment   TSH 0.640  0.350 - 4.500 uIU/mL   CBC WITH DIFFERENTIAL     Status: Abnormal   Collection Time   07/16/12  7:59 PM      Component Value Range Comment   WBC 8.0  4.0 - 10.5 K/uL    RBC 4.71  3.87 - 5.11 MIL/uL    Hemoglobin 13.8  12.0 - 15.0 g/dL    HCT 16.1  09.6 - 04.5 %    MCV 89.0  78.0 - 100.0 fL    MCH 29.3  26.0 - 34.0 pg    MCHC 32.9  30.0 - 36.0 g/dL    RDW 40.9  81.1 - 91.4 %    Platelets 444 (*) 150 - 400 K/uL    Neutrophils Relative 52  43 - 77 %    Neutro Abs 4.2  1.7 - 7.7 K/uL    Lymphocytes Relative 38  12 - 46 %    Lymphs Abs 3.0  0.7 - 4.0 K/uL    Monocytes Relative 8  3 - 12 %    Monocytes Absolute 0.6  0.1 - 1.0 K/uL    Eosinophils Relative 2  0 - 5 %    Eosinophils Absolute 0.2  0.0 - 0.7 K/uL    Basophils Relative 0  0 - 1 %    Basophils Absolute 0.0  0.0 - 0.1 K/uL   COMPREHENSIVE METABOLIC PANEL     Status: Abnormal   Collection Time   07/16/12  7:59 PM      Component Value Range Comment   Sodium 134 (*) 135 - 145 mEq/L    Potassium 3.5  3.5 - 5.1 mEq/L    Chloride 94 (*) 96 - 112 mEq/L    CO2 25  19 - 32 mEq/L    Glucose, Bld 94  70 - 99 mg/dL    BUN 10  6 - 23 mg/dL    Creatinine, Ser 7.82  0.50 - 1.10 mg/dL    Calcium 95.6  8.4 - 10.5 mg/dL    Total Protein 7.2  6.0 - 8.3 g/dL    Albumin 3.9  3.5 - 5.2 g/dL    AST 19  0 - 37 U/L    ALT 21  0 - 35 U/L    Alkaline Phosphatase 107  39 - 117 U/L    Total Bilirubin 0.3  0.3 - 1.2 mg/dL    GFR calc  non Af Amer >90  >90 mL/min    GFR calc Af Amer >90  >90 mL/min     Physical Findings: AIMS: Facial and Oral Movements Muscles of Facial Expression: None, normal Lips and Perioral Area: None, normal Jaw: None, normal Tongue: None, normal,Extremity Movements Upper (arms, wrists, hands, fingers): None, normal Lower (legs, knees, ankles, toes): None, normal, Trunk Movements Neck, shoulders, hips: None, normal, Overall Severity Severity of abnormal movements (highest score from questions above): None, normal Incapacitation due to abnormal movements: None, normal Patient's awareness of abnormal movements (rate only patient's report): No Awareness, Dental Status Current problems with teeth and/or dentures?: No Does patient usually wear dentures?: No  CIWA:  CIWA-Ar Total: 2  COWS:     Treatment Plan Summary: Daily contact with patient to assess and evaluate symptoms and progress in treatment Medication management  Plan:    Supportive approach/coping skills. Will continue current meds  Medical Decision Making Problem Points:  Established problem, worsening (2), New problem, with additional work-up planned (4) and Review of psycho-social stressors (1) Data Points:  Independent review of image, tracing, or specimen (2) Order Aims Assessment (2) Review or order clinical lab tests (1) Review of medication regiment & side effects (2)  I certify that inpatient services furnished can reasonably be expected to improve the patient's condition.   07/18/2012, 12:22 PM

## 2012-07-18 NOTE — Clinical Social Work Psychosocial (Signed)
[facility] Group Notes:  (Clinical Social Work)  07/18/2012  11:15-11:45AM  Summary of Progress/Problems:   The main focus of today's process group was for the patient to identify ways in which they have in the past sabotaged their own recovery and reasons they may have done this/what they received from doing it.  We then worked to identify a specific plan to avoid doing this when discharged from the hospital for this admission.  The patient expressed that she has let the same people in her life hurt her over and over, and that her life is going to be completely different when she leaves the hospital.  She had a baby 18 days ago, and was off her medications for her bipolar disorder during the 9 months of the pregnancy.  She now is experiencing a resurgence of her bipolar symptoms as well as post-partum depression.  She reported that at discharge she is going to take her medications, go to her doctor, get a counselor, work on her physical health issues, and that this will be a whole new life with her and her son, with her cousins helping.  She is 100% motivated to change, and stated "I see my future bright."  Type of Therapy:  Group Therapy - Process  Participation Level:  Active  Participation Quality:  Appropriate, Attentive, Sharing and Supportive  Affect:  Blunted, Depressed and Tearful  Cognitive:  Alert, Appropriate and Oriented  Insight:  Engaged  Engagement in Therapy:  Engaged  Modes of Intervention:  Clarification, Education, Limit-setting, Problem-solving, Socialization, Support and Processing, Exploration, Discussion   Ambrose Mantle, LCSW 07/18/2012, 12:53 PM

## 2012-07-18 NOTE — Progress Notes (Signed)
Psychoeducational Group Note  Date:  07/18/2012 Time:  0945 am  Group Topic/Focus:  Identifying Needs:   The focus of this group is to help patients identify their personal needs that have been historically problematic and identify healthy behaviors to address their needs.  Participation Level:  Active  Participation Quality:  Appropriate, Sharing and Supportive  Affect:  Appropriate  Cognitive:  Alert  Insight:  Engaged and Improving  Engagement in Group:  Engaged and Improving  Additional Comments:    [name] 07/18/2012,11:26 AM

## 2012-07-18 NOTE — Progress Notes (Signed)
D   Pt is pleasant on approach and cooperative  She reports still having some depressed mood and anxiety but said it is getting a little better each day  She declined the ativan that was offered her and said she would like to cope on her own to see if she can  She reports being more hopeful and mood being more stable but still gets overly emotional at times  She interacts well with select peers A  Verbal support given  Medications administered and effectiveness monitored  Q 15 min checks  R   Pt safe at present

## 2012-07-18 NOTE — Progress Notes (Signed)
Patient ID: Connie [name], female   DOB: 06-19-1991, 22 y.o.   MRN: [id number]  D: Pt denies SI/HI/AVH. Pt continues to be demanding, attention seeking and very labile. Pt continues to complain of PPD. Pt very emotional    A: Pt was offered support and encouragement. Pt was given scheduled medications. Pt was encourage to attend groups. Q 15 minute checks were done for safety. Pt given PRN Benadryl-2158, Zyprexa- 2200, Advil at 2301    R:Pt does not  attend groups.  Pt is taking medication. safety maintained on unit.

## 2012-07-19 NOTE — Progress Notes (Signed)
Patient ID: Connie [name], female   DOB: 07/26/1990, 22 y.o.   MRN: [id number] [facility] MD Progress Note  07/19/2012 5:21 PM Connie [name]  MRN:  [id number]  Subjective:   More talkative today.  Thinks her mood is getting better now. Thinks she will be ready to leave soon now. Able to tolerate meds well.  Diagnosis:  Bipolar I disorder most recent episode depressed  ADL's:  Intact  Sleep: Fair  Appetite:  Fair  Suicidal Ideation: "No" denies Homicidal Ideation: "No" denies  AEB (as evidenced by): Per patient's reports.  Psychiatric Specialty Exam: Review of Systems  Constitutional: Negative.  Negative for fever, chills, weight loss, malaise/fatigue and diaphoresis.  HENT: Negative.  Negative for congestion and sore throat.   Eyes: Negative.  Negative for blurred vision, double vision and photophobia.  Respiratory: Negative.  Negative for cough, shortness of breath and wheezing.   Cardiovascular: Negative.  Negative for chest pain, palpitations and PND.  Gastrointestinal: Negative.  Negative for heartburn, nausea, vomiting, abdominal pain, diarrhea and constipation.  Genitourinary: Negative.   Musculoskeletal: Negative.  Negative for myalgias, joint pain and falls.  Skin: Negative.   Neurological: Negative.  Negative for dizziness, tingling, tremors, sensory change, speech change, focal weakness, seizures, loss of consciousness, weakness and headaches.  Endo/Heme/Allergies: Negative.  Negative for polydipsia. Does not bruise/bleed easily.  Psychiatric/Behavioral: Positive for depression (Presenting symptoms of depression. ). Negative for suicidal ideas, hallucinations, memory loss and substance abuse. The patient is not nervous/anxious and does not have insomnia.        Denies feeling and or being depressed, however, Connie [name] is presenting frequent crying spells and reporting increased sadness and self isolation.    Blood pressure 127/86, pulse 116, temperature 97.6  F (36.4 C), temperature source Oral, resp. rate 16, height 4\' 11"  (1.499 m), weight 76.204 kg (168 lb), not currently breastfeeding.Body mass index is 33.93 kg/(m^2).  General Appearance: Fairly Groomed  Patent attorney::  Good  Speech:  Clear and Coherent  Volume:  Normal  Mood: better  Affect:  Flat.  Thought Process:  Goal Directed  Orientation:  Full (Time, Place, and Person)  Thought Content:  Denies hallucinations, delusions and or paranoia.  Suicidal Thoughts:  No  Homicidal Thoughts:  No  Memory:  Immediate;   Good  Judgement:  Fair  Insight:  Fairly present  Psychomotor Activity:  Normal  Concentration:  Fair  Recall:  Fair  Akathisia:  No  Handed:  Right  AIMS (if indicated):     Assets:  Resilience  Sleep:  Number of Hours: 6.25    Current Medications: Current Facility-Administered Medications  Medication Dose Route Frequency Provider Last Rate Last Dose  . acetaminophen (TYLENOL) tablet 650 mg  650 mg Oral Q6H PRN Shuvon Rankin, NP   650 mg at 07/15/12 1600  . alum & mag hydroxide-simeth (MAALOX/MYLANTA) 200-200-20 MG/5ML suspension 30 mL  30 mL Oral Q4H PRN Shuvon Rankin, NP   30 mL at 07/11/12 0253  . calcium carbonate (TUMS - dosed in mg elemental calcium) chewable tablet 400 mg of elemental calcium  2 tablet Oral BID PRN Fredrik Cove, PA-C      . doxepin (SINEQUAN) capsule 50 mg  50 mg Oral QHS PRN Mojeed Akintayo   50 mg at 07/15/12 2147  . famotidine (PEPCID) tablet 20 mg  20 mg Oral BID Fredrik Cove, PA-C   20 mg at 07/16/12 4132  . hydrochlorothiazide (HYDRODIURIL) tablet 25 mg  25 mg Oral  Daily Fredrik Cove, PA-C   25 mg at 07/16/12 4098  . ibuprofen (ADVIL,MOTRIN) tablet 600 mg  600 mg Oral Q6H Fredrik Cove, PA-C   600 mg at 07/16/12 1210  . LORazepam (ATIVAN) tablet 1 mg  1 mg Oral Q8H PRN Shuvon Rankin, NP   1 mg at 07/14/12 1549  . magnesium hydroxide (MILK OF MAGNESIA) suspension 30 mL  30 mL Oral Daily PRN Shuvon Rankin, NP      .  Oxcarbazepine (TRILEPTAL) tablet 300 mg  300 mg Oral BID Mojeed Akintayo   300 mg at 07/16/12 0752  . psyllium (HYDROCIL/METAMUCIL) packet 1 packet  1 packet Oral Daily Fredrik Cove, PA-C      . QUEtiapine (SEROQUEL XR) 24 hr tablet 300 mg  300 mg Oral QPC supper Mojeed Akintayo   300 mg at 07/15/12 1724    Lab Results:  No results found for this or any previous visit (from the past 48 hour(s)).  Physical Findings: AIMS: Facial and Oral Movements Muscles of Facial Expression: None, normal Lips and Perioral Area: None, normal Jaw: None, normal Tongue: None, normal,Extremity Movements Upper (arms, wrists, hands, fingers): None, normal Lower (legs, knees, ankles, toes): None, normal, Trunk Movements Neck, shoulders, hips: None, normal, Overall Severity Severity of abnormal movements (highest score from questions above): None, normal Incapacitation due to abnormal movements: None, normal Patient's awareness of abnormal movements (rate only patient's report): No Awareness, Dental Status Current problems with teeth and/or dentures?: No Does patient usually wear dentures?: No  CIWA:  CIWA-Ar Total: 2  COWS:     Treatment Plan Summary: Daily contact with patient to assess and evaluate symptoms and progress in treatment Medication management  Plan:    Supportive approach/coping skills. Will continue current meds  Medical Decision Making Problem Points:  Established problem, worsening (2), New problem, with additional work-up planned (4) and Review of psycho-social stressors (1) Data Points:  Independent review of image, tracing, or specimen (2) Order Aims Assessment (2) Review or order clinical lab tests (1) Review of medication regiment & side effects (2)  I certify that inpatient services furnished can reasonably be expected to improve the patient's condition.   07/19/2012, 5:21 PM

## 2012-07-19 NOTE — Progress Notes (Signed)
Adult Psychoeducational Group Note  Date:  07/19/2012 Time:  5:22 PM  Group Topic/Focus:  Developing a Wellness Toolbox:   The focus of this group is to help patients develop a "wellness toolbox" with skills and strategies to promote recovery upon discharge.  Participation Level:  Did Not Attend  Participation Quality:    Affect:    Cognitive:    Insight:   Engagement in Group:    Modes of Intervention:    Additional Comments: none  Marquis Lunch, Kaicen Desena 07/19/2012, 5:22 PM

## 2012-07-19 NOTE — Progress Notes (Signed)
D - Patient up and active on the unit today. Actively participated in morning group. Mood is depressed/sad but brightened during spirituality group. Patient openly talks about why she is here and her goals for improvement. Patient rates depression 4 and hopelessness 0 on a scale of 1 - 10. Patient denies SI/HI and auditory/visual hallucinations. Patient has plans to continue her medications and follow-up with her doctor at discharge to continue to improve herself.  A - Patient offered encouragement and support through therapeutic conversation. Encouraged patient to speak with staff about any questions or concerns. Medications given as ordered.  R - Patient safety maintained with Q 15 minute checks. Patient remains safe on the unit.

## 2012-07-19 NOTE — Progress Notes (Signed)
D: Pt reports sudden crying episodes and overwhelming depression.  Questioning why she has not been put back on meds she was taking prior to pregnancy; reports two-year period without relapse at that time.  Eye contact is good with animated facial expression, although Pt begins to cry frequently and then recomposes and continues conversation.  Affect/mood are sad/depressed/anxious.  Speech is logical and coherent; no overt evidence of disorganized thought process or content, but Pt seems somewhat limited cognitively and does well when given ample time to process.  Interaction is assertive but Pt is needy in terms of soothing, reassurance, and education about her options.  She does not seem to grasp that there are many issues that need to be addressed in order for her to reclaim her son.  Pt became disconsolate during evening, was encouraged to take warm bath.  Was much better organized for rest of evening.  Denies SI/HI, AVH, and acute pain.  Contracting for safety on unit.  Stated that roommate "freaks me out, follows me around, I don't like being in the room with her."  A: Pt provided with warmth and support, education on ways to soothe herself.  Encouraged to spend more time on milieu with peers or just among people and to use all staff as resources.  Writer pointed out that being out of her room gives her more choices of where to spend her time and that that might decrease her annoyance with roommate.  PRNs: Ativan 1mg  PO for anxiety @1948 ; doxepin 50mg  PO for sleep @2257 .  All medications administered according to med orders and POC.  Q15 minute safety checks maintained as per unit protocol.  R: Pt stated that tonight "has been my best night so far", but continues to present as overwhelmed, very depressed, and young for her biological age.  Cooperative with unit routine.  Safety maintained. Dion Saucier RN

## 2012-07-19 NOTE — Progress Notes (Signed)
[facility] Group Notes:  (Nursing/MHT/Case Management/Adjunct)  Date:  07/19/2012  Time:  12:11 AM  Type of Therapy:  Psychoeducational Skills  Participation Level:  Active  Participation Quality:  Appropriate  Affect:  Appropriate  Cognitive:  Appropriate  Insight:  Good  Engagement in Group:  Engaged  Modes of Intervention:  Education  Summary of Progress/Problems: The patient described her day as being "long". She feels better today in the sense that she is less depressed than yesterday.  She spoke at length regarding not taking medicine during her pregnancy, but that she feels the need to take the medication at this time. Her goal for tomorrow is to get more sleep and to attend more groups.  Mccall Will S 07/19/2012, 12:11 AM

## 2012-07-19 NOTE — Progress Notes (Signed)
Adult Psychoeducational Group Note  Date:  07/19/2012 Time:  11:05 AM  Group Topic/Focus:  Spirituality:   The focus of this group is to discuss how one's spirituality can aide in recovery.  Participation Level:  Active  Participation Quality:  Appropriate  Affect:  Appropriate  Cognitive:  Alert, Appropriate and Oriented  Insight: Appropriate  Engagement in Group:  Improving  Modes of Intervention:  Discussion  Additional Comments:  Class was started at 10 am. Patient was actively participating and supportive of peers.   Sandria Senter 07/19/2012, 11:05 AM
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
## 2012-07-19 NOTE — Clinical Social Work Psychosocial (Signed)
[facility] Group Notes:  (Clinical Social Work)  07/19/2012   11:15-11:45AM  Summary of Progress/Problems:  The main focus of today's process group was to listen to a variety of genres of music and to identify that different types of music provoke different responses.  The patient then was able to identify personally what was soothing for them, as well as energizing.  Examples were given of how to use this knowledge in sleep habits, with depression, and with other symptoms.  The patient expressed well what types of music made her calm, happy, bored, and those that made her feel nothing.  She verbalized that she feels able to use this knowledge at home to help with her symptoms.  Type of Therapy:  Music Therapy with processing done  Participation Level:  Active  Participation Quality:  Appropriate, Attentive, Sharing and Supportive  Affect:  Appropriate  Cognitive:  Appropriate and Oriented  Insight:  Engaged  Engagement in Therapy:  Engaged  Modes of Intervention:   Socialization, Support and Processing, Exploration, Education, Rapport Building   [employer], LCSW 07/19/2012, 12:15 PM
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
## 2012-07-20 NOTE — [facility] Suicide Risk Assessment (Signed)
Suicide Risk Assessment  Discharge Assessment     Demographic Factors:  female  Mental Status Per Nursing Assessment::   On Admission:  NA  Current Mental Status by Physician: patient denies suicidal ideation, intent or plan  Loss Factors: Financial problems/change in socioeconomic status  Historical Factors: NA  Risk Reduction Factors:   Living with another person, especially a relative and Positive social support  Continued Clinical Symptoms:  Resolving depressive symptoms  Cognitive Features That Contribute To Risk:  Closed-mindedness Polarized thinking    Suicide Risk:  Minimal: No identifiable suicidal ideation.  Patients presenting with no risk factors but with morbid ruminations; may be classified as minimal risk based on the severity of the depressive symptoms  Discharge Diagnoses:   AXIS I:  Bipolar affective disorder  AXIS II:  Borderline Personality Dis. AXIS III:   Past Medical History  Diagnosis Date  . GERD (gastroesophageal reflux disease)   . Headache   . Urinary tract infection   . Kidney stones   . Carpal tunnel syndrome on both sides     with preg   AXIS IV:  other psychosocial or environmental problems and problems related to social environment AXIS V:  61-70 mild symptoms  Plan Of Care/Follow-up recommendations:  Activity:  as tolerated Diet:  healthy Tests:  routine blood work up. Other:  patient to keep her after care appointment.  Is patient on multiple antipsychotic therapies at discharge:  No   Has Patient had three or more failed trials of antipsychotic monotherapy by history:  No  Recommended Plan for Multiple Antipsychotic Therapies: N/A  Thedore Mins, MD 07/20/2012, 8:59 AM

## 2012-07-20 NOTE — Progress Notes (Signed)
Adult Psychoeducational Group Note  Date:  07/20/2012 Time:  11:46 AM  Group Topic/Focus:  Self Care:   The focus of this group is to help patients understand the importance of self-care in order to improve or restore emotional, physical, spiritual, interpersonal, and financial health.  Participation Level:  Active  Participation Quality:  Appropriate, Sharing and Supportive  Affect:  Appropriate  Cognitive:  Appropriate  Insight: Appropriate  Engagement in Group:  Engaged and Supportive  Modes of Intervention:  Support  Additional Comments:  Pt participated in group  Connie [name] M 07/20/2012, 11:46 AM

## 2012-07-20 NOTE — Progress Notes (Signed)
D: Pt's only complaint today was stomach cramps.  Much brighter and more talkative, positive about future, anticipating discharge on 1/20 to friend's house.  Pt open about remaining feelings of depression and anxiety during 1:1 but "I feel ready to leave."  Eye contact good with animated facial expression.  Mood/affect remain overall depressed/anxious/sad but to much lesser degree than on admission.  Speech is logical and coherent; no evidence of disorganized thought process or content and, although judgment remains somewhat impaired, Pt may well be operating near baseline in that area due to some cognitive limitations.  Interactions with staff and peers are assertive and friendly; provided support to peer in distress as well as accepted same from another peer.  Denies SI/HI, AVH, and acute pain other than uterine cramping.  Contracting for safety on unit.  A: Provided active listening, support, and encouragement for Pt's positive attitude.  Discussed some of the difficulties she may face when no longer in therapeutic environment, but it appears she will be returning to an assisted living home owned by friends and feels she will have support there.  PRNs: Tylenol 650mg  PO for cramping (4/10) @2033  with complete relief; doxepin 50mg  PO for insomnia @ 2121 with good effect.  Pt missed standing 2400 dose of Ibuprofen due to being asleep (asked not to be woken up).  All medications administered according to med orders and POC.  Q15 minute safety checks maintained as per unit protocol.  R: Pt prepared for discharge although still presents as very fragile.  Safety maintained. Dion Saucier RN

## 2012-07-20 NOTE — Progress Notes (Signed)
[facility] Adult Case Management Discharge Plan :  Will you be returning to the same living situation after discharge: No. At discharge, do you have transportation home?:Yes,  Friend [name] Do you have the ability to pay for your medications:Yes,  mental health  Release of information consent forms completed and in the chart;  Patient's signature needed at discharge.  Patient to Follow up at: Follow-up Information    Follow up with [facility]. On 07/21/2012. (8:30AM for your hospital follow up appointment)    Contact information:   [address] [place], [place] [address] phone: [phone] fax: [phone]         Patient denies SI/HI:   Yes,  yes    Safety Planning and Suicide Prevention discussed:  Yes,  yes  Connie [name] 07/20/2012, 2:31 PM

## 2012-07-23 NOTE — Progress Notes (Signed)
Patient Discharge Instructions:  After Visit Summary (AVS):   Faxed to:  07/23/12 Psychiatric Admission Assessment Note:   Faxed to:  07/23/12 Suicide Risk Assessment - Discharge Assessment:   Faxed to:  07/23/12 Faxed/Sent to the Next Level Care provider:  07/23/12 Faxed to [facility] @ [phone]   Jerelene Redden, 07/23/2012, 3:39 PM

## 2012-07-25 ENCOUNTER — Encounter (HOSPITAL_COMMUNITY): Payer: Self-pay | Admitting: *Deleted

## 2012-07-25 ENCOUNTER — Emergency Department (HOSPITAL_COMMUNITY)
Admission: EM | Admit: 2012-07-25 | Discharge: 2012-07-27 | Disposition: A | Discharge status: Other Acute Inpatient Hospital | Payer: Medicare Other | Source: Home / Self Care | Attending: Emergency Medicine | Admitting: Emergency Medicine

## 2012-07-25 DIAGNOSIS — F3289 Other specified depressive episodes: Secondary | ICD-10-CM | POA: Insufficient documentation

## 2012-07-25 DIAGNOSIS — O99345 Other mental disorders complicating the puerperium: Secondary | ICD-10-CM | POA: Insufficient documentation

## 2012-07-25 DIAGNOSIS — Z79899 Other long term (current) drug therapy: Secondary | ICD-10-CM | POA: Insufficient documentation

## 2012-07-25 DIAGNOSIS — Z87440 Personal history of urinary (tract) infections: Secondary | ICD-10-CM | POA: Insufficient documentation

## 2012-07-25 DIAGNOSIS — Z87891 Personal history of nicotine dependence: Secondary | ICD-10-CM | POA: Insufficient documentation

## 2012-07-25 DIAGNOSIS — F329 Major depressive disorder, single episode, unspecified: Secondary | ICD-10-CM | POA: Insufficient documentation

## 2012-07-25 DIAGNOSIS — F53 Postpartum depression: Secondary | ICD-10-CM

## 2012-07-25 DIAGNOSIS — Z8739 Personal history of other diseases of the musculoskeletal system and connective tissue: Secondary | ICD-10-CM | POA: Insufficient documentation

## 2012-07-25 DIAGNOSIS — F319 Bipolar disorder, unspecified: Secondary | ICD-10-CM | POA: Insufficient documentation

## 2012-07-25 DIAGNOSIS — Z87442 Personal history of urinary calculi: Secondary | ICD-10-CM | POA: Insufficient documentation

## 2012-07-25 DIAGNOSIS — K219 Gastro-esophageal reflux disease without esophagitis: Secondary | ICD-10-CM | POA: Insufficient documentation

## 2012-07-25 DIAGNOSIS — F32A Depression, unspecified: Secondary | ICD-10-CM

## 2012-07-25 LAB — CBC
HCT: 41 % (ref 36.0–46.0)
Hemoglobin: 13.3 g/dL (ref 12.0–15.0)
MCH: 28.9 pg (ref 26.0–34.0)
MCHC: 32.4 g/dL (ref 30.0–36.0)
MCV: 89.1 fL (ref 78.0–100.0)
Platelets: 374 10*3/uL (ref 150–400)
RBC: 4.6 MIL/uL (ref 3.87–5.11)
RDW: 14.8 % (ref 11.5–15.5)
WBC: 7.6 10*3/uL (ref 4.0–10.5)

## 2012-07-25 LAB — COMPREHENSIVE METABOLIC PANEL
ALT: 17 U/L (ref 0–35)
AST: 21 U/L (ref 0–37)
Albumin: 4.7 g/dL (ref 3.5–5.2)
Alkaline Phosphatase: 82 U/L (ref 39–117)
BUN: 10 mg/dL (ref 6–23)
CO2: 27 mEq/L (ref 19–32)
Calcium: 10.4 mg/dL (ref 8.4–10.5)
Chloride: 98 mEq/L (ref 96–112)
Creatinine, Ser: 0.79 mg/dL (ref 0.50–1.10)
GFR calc Af Amer: >90 mL/min (ref >90)
GFR calc non Af Amer: >90 mL/min (ref >90)
Glucose, Bld: 115 mg/dL — ABNORMAL HIGH (ref 70–99)
Potassium: 3.6 mEq/L (ref 3.5–5.1)
Sodium: 137 mEq/L (ref 135–145)
Total Bilirubin: 0.2 mg/dL — ABNORMAL LOW (ref 0.3–1.2)
Total Protein: 7.8 g/dL (ref 6.0–8.3)

## 2012-07-25 LAB — RAPID URINE DRUG SCREEN, HOSP PERFORMED
Amphetamines: NOT DETECTED
Barbiturates: POSITIVE — AB
Benzodiazepines: NOT DETECTED
Cocaine: NOT DETECTED
Opiates: NOT DETECTED
Tetrahydrocannabinol: NOT DETECTED

## 2012-07-25 LAB — URINALYSIS, ROUTINE W REFLEX MICROSCOPIC
Bilirubin Urine: NEGATIVE
Glucose, UA: NEGATIVE mg/dL
Nitrite: NEGATIVE
Protein, ur: 30 mg/dL — AB
Specific Gravity, Urine: 1.025 (ref 1.005–1.030)
Urobilinogen, UA: 0.2 mg/dL (ref 0.0–1.0)
pH: 7 (ref 5.0–8.0)

## 2012-07-25 LAB — PREGNANCY, URINE: Preg Test, Ur: NEGATIVE

## 2012-07-25 LAB — URINE MICROSCOPIC-ADD ON

## 2012-07-25 LAB — ETHANOL: Alcohol, Ethyl (B): <11 mg/dL (ref 0–11)

## 2012-07-25 MED ORDER — LORAZEPAM 1 MG PO TABS
1.0000 mg | ORAL_TABLET | Freq: Two times a day (BID) | ORAL | Status: DC
  Administered 2012-07-25 – 2012-07-27 (×4): 1 mg via ORAL
  Filled 2012-07-25 (×3): qty 1

## 2012-07-25 MED ORDER — LORAZEPAM 1 MG PO TABS
1.0000 mg | ORAL_TABLET | Freq: Once | ORAL | Status: AC
  Administered 2012-07-25: 1 mg via ORAL
  Filled 2012-07-25: qty 1

## 2012-07-25 MED ORDER — ACETAMINOPHEN 325 MG PO TABS: 650.0000 mg | ORAL_TABLET | ORAL | Status: DC | PRN

## 2012-07-25 MED ORDER — RISPERIDONE 1 MG PO TABS
ORAL_TABLET | ORAL | Status: AC
  Filled 2012-07-25: qty 1

## 2012-07-25 MED ORDER — OXCARBAZEPINE 300 MG PO TABS
ORAL_TABLET | ORAL | Status: AC
  Filled 2012-07-25: qty 1

## 2012-07-25 MED ORDER — OXCARBAZEPINE 300 MG PO TABS
300.0000 mg | ORAL_TABLET | Freq: Two times a day (BID) | ORAL | Status: DC
  Administered 2012-07-25 – 2012-07-27 (×4): 300 mg via ORAL
  Filled 2012-07-25 (×8): qty 1

## 2012-07-25 MED ORDER — RISPERIDONE 1 MG PO TABS
1.0000 mg | ORAL_TABLET | Freq: Every day | ORAL | Status: DC
  Administered 2012-07-25 – 2012-07-26 (×2): 1 mg via ORAL
  Filled 2012-07-25 (×4): qty 1

## 2012-07-25 NOTE — ED Notes (Signed)
Pt reports has history of bipolar.  Reports is 3 weeks postpartum and has postpartum depression.  Pt says can't eat or sleep, has been laughing uncontrollably and crying uncontrollably.  Pt  Denies wanting to hurt herself or others.

## 2012-07-25 NOTE — ED Provider Notes (Signed)
History  This chart was scribed for Gerhard Munch, MD by Ardeen Jourdain, ED Scribe. This patient was seen in room APA16A/APA16A and the patient's care was started at 1819.  CSN: [id number]  Arrival date & time 07/25/12  1750   First MD Initiated Contact with Patient 07/25/12 1819      Chief Complaint  Patient presents with  . V70.1     The history is provided by the patient. No language interpreter was used.    Connie [name] is a 22 y.o. female with a history of bipolar 1 disorder who presents to the Emergency Department complaining of manic symptoms. She states she has been having "episodes" recently. She reports she has not been able to eat or sleep, has been having racing thoughts and has either been laughing or crying uncontrollably. She describes her depression as "not wanting to do anything" and states that "everything hurts." She states she is 3 weeks postpartum and believes she has postpartum depression. She denies any SI or HI currently. She states she was prescribed Seroquel and trileptal but has stopped taking them because "they don't help and make everything worse." She denies any current drug and alcohol use.    Past Medical History  Diagnosis Date  . GERD (gastroesophageal reflux disease)   . Bipolar 1 disorder   . Depression   . Panic attack   . Headache   . Urinary tract infection   . Kidney stones   . Carpal tunnel syndrome on both sides     with preg  . Bipolar disorder     Past Surgical History  Procedure Date  . Tonsillectomy   . Myringotomy     Family History  Problem Relation Age of Onset  . Other Neg Hx   . Hypertension Mother   . Hypertension Father   . Hypertension Maternal Aunt   . Hypertension Maternal Uncle   . Hypertension Paternal Aunt   . Hypertension Paternal Uncle   . Diabetes Maternal Grandfather     History  Substance Use Topics  . Smoking status: Former Smoker -- 0.2 packs/day    Types: Cigarettes    Quit date:  04/19/2012  . Smokeless tobacco: Never Used  . Alcohol Use: No    OB History    Grav Para Term Preterm Abortions TAB SAB Ect Mult Living   1 1 1  0 0 0 0 0 0 1      Review of Systems  Constitutional:       Per HPI, otherwise negative  HENT:       Per HPI, otherwise negative  Eyes: Negative.   Respiratory:       Per HPI, otherwise negative  Cardiovascular:       Per HPI, otherwise negative  Gastrointestinal: Negative for vomiting.  Genitourinary: Negative.   Musculoskeletal:       Per HPI, otherwise negative  Skin: Negative.   Neurological: Negative for syncope.    Allergies  Review of patient's allergies indicates no known allergies.  Home Medications   Current Outpatient Rx  Name  Route  Sig  Dispense  Refill  . CALCIUM CARBONATE ANTACID 500 MG PO CHEW   Oral   Chew 2 tablets by mouth 2 (two) times daily as needed. For heartburn         . FAMOTIDINE 20 MG PO TABS   Oral   Take 1 tablet (20 mg total) by mouth 2 (two) times daily. For reflux.   60 tablet  0   . HYDROCHLOROTHIAZIDE 25 MG PO TABS   Oral   Take 1 tablet (25 mg total) by mouth daily. Edema and elevated blood pressure.   5 tablet   0   . OXCARBAZEPINE 300 MG PO TABS   Oral   Take 1 tablet (300 mg total) by mouth 2 (two) times daily. For mood stabilization.   60 tablet   0   . PSYLLIUM 28 % PO PACK   Oral   Take 1 packet by mouth 2 (two) times daily. For constipation.   30 packet   0   . QUETIAPINE FUMARATE ER 300 MG PO TB24   Oral   Take 1 tablet (300 mg total) by mouth daily after supper. For depression, sleep and agitation.   30 tablet   0     Triage Vitals: BP 139/82  Pulse 97  Temp 97.9 F (36.6 C) (Oral)  Resp 20  Ht 5' (1.524 m)  Wt 170 lb 4.8 oz (77.248 kg)  BMI 33.26 kg/m2  SpO2 100%  Breastfeeding? No  Physical Exam  Nursing note and vitals reviewed. Constitutional: She is oriented to person, place, and time. She appears well-developed and well-nourished. No  distress.       Crying during exam   HENT:  Head: Normocephalic and atraumatic.  Eyes: Conjunctivae normal and EOM are normal.  Neck: Normal range of motion. No tracheal deviation present.  Cardiovascular: Normal rate, regular rhythm and normal heart sounds.  Exam reveals no gallop and no friction rub.   No murmur heard. Pulmonary/Chest: Effort normal and breath sounds normal. No stridor. No respiratory distress. She has no wheezes. She has no rales. She exhibits no tenderness.  Abdominal: Soft. Bowel sounds are normal. She exhibits no distension and no mass. There is no tenderness. There is no rebound and no guarding.  Musculoskeletal: She exhibits no edema.  Neurological: She is alert and oriented to person, place, and time. No cranial nerve deficit.  Skin: Skin is warm and dry.  Psychiatric: She has a normal mood and affect.       Anxious but otherwise normal    ED Course  Procedures (including critical care time)  DIAGNOSTIC STUDIES: Oxygen Saturation is 100% on room air, normal by my interpretation.    COORDINATION OF CARE:  6:20 PM: Discussed treatment plan which includes UA, CBC, CMP, ethanol, drug screen, consult to call ACT with pt at bedside and pt agreed to plan.    6:45 PM: Medication Order: LORazepam (ATIVAN) tablet 1 mg Once     Labs Reviewed - No data to display No results found.   No diagnosis found.  I discussed patient's case, and review her chart, including a hospitalization last year.  Psychiatry was consulted  MDM   I personally performed the services described in this documentation, which was scribed in my presence. The recorded information has been reviewed and is accurate.   This young female with history of bipolar disorder, recent delivery of a female, now in custody of her child protective services presents with behavior consistent with next episode of mania and depression.  Discussed patient's case with her psychiatrist, who consult in, and  evaluated the patient is recommended admission to an inpatient psychiatric unit for further evaluation and management.    Gerhard Munch, MD 07/25/12 2052

## 2012-07-26 MED ORDER — DIPHENHYDRAMINE HCL 25 MG PO CAPS
25.0000 mg | ORAL_CAPSULE | Freq: Four times a day (QID) | ORAL | Status: DC | PRN
  Administered 2012-07-26: 25 mg via ORAL
  Filled 2012-07-26: qty 1

## 2012-07-26 NOTE — ED Notes (Signed)
Requests petroleum jelly for her dry lips, Biotene provided to patient, which she states improved her symptoms of dry lips.

## 2012-07-26 NOTE — Progress Notes (Signed)
Spoke with [facility] Rosey Bath who reported no beds at [facility].  Called [facility] and [facility], no beds.  [facility] does not take adult medicaid.  Unable to get call back from [place].  Informed Dr Effie Shy.

## 2012-07-26 NOTE — ED Notes (Signed)
Pts belongings bagged and placed in ems closet. Pts valuables itemized and taken and secured by security. Pts home meds were bagged and handed to me. I called [facility] and asked what I was supposed to do with these medications, and I was told to give the meds to my charge nurse or wait until the morning and have the meds locked up in pharmacy. I gave meds  to charge nurse.

## 2012-07-26 NOTE — ED Provider Notes (Signed)
Patient is calm and cooperative this morning. She states she is starting to feel a little better.  The patient is waiting inpatient psychiatric treatment.  Celene Kras, MD 07/26/12 [phone]

## 2012-07-26 NOTE — ED Notes (Signed)
Patient states that she wants something for sinus congestion, states that she usually takes Benadryl at home, MD made aware of request.
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
## 2012-07-26 NOTE — Progress Notes (Signed)
Patient accepted to [facility] by Assunta Found, NP pending bed availability. Currently no beds. Rosey Bath, RN

## 2012-07-26 NOTE — ED Notes (Signed)
Per review of notes, pending ACT team assessment.

## 2012-07-27 ENCOUNTER — Inpatient Hospital Stay (HOSPITAL_COMMUNITY)
Admission: AD | Admit: 2012-07-27 | Discharge: 2012-07-28 | DRG: 885 | Disposition: A | Discharge status: Home / Self Care | Payer: Medicare Other | Source: Intra-hospital | Attending: Psychiatry | Admitting: Psychiatry

## 2012-07-27 ENCOUNTER — Encounter (HOSPITAL_COMMUNITY): Payer: Self-pay | Admitting: *Deleted

## 2012-07-27 DIAGNOSIS — F819 Developmental disorder of scholastic skills, unspecified: Secondary | ICD-10-CM

## 2012-07-27 DIAGNOSIS — Z5900 Homelessness unspecified: Secondary | ICD-10-CM

## 2012-07-27 DIAGNOSIS — IMO0001 Reserved for inherently not codable concepts without codable children: Secondary | ICD-10-CM

## 2012-07-27 DIAGNOSIS — F603 Borderline personality disorder: Secondary | ICD-10-CM | POA: Diagnosis present

## 2012-07-27 DIAGNOSIS — I10 Essential (primary) hypertension: Secondary | ICD-10-CM

## 2012-07-27 DIAGNOSIS — F319 Bipolar disorder, unspecified: Secondary | ICD-10-CM | POA: Diagnosis present

## 2012-07-27 DIAGNOSIS — Z79899 Other long term (current) drug therapy: Secondary | ICD-10-CM

## 2012-07-27 DIAGNOSIS — F1910 Other psychoactive substance abuse, uncomplicated: Secondary | ICD-10-CM

## 2012-07-27 LAB — URINE CULTURE: Colony Count: 7000

## 2012-07-27 MED ORDER — HYDROCHLOROTHIAZIDE 25 MG PO TABS
25.0000 mg | ORAL_TABLET | Freq: Every day | ORAL | Status: DC
  Administered 2012-07-28: 25 mg via ORAL
  Filled 2012-07-27 (×3): qty 1

## 2012-07-27 MED ORDER — DIPHENHYDRAMINE HCL 50 MG PO CAPS
50.0000 mg | ORAL_CAPSULE | Freq: Every evening | ORAL | Status: DC | PRN
  Filled 2012-07-27 (×6): qty 1

## 2012-07-27 MED ORDER — ALUM & MAG HYDROXIDE-SIMETH 200-200-20 MG/5ML PO SUSP: 30.0000 mL | ORAL | Status: DC | PRN

## 2012-07-27 MED ORDER — OXCARBAZEPINE 300 MG PO TABS
300.0000 mg | ORAL_TABLET | Freq: Two times a day (BID) | ORAL | Status: DC
  Administered 2012-07-28: 300 mg via ORAL
  Filled 2012-07-27 (×6): qty 1

## 2012-07-27 MED ORDER — HYDROXYZINE HCL 25 MG PO TABS
25.0000 mg | ORAL_TABLET | Freq: Three times a day (TID) | ORAL | Status: DC | PRN
  Administered 2012-07-27: 25 mg via ORAL
  Filled 2012-07-27: qty 1

## 2012-07-27 MED ORDER — LEVOFLOXACIN 500 MG PO TABS
500.0000 mg | ORAL_TABLET | Freq: Every day | ORAL | Status: DC
  Administered 2012-07-28: 500 mg via ORAL
  Filled 2012-07-27 (×3): qty 1

## 2012-07-27 MED ORDER — ACETAMINOPHEN 325 MG PO TABS
650.0000 mg | ORAL_TABLET | Freq: Four times a day (QID) | ORAL | Status: DC | PRN
  Administered 2012-07-28: 650 mg via ORAL

## 2012-07-27 MED ORDER — QUETIAPINE FUMARATE ER 300 MG PO TB24
300.0000 mg | ORAL_TABLET | Freq: Every day | ORAL | Status: DC
  Filled 2012-07-27 (×2): qty 1

## 2012-07-27 MED ORDER — NICOTINE 21 MG/24HR TD PT24
21.0000 mg | MEDICATED_PATCH | Freq: Every day | TRANSDERMAL | Status: DC
  Filled 2012-07-27 (×3): qty 1

## 2012-07-27 MED ORDER — PSYLLIUM 95 % PO PACK
1.0000 | PACK | Freq: Every day | ORAL | Status: DC
  Filled 2012-07-27 (×3): qty 1

## 2012-07-27 MED ORDER — LORAZEPAM 1 MG PO TABS
ORAL_TABLET | ORAL | Status: AC
  Administered 2012-07-27: 1 mg via ORAL
  Filled 2012-07-27: qty 1

## 2012-07-27 MED ORDER — MAGNESIUM HYDROXIDE 400 MG/5ML PO SUSP: 30.0000 mL | Freq: Every day | ORAL | Status: DC | PRN

## 2012-07-27 MED ORDER — FAMOTIDINE 20 MG PO TABS
20.0000 mg | ORAL_TABLET | Freq: Two times a day (BID) | ORAL | Status: DC
  Administered 2012-07-28: 20 mg via ORAL
  Filled 2012-07-27 (×6): qty 1

## 2012-07-27 NOTE — ED Provider Notes (Signed)
Patient reports history of depression however she is 3 months postpartum and her depression got worse. She reports she was just discharged from behavioral health last week after admission for postpartum depression. She states she thought she was doing better but when she got home she realized she wasn't ready to go back home.  10 AM nurse reports patient getting more upset. Patient wanted to go outside to smoke. She started on the NicoDerm patch at the highest dose. She also has are to have her regularly scheduled Ativan.  Patient is ambulatory to the bathroom. She does appear to be tearful.  Tommy, act team is in the ED and will evaluate patient.   Devoria Albe, MD, FACEP   [name], MD 07/27/12 1016

## 2012-07-27 NOTE — BHH Counselor (Addendum)
The patient has been accepted to the Service of Dr  Bo Mcclintock as a voluntary admission. No precert needed for her medicare. Support paper work signed and distributed.  Transportation will be by [facility]. Dr Devoria Albe advised of pending transfer. Patient assigned to room 403-02.

## 2012-07-27 NOTE — Discharge Summary (Signed)
Seen and agreed. Banjamin Stovall, MD 

## 2012-07-27 NOTE — ED Notes (Signed)
Patient tearful, requesting to go outside to smoke.  Informed that she could not, but would speak w/MD regarding any additional medication for anxiety.  Notified Dr. Lynelle Doctor of patient's situation.

## 2012-07-27 NOTE — ED Provider Notes (Signed)
Connie [name], ACT states accepted at [facility] by Dr Jannifer Franklin.  [name], MD 07/27/12 [phone]

## 2012-07-27 NOTE — Tx Team (Signed)
Initial Interdisciplinary Treatment Plan  PATIENT STRENGTHS: (choose at least two) Communication skills   PATIENT STRESSORS: Marital or family conflict Medication change or noncompliance   PROBLEM LIST: Problem List/Patient Goals Date to be addressed Date deferred Reason deferred Estimated date of resolution  Medication and Follow-up non-compliance 07/27/12     Family conflict affecting housing status  07/27/12     Unstable mood-mania 07/27/12                                           DISCHARGE CRITERIA:  Ability to meet basic life and health needs Adequate post-discharge living arrangements Improved stabilization in mood, thinking, and/or behavior Motivation to continue treatment in a less acute level of care Safe-care adequate arrangements made Verbal commitment to aftercare and medication compliance  PRELIMINARY DISCHARGE PLAN: Attend aftercare/continuing care group Participate in family therapy Return to previous living arrangement  PATIENT/FAMIILY INVOLVEMENT: This treatment plan has been presented to and reviewed with the patient, Connie [name], and/or family member,.  The patient and family have been given the opportunity to ask questions and make suggestions.  Cresenciano Lick 07/27/2012, 10:12 PM

## 2012-07-27 NOTE — ED Notes (Signed)
Gave pt lunch tray 
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
## 2012-07-28 ENCOUNTER — Encounter (HOSPITAL_COMMUNITY): Payer: Self-pay | Admitting: Psychiatry

## 2012-07-28 DIAGNOSIS — F319 Bipolar disorder, unspecified: Principal | ICD-10-CM

## 2012-07-28 NOTE — Progress Notes (Signed)
[facility] Adult Case Management Discharge Plan :  Will you be returning to the same living situation after discharge: No. At discharge, do you have transportation home?:Yes,  boyfriend Do you have the ability to pay for your medications:Yes,  MCD  Release of information consent forms completed and in the chart;  Patient's signature needed at discharge.  Patient to Follow up at: Follow-up Information    Follow up with [facility]. On 07/30/2012. (8:00AM for your hospital follow up appointment)    Contact information:   [address] [place]  [phone]         Patient denies SI/HI:   Yes,  yes    Safety Planning and Suicide Prevention discussed:  Yes,  yes  Daryel Gerald B 07/28/2012, 10:27 AM

## 2012-07-28 NOTE — Progress Notes (Signed)
[facility] LCSW Aftercare Discharge Planning Group Note  07/28/2012 10:21 AM  Participation Quality:  Appropriate  Affect:  Irritable  Cognitive:  Oriented  Insight:  Limited  Engagement in Group:  Engaged  Modes of Intervention:  Discussion, Exploration and Socialization  Summary of Progress/Problems: Connie [name] came into group after seeing the Dr.  She was upset with him because "he said there is nothing he ca do for me."  States she went to stay with grandmother post d/c, and "she said my baby would be better off without me and talked down to me."  Also, "Connie [name] lied to me about staying with her."  Undeterred, she plans to stay with baby daddy, with whom she has been in contact and thinks they will be able to work out differences.  Plans to follow up at [facility].  Daryel Gerald B 07/28/2012, 10:21 AM

## 2012-07-28 NOTE — Progress Notes (Signed)
Patient ID: Connie [name], female   DOB: Oct 05, 1990, 22 y.o.   MRN: [id number] PER STATE REGULATIONS 482.30  THIS CHART WAS REVIEWED FOR MEDICAL NECESSITY WITH RESPECT TO THE PATIENT'S ADMISSION/ DURATION OF STAY.  NEXT REVIEW DATE: 07/30/2012  Willa Rough, RN, BSN CASE MANAGER

## 2012-07-28 NOTE — [facility] Suicide Risk Assessment (Signed)
Suicide Risk Assessment  Discharge Assessment     Demographic Factors:  Adolescent or young adult, Low socioeconomic status, Unemployed and female  Mental Status Per Nursing Assessment::   On Admission:  Suicidal ideation indicated by patient  Current Mental Status by Physician: patient denies suicidal ideation, intent or plan  Loss Factors: Financial problems/change in socioeconomic status  Historical Factors: patient denies previous history of suicide attempt  Risk Reduction Factors:   Living with another person, especially a relative and Positive social support  Continued Clinical Symptoms:  Resolving depression  Cognitive Features That Contribute To Risk:  Closed-mindedness    Suicide Risk:  Minimal: No identifiable suicidal ideation.  Patients presenting with no risk factors but with morbid ruminations; may be classified as minimal risk based on the severity of the depressive symptoms  Discharge Diagnoses:   AXIS I:  Bipolar affective disorder  AXIS II:  Borderline Personality Dis. AXIS III:   Past Medical History  Diagnosis Date  . GERD (gastroesophageal reflux disease)   . Headache   . Urinary tract infection   . Kidney stones   . Carpal tunnel syndrome on both sides     with preg   AXIS IV:  housing problems, other psychosocial or environmental problems and problems related to social environment AXIS V:  61-70 mild symptoms  Plan Of Care/Follow-up recommendations:  Activity:  as tolerated Diet:  healthy Tests:  routine  Other:  patient to follow up with her doctor at [facility]  Is patient on multiple antipsychotic therapies at discharge:  No   Has Patient had three or more failed trials of antipsychotic monotherapy by history:  No  Recommended Plan for Multiple Antipsychotic Therapies: N/A  Lessie Manigo,MD 07/28/2012, 11:44 AM

## 2012-07-28 NOTE — Treatment Plan (Signed)
Interdisciplinary Treatment Plan Update (Adult)  Date: 07/28/2012  Time Reviewed: 8:00 AM   Progress in Treatment: Attending groups: Yes Participating in groups: Yes Taking medication as prescribed: Yes Tolerating medication: Yes   Family/Significant other contact made:  No Patient understands diagnosis:  Yes As evidenced by asking for help with getting d/ced for outpt follow up Discussing patient identified problems/goals with staff:  Yes  See below Medical problems stabilized or resolved:  Yes Denies suicidal/homicidal ideation: Yes  In tx team  Connie [name]'s initial assessment at [facility] indicates she was not suicidal when she presented there Issues/concerns per patient self-inventory:  None  Indicates she feels stable for d/c Other:  New problem(s) identified: N/A  Reason for Continuation of Hospitalization: Other; describe None  D/C today  Interventions implemented related to continuation of hospitalization:   Additional comments:  Estimated length of stay: d/c today  Discharge Plan: stay with boyfriend, follow up [facility]  New goal(s): N/A  Review of initial/current patient goals per problem list:   1.  Goal(s): Stabilize mood with the help of medications/therapeutic milieu  Met:  Yes  Target date:1/28 As evidenced ZO:[id number] exhibits no symptoms of signs of mania, and she denies depression today  2.  Goal (s):Identify dispositional, outpt follow up plan  Met:  Yes  Target date:1/28  As evidenced by:see above  3.  Goal(s):  Met:  Yes  Target date:  As evidenced by:  4.  Goal(s):  Met:  Yes  Target date:  As evidenced by:  Attendees: Patient:  Connie [name] 07/28/2012 8:00AM  Family:     Physician:  Thedore Mins 07/28/2012 8:00 AM   Nursing:  Joslyn Devon  07/28/2012 8:00 AM   Clinical Social Worker:  Richelle Ito 07/28/2012 8:00 AM   Extender:  Verne Spurr PA 07/28/2012 8:00 AM   Other:     Other:     Other:     Other:       Scribe for Treatment Team:   Daryel Gerald B, 07/28/2012 8:00 AM

## 2012-07-28 NOTE — Progress Notes (Signed)
Adult Psychosocial Assessment Update Interdisciplinary Team  Previous [facility] admissions/discharges:  Admissions Discharges  Date:current Date:  Date:07/13/12 Date:  Date: Date:  Date: Date:  Date: Date:   Changes since the last Psychosocial Assessment (including adherence to outpatient mental health and/or substance abuse treatment, situational issues contributing to decompensation and/or relapse). States things went poorly for the week she was with grandmother.  Quit taking her medications.  Did not go to follow up appointment.  Became symptomatic with mania, and sought help at the ED.             Discharge Plan 1. Will you be returning to the same living situation after discharge?   Yes: No: X     If no, what is your plan?    Reunite with boyfriend       2. Would you like a referral for services when you are discharged? Yes: X    If yes, for what services?  No:       [facility] laboratory technician and Recommendations (to be completed by the evaluator) Connie [name] is a 22 year old caucasian female who was just here earlier this month for treatment of post-partum depression and bi-polar disorder.  She returned because she apparently did not get into a stable living environment, and it is unclear that her current plan is any more stable.  She insists it is, and plans to follow up at [facility] in [place] for medication management and groups.                       Signature:  Connie [name], 07/28/2012 10:28 AM

## 2012-07-28 NOTE — [facility] Suicide Risk Assessment (Signed)
Suicide Risk Assessment  Admission Assessment     Nursing information obtained from:  Patient Demographic factors:  Adolescent or young adult;Low socioeconomic status Current Mental Status:  Patient denies suicidal ideation. Loss Factors:  Loss of significant relationship;Financial problems / change in socioeconomic status Historical Factors:  Prior suicide attempts Risk Reduction Factors:  Responsible for children under 24 years of age;Positive social support;Positive therapeutic relationship  CLINICAL FACTORS:   Postpartum Depression  COGNITIVE FEATURES THAT CONTRIBUTE TO RISK:  Closed-mindedness    SUICIDE RISK:   Minimal: No identifiable suicidal ideation.  Patients presenting with no risk factors but with morbid ruminations; may be classified as minimal risk based on the severity of the depressive symptoms  PLAN OF CARE:1. Admit for crisis management and stabilization. 2. Medication management to reduce current symptoms to base line and improve the patient's overall level of functioning 3. Treat health problems as indicated. 4. Develop treatment plan to decrease risk of relapse upon discharge and the need for readmission. 5. Psycho-social education regarding relapse prevention and self care. 6. Health care follow up as needed for medical problems. 7. Restart home medications where appropriate.    I certify that inpatient services furnished can reasonably be expected to improve the patient's condition.  Thedore Mins, MD 07/28/2012, 11:22 AM

## 2012-07-28 NOTE — Progress Notes (Addendum)
Patient ID: Connie [name], female   DOB: 28-Sep-1990, 22 y.o.   MRN: [id number] Patient met with MD and nurse; stated that she has been noncompliant with medications and follow up appointments.  Patient stated to MD that she was not ready for discharge when she was here recently and the medications were giving her "side effects."  Patient reports she is not suicidal; she is depressed and having family conflicts with her grandmother who kicked her out.  Patient was confronted about her past discharge.  Patient had became angry and kept stating "you are doing nothing for me" and demanded to be discharged.  Patient stated during meeting that she lied.  The decision was made that patient will be discharged today.  Patient agreed that she will contact someone to pick her up.  She denies SI/HI/AVH.  Patient left ambulatory with father.  She agreed to follow up with [facility] and to stay compliant with her medications.

## 2012-07-28 NOTE — H&P (Signed)
Psychiatric Admission Assessment Adult  Patient Identification:  Connie [name] Date of Evaluation:  07/28/2012  Chief Complaint: " I was not able to get along with my grandmother after you discharged me last week"  History of Present Illness:Patient is a  22 year old woman with history of Bipolar depression who was discharged from [address] on 07/10/2012. Patient returned to the emergency room on 07/25/12 claiming that she was having manic episode. However, when I interviewed patient today she reports that she came back to the hospital because she was unable to get along with her grandmother whom she was living with. She reports that she is not suicidal, homicidal and denies psychosis and delusional symptoms. She states that her main problem is finding a place to live other than her grandmother.    Elements:  Location:  [facility] inpatient.. Quality:  mild depression. Severity:  recurrent due to lack of compliant with after care and medication. Timing:  started last week. Duration:  one week. Context:  argument with grand mother.. Associated Signs/Synptoms: Depression Symptoms:  psychomotor retardation, poor sleep (Hypo) Manic Symptoms:  None reported Anxiety Symptoms:  Excessive Worry, Psychotic Symptoms:  None reported PTSD Symptoms: Negative  Psychiatric Specialty Exam: Physical Exam  Psychiatric: Her speech is normal and behavior is normal. Judgment and thought content normal. Her mood appears anxious. Cognition and memory are normal.    Review of Systems  Constitutional: Negative.   HENT: Negative.   Eyes: Negative.   Respiratory: Negative.   Cardiovascular: Negative.   Gastrointestinal: Negative.   Genitourinary: Negative.   Musculoskeletal: Negative.   Skin: Negative.   Neurological: Negative.   Endo/Heme/Allergies: Negative.   Psychiatric/Behavioral: The patient is nervous/anxious.     Blood pressure 130/92, pulse 109, temperature 97.1 F (36.2 C), temperature source  Oral, resp. rate 16, not currently breastfeeding.There is no height or weight on file to calculate BMI.  General Appearance: Casual and Fairly Groomed  Patent attorney::  Good  Speech:  Clear and Coherent  Volume:  Normal  Mood:  Euthymic  Affect:  Restricted  Thought Process:  Goal Directed and Linear  Orientation:  Full (Time, Place, and Person)  Thought Content:  Negative  Suicidal Thoughts:  No  Homicidal Thoughts:  No  Memory:  Immediate;   Good Recent;   Good Remote;   Good  Judgement:  Other:  marginal.  Insight:  Shallow  Psychomotor Activity:  Normal  Concentration:  Fair  Recall:  Good  Akathisia:  No  Handed:  Right  AIMS (if indicated):     Assets:  Communication Skills Desire for Improvement Physical Health  Sleep:  Number of Hours: 6.75     Past Psychiatric History: Diagnosis:  Hospitalizations:  Outpatient Care:  Substance Abuse Care:  Self-Mutilation:  Suicidal Attempts:  Violent Behaviors:   Past Medical History:   Past Medical History  Diagnosis Date  . GERD (gastroesophageal reflux disease)   . Bipolar 1 disorder   . Depression   . Headache   . Urinary tract infection   . Kidney stones   . Carpal tunnel syndrome on both sides     with preg  . Bipolar disorder     Allergies:  No Known Allergies PTA Medications: Prescriptions prior to admission  Medication Sig Dispense Refill  . calcium carbonate (TUMS - DOSED IN MG ELEMENTAL CALCIUM) 500 MG chewable tablet Chew 2 tablets by mouth 2 (two) times daily as needed. For heartburn      . famotidine (PEPCID)  20 MG tablet Take 1 tablet (20 mg total) by mouth 2 (two) times daily. For reflux.  60 tablet  0  . hydrochlorothiazide (HYDRODIURIL) 25 MG tablet Take 1 tablet (25 mg total) by mouth daily. Edema and elevated blood pressure.  5 tablet  0  . Oxcarbazepine (TRILEPTAL) 300 MG tablet Take 1 tablet (300 mg total) by mouth 2 (two) times daily. For mood stabilization.  60 tablet  0  . psyllium  (METAMUCIL SMOOTH TEXTURE) 28 % packet Take 1 packet by mouth 2 (two) times daily. For constipation.  30 packet  0  . QUEtiapine (SEROQUEL XR) 300 MG 24 hr tablet Take 1 tablet (300 mg total) by mouth daily after supper. For depression, sleep and agitation.  30 tablet  0    Previous Psychotropic Medications:  Medication/Dose                 Substance Abuse History in the last 12 months:  no  Consequences of Substance Abuse: Negative  Social History:  reports that she quit smoking about 3 months ago. Her smoking use included Cigarettes. She smoked .25 packs per day. She has never used smokeless tobacco. She reports that she does not drink alcohol or use illicit drugs. Additional Social History:                      Current Place of Residence:   Place of Birth:   Family Members: Marital Status:  Single Children:  Sons:  Daughters: Relationships: Education:  dropped out of high school Educational Problems/Performance: Religious Beliefs/Practices: History of Abuse (Emotional/Phsycial/Sexual) Teacher, music History:  None. Legal History: Hobbies/Interests:  Family History:   Family History  Problem Relation Age of Onset  . Other Neg Hx   . Hypertension Mother   . Hypertension Father   . Hypertension Maternal Aunt   . Hypertension Maternal Uncle   . Hypertension Paternal Aunt   . Hypertension Paternal Uncle   . Diabetes Maternal Grandfather     Results for orders placed during the hospital encounter of 07/25/12 (from the past 72 hour(s))  CBC     Status: Normal   Collection Time   07/25/12  6:33 PM      Component Value Range Comment   WBC 7.6  4.0 - 10.5 K/uL    RBC 4.60  3.87 - 5.11 MIL/uL    Hemoglobin 13.3  12.0 - 15.0 g/dL    HCT 62.1  30.8 - 65.7 %    MCV 89.1  78.0 - 100.0 fL    MCH 28.9  26.0 - 34.0 pg    MCHC 32.4  30.0 - 36.0 g/dL    RDW 84.6  96.2 - 95.2 %    Platelets 374  150 - 400 K/uL   COMPREHENSIVE METABOLIC  PANEL     Status: Abnormal   Collection Time   07/25/12  6:33 PM      Component Value Range Comment   Sodium 137  135 - 145 mEq/L    Potassium 3.6  3.5 - 5.1 mEq/L    Chloride 98  96 - 112 mEq/L    CO2 27  19 - 32 mEq/L    Glucose, Bld 115 (*) 70 - 99 mg/dL    BUN 10  6 - 23 mg/dL    Creatinine, Ser 8.41  0.50 - 1.10 mg/dL    Calcium 32.4  8.4 - 10.5 mg/dL    Total Protein 7.8  6.0 - 8.3  g/dL    Albumin 4.7  3.5 - 5.2 g/dL    AST 21  0 - 37 U/L    ALT 17  0 - 35 U/L    Alkaline Phosphatase 82  39 - 117 U/L    Total Bilirubin 0.2 (*) 0.3 - 1.2 mg/dL    GFR calc non Af Amer >90  >90 mL/min    GFR calc Af Amer >90  >90 mL/min   ETHANOL     Status: Normal   Collection Time   07/25/12  6:33 PM      Component Value Range Comment   Alcohol, Ethyl (B) <11  0 - 11 mg/dL   URINE RAPID DRUG SCREEN (HOSP PERFORMED)     Status: Abnormal   Collection Time   07/25/12  6:45 PM      Component Value Range Comment   Opiates NONE DETECTED  NONE DETECTED    Cocaine NONE DETECTED  NONE DETECTED    Benzodiazepines NONE DETECTED  NONE DETECTED    Amphetamines NONE DETECTED  NONE DETECTED    Tetrahydrocannabinol NONE DETECTED  NONE DETECTED    Barbiturates POSITIVE (*) NONE DETECTED   URINALYSIS, ROUTINE W REFLEX MICROSCOPIC     Status: Abnormal   Collection Time   07/25/12  6:45 PM      Component Value Range Comment   Color, Urine YELLOW  YELLOW    APPearance HAZY (*) CLEAR    Specific Gravity, Urine 1.025  1.005 - 1.030    pH 7.0  5.0 - 8.0    Glucose, UA NEGATIVE  NEGATIVE mg/dL    Hgb urine dipstick LARGE (*) NEGATIVE    Bilirubin Urine NEGATIVE  NEGATIVE    Ketones, ur TRACE (*) NEGATIVE mg/dL    Protein, ur 30 (*) NEGATIVE mg/dL    Urobilinogen, UA 0.2  0.0 - 1.0 mg/dL    Nitrite NEGATIVE  NEGATIVE    Leukocytes, UA TRACE (*) NEGATIVE   PREGNANCY, URINE     Status: Normal   Collection Time   07/25/12  6:45 PM      Component Value Range Comment   Preg Test, Ur NEGATIVE  NEGATIVE     URINE MICROSCOPIC-ADD ON     Status: Abnormal   Collection Time   07/25/12  6:45 PM      Component Value Range Comment   Squamous Epithelial / LPF MANY (*) RARE    WBC, UA 7-10  <3 WBC/hpf    RBC / HPF 11-20  <3 RBC/hpf    Bacteria, UA MANY (*) RARE   URINE CULTURE     Status: Normal   Collection Time   07/25/12  6:45 PM      Component Value Range Comment   Specimen Description URINE, CLEAN CATCH      Special Requests NONE      Culture  Setup Time 07/26/2012 23:02      Colony Count 7,000 COLONIES/ML      Culture INSIGNIFICANT GROWTH      Report Status 07/27/2012 FINAL      Psychological Evaluations:  Assessment:   AXIS I:  Bipolar affective disorder  AXIS II:  Borderline Personality Dis. AXIS III:   Past Medical History  Diagnosis Date  . GERD (gastroesophageal reflux disease)   . Bipolar 1 disorder   . Depression   . Headache   . Urinary tract infection   . Kidney stones   . Carpal tunnel syndrome on both sides  with preg  . Bipolar disorder    AXIS IV:  other psychosocial or environmental problems and problems related to social environment AXIS V:  61-70 mild symptoms  Treatment Plan/Recommendations: 1. Admit for crisis management and stabilization. 2. Medication management to reduce current symptoms to base line and improve the patient's overall level of functioning 3. Treat health problems as indicated. 4. Develop treatment plan to decrease risk of relapse upon discharge and the need for readmission. 5. Psycho-social education regarding relapse prevention and self care. 6. Health care follow up as needed for medical problems. 7. Restart home medications where appropriate.    Treatment Plan Summary: Daily contact with patient to assess and evaluate symptoms and progress in treatment Medication management Current Medications:  Current Facility-Administered Medications  Medication Dose Route Frequency Provider Last Rate Last Dose  . acetaminophen (TYLENOL)  tablet 650 mg  650 mg Oral Q6H PRN Kerry Hough, PA   650 mg at 07/28/12 0803  . alum & mag hydroxide-simeth (MAALOX/MYLANTA) 200-200-20 MG/5ML suspension 30 mL  30 mL Oral Q4H PRN Kerry Hough, PA      . diphenhydrAMINE (BENADRYL) capsule 50 mg  50 mg Oral QHS,MR X 1 Kerry Hough, PA      . famotidine (PEPCID) tablet 20 mg  20 mg Oral BID Kerry Hough, PA   20 mg at 07/28/12 0805  . hydrochlorothiazide (HYDRODIURIL) tablet 25 mg  25 mg Oral Daily Kerry Hough, PA   25 mg at 07/28/12 0805  . levofloxacin (LEVAQUIN) tablet 500 mg  500 mg Oral Daily Kerry Hough, PA   500 mg at 07/28/12 0805  . magnesium hydroxide (MILK OF MAGNESIA) suspension 30 mL  30 mL Oral Daily PRN Kerry Hough, PA      . nicotine (NICODERM CQ - dosed in mg/24 hours) patch 21 mg  21 mg Transdermal Q0600 Kerry Hough, PA      . Oxcarbazepine (TRILEPTAL) tablet 300 mg  300 mg Oral BID Kerry Hough, PA   300 mg at 07/28/12 0805  . psyllium (HYDROCIL/METAMUCIL) packet 1 packet  1 packet Oral Daily Kerry Hough, PA      . QUEtiapine (SEROQUEL XR) 24 hr tablet 300 mg  300 mg Oral QPC supper Kerry Hough, PA        Observation Level/Precautions:  routine.  Laboratory:  routine.  Psychotherapy:    Medications:    Consultations:    Discharge Concerns:    Estimated LOS:  Other:     I certify that inpatient services furnished can reasonably be expected to improve the patient's condition.   Thedore Mins, MD 1/28/201411:26 AM

## 2012-07-28 NOTE — Discharge Summary (Signed)
Physician Discharge Summary Note  Patient:  Connie [name] is an 22 y.o., female MRN:  [id number] DOB:  11-27-1990 Patient phone:  [phone] (home)  Patient address:   [address] [place] [address],   Date of Admission:  07/27/2012 Date of Discharge: 07/28/2012  Reason for Admission:  Did not meet criteria for in patient admission.  Discharge Diagnoses: Principal Problem:  *Bipolar affective disorder Discharge Diagnoses:  AXIS I: Bipolar affective disorder  AXIS II: Borderline Personality Dis.  AXIS III:  Past Medical History   Diagnosis  Date   .  GERD (gastroesophageal reflux disease)    .  Headache    .  Urinary tract infection    .  Kidney stones    .  Carpal tunnel syndrome on both sides      with preg   AXIS IV: housing problems, other psychosocial or environmental problems and problems related to social environment  AXIS V: 61-70 mild symptoms  Review of Systems  Constitutional: Negative.  Negative for fever, chills, weight loss, malaise/fatigue and diaphoresis.  HENT: Negative for congestion and sore throat.   Eyes: Negative for blurred vision, double vision and photophobia.  Respiratory: Negative for cough, shortness of breath and wheezing.   Cardiovascular: Negative for chest pain, palpitations and PND.  Gastrointestinal: Negative for heartburn, nausea, vomiting, abdominal pain, diarrhea and constipation.  Musculoskeletal: Negative for myalgias, joint pain and falls.  Neurological: Negative for dizziness, tingling, tremors, sensory change, speech change, focal weakness, seizures, loss of consciousness, weakness and headaches.  Endo/Heme/Allergies: Negative for polydipsia. Does not bruise/bleed easily.  Psychiatric/Behavioral: Negative for depression, suicidal ideas, hallucinations, memory loss and substance abuse. The patient is not nervous/anxious and does not have insomnia.    Level of Care:  OP  Hospital Course:  Patient was accepted from the ED  for admission to the [facility] unit and was given medical clearance. Upon review of symptoms during her admission to [facility] it is found that the patient is not a reliable historian, had lied about her symptoms, and therefore did not meet criteria for in patient hospitalization.  She was discharged out.  Consults:  None  Significant Diagnostic Studies:  None  Discharge Vitals:   Blood pressure 130/92, pulse 109, temperature 97.1 F (36.2 C), temperature source Oral, resp. rate 16, not currently breastfeeding. There is no height or weight on file to calculate BMI. Lab Results:   Results for orders placed during the hospital encounter of 07/25/12 (from the past 72 hour(s))  CBC     Status: Normal   Collection Time   07/25/12  6:33 PM      Component Value Range Comment   WBC 7.6  4.0 - 10.5 K/uL    RBC 4.60  3.87 - 5.11 MIL/uL    Hemoglobin 13.3  12.0 - 15.0 g/dL    HCT 46.9  62.9 - 52.8 %    MCV 89.1  78.0 - 100.0 fL    MCH 28.9  26.0 - 34.0 pg    MCHC 32.4  30.0 - 36.0 g/dL    RDW 41.3  24.4 - 01.0 %    Platelets 374  150 - 400 K/uL   COMPREHENSIVE METABOLIC PANEL     Status: Abnormal   Collection Time   07/25/12  6:33 PM      Component Value Range Comment   Sodium 137  135 - 145 mEq/L    Potassium 3.6  3.5 - 5.1 mEq/L    Chloride 98  96 - 112 mEq/L    CO2 27  19 - 32 mEq/L    Glucose, Bld 115 (*) 70 - 99 mg/dL    BUN 10  6 - 23 mg/dL    Creatinine, Ser 4.09  0.50 - 1.10 mg/dL    Calcium 81.1  8.4 - 10.5 mg/dL    Total Protein 7.8  6.0 - 8.3 g/dL    Albumin 4.7  3.5 - 5.2 g/dL    AST 21  0 - 37 U/L    ALT 17  0 - 35 U/L    Alkaline Phosphatase 82  39 - 117 U/L    Total Bilirubin 0.2 (*) 0.3 - 1.2 mg/dL    GFR calc non Af Amer >90  >90 mL/min    GFR calc Af Amer >90  >90 mL/min   ETHANOL     Status: Normal   Collection Time   07/25/12  6:33 PM      Component Value Range Comment   Alcohol, Ethyl (B) <11  0 - 11 mg/dL   URINE RAPID DRUG SCREEN (HOSP PERFORMED)     Status: Abnormal    Collection Time   07/25/12  6:45 PM      Component Value Range Comment   Opiates NONE DETECTED  NONE DETECTED    Cocaine NONE DETECTED  NONE DETECTED    Benzodiazepines NONE DETECTED  NONE DETECTED    Amphetamines NONE DETECTED  NONE DETECTED    Tetrahydrocannabinol NONE DETECTED  NONE DETECTED    Barbiturates POSITIVE (*) NONE DETECTED   URINALYSIS, ROUTINE W REFLEX MICROSCOPIC     Status: Abnormal   Collection Time   07/25/12  6:45 PM      Component Value Range Comment   Color, Urine YELLOW  YELLOW    APPearance HAZY (*) CLEAR    Specific Gravity, Urine 1.025  1.005 - 1.030    pH 7.0  5.0 - 8.0    Glucose, UA NEGATIVE  NEGATIVE mg/dL    Hgb urine dipstick LARGE (*) NEGATIVE    Bilirubin Urine NEGATIVE  NEGATIVE    Ketones, ur TRACE (*) NEGATIVE mg/dL    Protein, ur 30 (*) NEGATIVE mg/dL    Urobilinogen, UA 0.2  0.0 - 1.0 mg/dL    Nitrite NEGATIVE  NEGATIVE    Leukocytes, UA TRACE (*) NEGATIVE   PREGNANCY, URINE     Status: Normal   Collection Time   07/25/12  6:45 PM      Component Value Range Comment   Preg Test, Ur NEGATIVE  NEGATIVE   URINE MICROSCOPIC-ADD ON     Status: Abnormal   Collection Time   07/25/12  6:45 PM      Component Value Range Comment   Squamous Epithelial / LPF MANY (*) RARE    WBC, UA 7-10  <3 WBC/hpf    RBC / HPF 11-20  <3 RBC/hpf    Bacteria, UA MANY (*) RARE   URINE CULTURE     Status: Normal   Collection Time   07/25/12  6:45 PM      Component Value Range Comment   Specimen Description URINE, CLEAN CATCH      Special Requests NONE      Culture  Setup Time 07/26/2012 23:02      Colony Count 7,000 COLONIES/ML      Culture INSIGNIFICANT GROWTH      Report Status 07/27/2012 FINAL       Physical Findings: AIMS: Facial and Oral Movements  Muscles of Facial Expression: None, normal Lips and Perioral Area: None, normal Jaw: None, normal Tongue: None, normal,Extremity Movements Upper (arms, wrists, hands, fingers): None, normal Lower (legs, knees,  ankles, toes): None, normal, Trunk Movements Neck, shoulders, hips: None, normal, Overall Severity Severity of abnormal movements (highest score from questions above): None, normal Incapacitation due to abnormal movements: None, normal Patient's awareness of abnormal movements (rate only patient's report): No Awareness, Dental Status Current problems with teeth and/or dentures?: No Does patient usually wear dentures?: No  CIWA:    COWS:     Psychiatric Specialty Exam: See Psychiatric Specialty Exam and Suicide Risk Assessment completed by Attending Physician prior to discharge.  Discharge destination:  Home  Is patient on multiple antipsychotic therapies at discharge:  No   Has Patient had three or more failed trials of antipsychotic monotherapy by history:  No  Recommended Plan for Multiple Antipsychotic Therapies: None   Discharge Orders    Future Orders Please Complete By Expires   Diet - low sodium heart healthy      Increase activity slowly      Discharge instructions      Comments:   Take all your medications as prescribed by your mental healthcare provider. Report any adverse effects and or reactions from your medicines to your outpatient provider promptly. Patient is instructed and cautioned to not engage in alcohol and or illegal drug use while on prescription medicines. In the event of worsening symptoms, patient is instructed to call the crisis hotline, 911 and or go to the nearest ED for appropriate evaluation and treatment of symptoms. Follow-up with your primary care provider for your other medical issues, concerns and or health care needs.       Medication List     As of 07/28/2012 11:53 AM    STOP taking these medications         calcium carbonate 500 MG chewable tablet   Commonly known as: TUMS - dosed in mg elemental calcium      TAKE these medications      Indication    famotidine 20 MG tablet   Commonly known as: PEPCID   Take 1 tablet (20 mg total)  by mouth 2 (two) times daily. For reflux.    Indication: Gastroesophageal Reflux Disease      hydrochlorothiazide 25 MG tablet   Commonly known as: HYDRODIURIL   Take 1 tablet (25 mg total) by mouth daily. Edema and elevated blood pressure.    Indication: Edema      Oxcarbazepine 300 MG tablet   Commonly known as: TRILEPTAL   Take 1 tablet (300 mg total) by mouth 2 (two) times daily. For mood stabilization.    Indication: Manic-Depression      psyllium 28 % packet   Commonly known as: METAMUCIL SMOOTH TEXTURE   Take 1 packet by mouth 2 (two) times daily. For constipation.       QUEtiapine 300 MG 24 hr tablet   Commonly known as: SEROQUEL XR   Take 1 tablet (300 mg total) by mouth daily after supper. For depression, sleep and agitation.    Indication: Manic-Depression           Follow-up Information    Follow up with [facility]. On 07/30/2012. 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  She is often oppositional and refuses treatment. She will do better in a facility that can work with her behavioral problems. Total Discharge Time:  >30 minutes  Signed: Lloyd Huger T. Quinette Hentges RPAC 07/28/2012, 11:53 AM

## 2012-07-30 NOTE — Progress Notes (Signed)
Patient Discharge Instructions:  After Visit Summary (AVS):   Faxed to:  07/30/12 Psychiatric Admission Assessment Note:   Faxed to:  07/30/12 Suicide Risk Assessment - Discharge Assessment:   Faxed to:  07/30/12 Faxed/Sent to the Next Level Care provider:  07/30/12 Faxed to [facility] @ [phone]  Jerelene Redden, 07/30/2012, 4:15 PM

## 2012-07-31 NOTE — Discharge Summary (Deleted)
Physician Discharge Summary Note  Patient:  Connie [name] is an 22 y.o., female MRN:  [id number] DOB:  21-May-1991 Patient phone:  [phone] (home)  Patient address:   [address] [place] [address],   Date of Admission:  07/11/2012 Date of Discharge: 07/20/2012  Reason for Admission: "post partum depression." Discharge Diagnoses: Principal Problem:  *Bipolar affective disorder Active Problems:  Hypertension  Learning disorder  Homeless Discharge Diagnoses:  AXIS I: Bipolar affective disorder  AXIS II: Borderline Personality Dis.  AXIS III:  Past Medical History   Diagnosis  Date   .  GERD (gastroesophageal reflux disease)    .  Headache    .  Urinary tract infection    .  Kidney stones    .  Carpal tunnel syndrome on both sides      with preg   AXIS IV: other psychosocial or environmental problems and problems related to social environment  AXIS V: 61-70 mild symptoms Review of Systems  Constitutional: Negative.  Negative for fever, chills, weight loss, malaise/fatigue and diaphoresis.  HENT: Negative for congestion and sore throat.   Eyes: Negative for blurred vision, double vision and photophobia.  Respiratory: Negative for cough, shortness of breath and wheezing.   Cardiovascular: Negative for chest pain, palpitations and PND.  Gastrointestinal: Negative for heartburn, nausea, vomiting, abdominal pain, diarrhea and constipation.  Musculoskeletal: Negative for myalgias, joint pain and falls.  Neurological: Negative for dizziness, tingling, tremors, sensory change, speech change, focal weakness, seizures, loss of consciousness, weakness and headaches.  Endo/Heme/Allergies: Negative for polydipsia. Does not bruise/bleed easily.  Psychiatric/Behavioral: Negative for depression, suicidal ideas, hallucinations, memory loss and substance abuse. The patient is not nervous/anxious and does not have insomnia.    Level of Care:  OP  Hospital Course:  Connie [name] was  admitted after she called EMS to her grandmother's house stating she was suicidal and hearing voices after she left the hospital from giving birth on the Dec. 27th,  The baby was not discharged with Connie [name] and was in the custody of a relative due to her behaviors shortly after delivery through CPS.       EMS took her to [facility] where she was evaluated by the MD and assessment team and felt to be in need of psychiatric stabilization.  She was given medical clearance and transferred to [facility] for further stabilization and treatment.  Connie [name] was guarded and unwilling to divulge her symptoms or her history for the first few days on the unit. She did however acknowledge that she was never suicidal and had lied about hearing voices in order to get into the hospital.       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 She could not say why she never finished school other than she dropped out. Connie [name] did say that she wasn't in special classes but was supposed to have been due to "learning disorder."       Through out the course of her admission she was dramatic and oppositional and demanding.  Connie [name] was evaluated daily by clinical providers and her response to treatment was assessed.  She was often manipulative and dishonest with providers. Once she had secured a place to stay with friends, she began requesting discharge.  While Connie [name] never actually met the criteria for in patient admission it was clear she would benefit from medication management and stabilization.      The clinical provider pointed out to Connie [name] that she  would be better off with a few more days of treatment, but she was adamant to be discharged.  Connie [name] was discharged out with samples, and prescriptions and a follow up appointment with the local LME to continue her medications.  Consults:  none  Significant Diagnostic Studies:  labs: see all labs pertinent to this visit via EMR  Discharge Vitals:    Blood pressure 130/88, pulse 111, temperature 97.4 F (36.3 C), temperature source Oral, resp. rate 16, height 4\' 11"  (1.499 m), weight 76.204 kg (168 lb), not currently breastfeeding. Body mass index is 33.93 kg/(m^2). Lab Results:   No results found for this or any previous visit (from the past 72 hour(s)).  Physical Findings: AIMS: Facial and Oral Movements Muscles of Facial Expression: None, normal Lips and Perioral Area: None, normal Jaw: None, normal Tongue: None, normal,Extremity Movements Upper (arms, wrists, hands, fingers): None, normal Lower (legs, knees, ankles, toes): None, normal, Trunk Movements Neck, shoulders, hips: None, normal, Overall Severity Severity of abnormal movements (highest score from questions above): None, normal Incapacitation due to abnormal movements: None, normal Patient's awareness of abnormal movements (rate only patient's report): No Awareness, Dental Status Current problems with teeth and/or dentures?: No Does patient usually wear dentures?: No  CIWA:  CIWA-Ar Total: 2  COWS:     Psychiatric Specialty Exam: See Psychiatric Specialty Exam and Suicide Risk Assessment completed by Attending Physician prior to discharge.  Discharge destination:  Home  Is patient on multiple antipsychotic therapies at discharge:  No   Has Patient had three or more failed trials of antipsychotic monotherapy by history:  No  Recommended Plan for Multiple Antipsychotic Therapies: not applicable  Discharge Orders    Future Orders Please Complete By Expires   Diet - low sodium heart healthy      Increase activity slowly      Discharge instructions      Comments:   Take all your medications as prescribed by your mental healthcare provider. Report any adverse effects and or reactions from your medicines to your outpatient provider promptly. Patient is instructed and cautioned to not engage in alcohol and or illegal drug use while on prescription medicines. In the  event of worsening symptoms, patient is instructed to call the crisis hotline, 911 and or go to the nearest ED for appropriate evaluation and treatment of symptoms. Follow-up with your primary care provider for your other medical issues, concerns and or health care needs.       Medication List     As of 07/31/2012 11:02 AM    STOP taking these medications         calcium carbonate 500 MG chewable tablet   Commonly known as: TUMS - dosed in mg elemental calcium      cyclobenzaprine 10 MG tablet   Commonly known as: FLEXERIL      ibuprofen 600 MG tablet   Commonly known as: ADVIL,MOTRIN      lamoTRIgine 25 MG tablet   Commonly known as: LAMICTAL      promethazine 25 MG tablet   Commonly known as: PHENERGAN      risperiDONE 1 MG tablet   Commonly known as: RISPERDAL      TAKE these medications      Indication    famotidine 20 MG tablet   Commonly known as: PEPCID   Take 1 tablet (20 mg total) by mouth 2 (two) times daily. For reflux.    Indication: Gastroesophageal Reflux Disease      hydrochlorothiazide  25 MG tablet   Commonly known as: HYDRODIURIL   Take 1 tablet (25 mg total) by mouth daily. Edema and elevated blood pressure.    Indication: Edema      Oxcarbazepine 300 MG tablet   Commonly known as: TRILEPTAL   Take 1 tablet (300 mg total) by mouth 2 (two) times daily. For mood stabilization.    Indication: Manic-Depression      psyllium 28 % packet   Commonly known as: METAMUCIL SMOOTH TEXTURE   Take 1 packet by mouth 2 (two) times daily. For constipation.       QUEtiapine 300 MG 24 hr tablet   Commonly known as: SEROQUEL XR   Take 1 tablet (300 mg total) by mouth daily after supper. For depression, sleep and agitation.    Indication: Manic-Depression           Follow-up Information    Follow up with [facility]. On 07/21/2012. (8:30AM for your hospital follow up appointment)    Contact information:   [address] [place], [place] [address] phone:  [phone] fax: [phone]         Follow-up recommendations:  As noted above  Comments:  Total Discharge Time:  >30 minutes.  Signed:  Rona Ravens. Keiron Iodice PAC 07/31/2012, 11:02 AM

## 2012-08-04 NOTE — Discharge Summary (Signed)
Seen and agreed. Zelta Enfield, MD 

## 2012-08-10 NOTE — Discharge Summary (Signed)
Seen and agreed. Lyndell Allaire, MD 

## 2012-08-12 NOTE — H&P (Addendum)
Seen and agreed. Mojeed Akintayo, MD 

## 2012-08-14 NOTE — H&P (Signed)
Seen and agreed. Flavio Lindroth, MD 

## 2012-12-20 ENCOUNTER — Emergency Department (HOSPITAL_COMMUNITY)
Admission: EM | Admit: 2012-12-20 | Discharge: 2012-12-21 | Disposition: A | Discharge status: Home / Self Care | Payer: Medicare Other | Attending: Emergency Medicine | Admitting: Emergency Medicine

## 2012-12-20 ENCOUNTER — Encounter (HOSPITAL_COMMUNITY): Payer: Self-pay | Admitting: *Deleted

## 2012-12-20 DIAGNOSIS — Z79899 Other long term (current) drug therapy: Secondary | ICD-10-CM | POA: Insufficient documentation

## 2012-12-20 DIAGNOSIS — Z87440 Personal history of urinary (tract) infections: Secondary | ICD-10-CM | POA: Insufficient documentation

## 2012-12-20 DIAGNOSIS — N390 Urinary tract infection, site not specified: Secondary | ICD-10-CM | POA: Insufficient documentation

## 2012-12-20 DIAGNOSIS — R197 Diarrhea, unspecified: Secondary | ICD-10-CM | POA: Insufficient documentation

## 2012-12-20 DIAGNOSIS — R1011 Right upper quadrant pain: Secondary | ICD-10-CM

## 2012-12-20 DIAGNOSIS — R51 Headache: Secondary | ICD-10-CM | POA: Insufficient documentation

## 2012-12-20 DIAGNOSIS — R110 Nausea: Secondary | ICD-10-CM | POA: Insufficient documentation

## 2012-12-20 DIAGNOSIS — Z8719 Personal history of other diseases of the digestive system: Secondary | ICD-10-CM | POA: Insufficient documentation

## 2012-12-20 DIAGNOSIS — Z87891 Personal history of nicotine dependence: Secondary | ICD-10-CM | POA: Insufficient documentation

## 2012-12-20 DIAGNOSIS — Z8659 Personal history of other mental and behavioral disorders: Secondary | ICD-10-CM | POA: Insufficient documentation

## 2012-12-20 DIAGNOSIS — H53149 Visual discomfort, unspecified: Secondary | ICD-10-CM | POA: Insufficient documentation

## 2012-12-20 DIAGNOSIS — Z87442 Personal history of urinary calculi: Secondary | ICD-10-CM | POA: Insufficient documentation

## 2012-12-20 DIAGNOSIS — Z3202 Encounter for pregnancy test, result negative: Secondary | ICD-10-CM | POA: Insufficient documentation

## 2012-12-20 DIAGNOSIS — R519 Headache, unspecified: Secondary | ICD-10-CM

## 2012-12-20 LAB — URINALYSIS, ROUTINE W REFLEX MICROSCOPIC
Bilirubin Urine: NEGATIVE
Glucose, UA: NEGATIVE mg/dL
Ketones, ur: NEGATIVE mg/dL
Nitrite: NEGATIVE
Protein, ur: NEGATIVE mg/dL
Specific Gravity, Urine: 1.01 (ref 1.005–1.030)
Urobilinogen, UA: 0.2 mg/dL (ref 0.0–1.0)
pH: 6.5 (ref 5.0–8.0)

## 2012-12-20 LAB — CBC WITH DIFFERENTIAL/PLATELET
Basophils Absolute: 0 10*3/uL (ref 0.0–0.1)
Basophils Relative: 0 % (ref 0–1)
Eosinophils Absolute: 0.2 10*3/uL (ref 0.0–0.7)
Eosinophils Relative: 2 % (ref 0–5)
HCT: 37.9 % (ref 36.0–46.0)
Hemoglobin: 13 g/dL (ref 12.0–15.0)
Lymphocytes Relative: 40 % (ref 12–46)
Lymphs Abs: 3.2 10*3/uL (ref 0.7–4.0)
MCH: 30 pg (ref 26.0–34.0)
MCHC: 34.3 g/dL (ref 30.0–36.0)
MCV: 87.3 fL (ref 78.0–100.0)
Monocytes Absolute: 0.6 10*3/uL (ref 0.1–1.0)
Monocytes Relative: 7 % (ref 3–12)
Neutro Abs: 4.2 10*3/uL (ref 1.7–7.7)
Neutrophils Relative %: 51 % (ref 43–77)
Platelets: 328 10*3/uL (ref 150–400)
RBC: 4.34 MIL/uL (ref 3.87–5.11)
RDW: 13.3 % (ref 11.5–15.5)
WBC: 8.2 10*3/uL (ref 4.0–10.5)

## 2012-12-20 LAB — URINE MICROSCOPIC-ADD ON

## 2012-12-20 LAB — PREGNANCY, URINE: Preg Test, Ur: NEGATIVE

## 2012-12-20 MED ORDER — ONDANSETRON HCL 4 MG/2ML IJ SOLN
4.0000 mg | Freq: Once | INTRAMUSCULAR | Status: AC
  Administered 2012-12-21: 4 mg via INTRAVENOUS
  Filled 2012-12-20: qty 2

## 2012-12-20 MED ORDER — HYDROCODONE-ACETAMINOPHEN 5-325 MG PO TABS
1.0000 | ORAL_TABLET | Freq: Once | ORAL | Status: AC
  Administered 2012-12-20: 1 via ORAL
  Filled 2012-12-20: qty 1

## 2012-12-20 MED ORDER — SODIUM CHLORIDE 0.9 % IV SOLN
Freq: Once | INTRAVENOUS | Status: AC
  Administered 2012-12-21: via INTRAVENOUS

## 2012-12-20 MED ORDER — PANTOPRAZOLE SODIUM 40 MG IV SOLR
40.0000 mg | Freq: Once | INTRAVENOUS | Status: AC
  Administered 2012-12-21: 40 mg via INTRAVENOUS
  Filled 2012-12-20: qty 40

## 2012-12-20 NOTE — ED Notes (Signed)
Pt c/o abdominal pain x 2 months ans is supposed to be seeing someone about her gallbladder. Today also c/o diarrhea and headache.

## 2012-12-20 NOTE — ED Provider Notes (Signed)
History     CSN: [id number]  Arrival date & time 12/20/12  2205   First MD Initiated Contact with Patient 12/20/12 2306      Chief Complaint  Patient presents with  . Abdominal Pain  . Headache    (Consider location/radiation/quality/duration/timing/severity/associated sxs/prior treatment) HPI Comments: Connie [name] is a 22 y.o. female who presents to the Emergency Department complaining of chronic right upper abdominal pain that's been worsening for several days.  She states she was evaluated for this by her doctor in [place] , [place] and was referred to see a gastroenterologist at [facility] on June 30 to have an upper GI and "some tests to check my gallbladder", but states she pain is so bad that she couldn't wait until then.  She reports that pain is worse with eating solids and she also has watery diarrhea multiple times a day.  She also c/o right sided headache that began yesterday and is worse with sounds and bright light.  States headache began gradually and has became worse throughout the day.  Headache is similar to previous.  She denies back pain, pelvic pain, vomiting , fever, melena, or recent antibiotics.     Patient is a 22 y.o. female presenting with abdominal pain and headaches.  Abdominal Pain Associated symptoms include abdominal pain, headaches and nausea. Pertinent negatives include no chest pain, chills, fever, neck pain, numbness, rash, vomiting or weakness.  Headache Associated symptoms: abdominal pain, diarrhea, nausea and photophobia   Associated symptoms: no back pain, no dizziness, no fever, no neck pain, no neck stiffness, no numbness and no vomiting     Past Medical History  Diagnosis Date  . GERD (gastroesophageal reflux disease)   . Bipolar 1 disorder   . Depression   . Headache(784.0)   . Urinary tract infection   . Kidney stones   . Carpal tunnel syndrome on both sides     with preg  . Bipolar disorder     Past Surgical History  Procedure  Laterality Date  . Tonsillectomy    . Myringotomy      Family History  Problem Relation Age of Onset  . Other Neg Hx   . Hypertension Mother   . Hypertension Father   . Hypertension Maternal Aunt   . Hypertension Maternal Uncle   . Hypertension Paternal Aunt   . Hypertension Paternal Uncle   . Diabetes Maternal Grandfather     History  Substance Use Topics  . Smoking status: Former Smoker -- 0.25 packs/day    Types: Cigarettes    Quit date: 04/19/2012  . Smokeless tobacco: Never Used  . Alcohol Use: No    OB History   Grav Para Term Preterm Abortions TAB SAB Ect Mult Living   1 1 1  0 0 0 0 0 0 1      Review of Systems  Constitutional: Negative for fever, chills and appetite change.  HENT: Negative for facial swelling, trouble swallowing, neck pain and neck stiffness.   Eyes: Positive for photophobia.  Respiratory: Negative for chest tightness and shortness of breath.   Cardiovascular: Negative for chest pain.  Gastrointestinal: Positive for nausea, abdominal pain and diarrhea. Negative for vomiting, blood in stool, abdominal distention and anal bleeding.  Genitourinary: Negative for dysuria, flank pain, decreased urine volume and difficulty urinating.  Musculoskeletal: Negative for back pain.  Skin: Negative for color change and rash.  Neurological: Positive for headaches. Negative for dizziness, syncope, weakness, light-headedness and numbness.  Hematological: Negative for adenopathy.  Psychiatric/Behavioral: Negative for confusion.  All other systems reviewed and are negative.    Allergies  Review of patient's allergies indicates no known allergies.  Home Medications   Current Outpatient Rx  Name  Route  Sig  Dispense  Refill  . hydrOXYzine (VISTARIL) 25 MG capsule   Oral   Take 25 mg by mouth 3 (three) times daily as needed for itching or anxiety.         Marland Kitchen omeprazole (PRILOSEC) 20 MG capsule   Oral   Take 20 mg by mouth daily.         .  Oxcarbazepine (TRILEPTAL) 300 MG tablet   Oral   Take 1 tablet (300 mg total) by mouth 2 (two) times daily. For mood stabilization.   60 tablet   0     BP 132/85  Pulse 87  Temp(Src) 97.8 F (36.6 C) (Oral)  Resp 24  Ht 4\' 11"  (1.499 m)  Wt 172 lb (78.019 kg)  BMI 34.72 kg/m2  SpO2 100%  LMP 12/13/2012  Physical Exam  Nursing note and vitals reviewed. Constitutional: She is oriented to person, place, and time. She appears well-developed and well-nourished. No distress.  HENT:  Head: Normocephalic and atraumatic.  Mouth/Throat: Oropharynx is clear and moist.  Neck: Normal range of motion. Neck supple.  Cardiovascular: Normal rate, regular rhythm, normal heart sounds and intact distal pulses.   No murmur heard. Pulmonary/Chest: Effort normal and breath sounds normal. No respiratory distress.  Abdominal: Soft. Normal appearance and bowel sounds are normal. She exhibits no distension and no mass. There is no hepatosplenomegaly. There is tenderness in the right upper quadrant and epigastric area. There is no rigidity, no rebound, no guarding, no CVA tenderness and no tenderness at McBurney's point.    Musculoskeletal: She exhibits no edema.  Lymphadenopathy:    She has no cervical adenopathy.  Neurological: She is alert and oriented to person, place, and time. No cranial nerve deficit or sensory deficit. She exhibits normal muscle tone. Coordination normal.  Skin: Skin is warm and dry.    ED Course  Procedures (including critical care time)  Results for orders placed during the hospital encounter of 12/20/12  URINALYSIS, ROUTINE W REFLEX MICROSCOPIC      Result Value Range   Color, Urine YELLOW  YELLOW   APPearance CLEAR  CLEAR   Specific Gravity, Urine 1.010  1.005 - 1.030   pH 6.5  5.0 - 8.0   Glucose, UA NEGATIVE  NEGATIVE mg/dL   Hgb urine dipstick SMALL (*) NEGATIVE   Bilirubin Urine NEGATIVE  NEGATIVE   Ketones, ur NEGATIVE  NEGATIVE mg/dL   Protein, ur NEGATIVE   NEGATIVE mg/dL   Urobilinogen, UA 0.2  0.0 - 1.0 mg/dL   Nitrite NEGATIVE  NEGATIVE   Leukocytes, UA MODERATE (*) NEGATIVE  PREGNANCY, URINE      Result Value Range   Preg Test, Ur NEGATIVE  NEGATIVE  URINE MICROSCOPIC-ADD ON      Result Value Range   Squamous Epithelial / LPF FEW (*) RARE   WBC, UA 11-20  <3 WBC/hpf   RBC / HPF 3-6  <3 RBC/hpf   Bacteria, UA RARE  RARE  CBC WITH DIFFERENTIAL      Result Value Range   WBC 8.2  4.0 - 10.5 K/uL   RBC 4.34  3.87 - 5.11 MIL/uL   Hemoglobin 13.0  12.0 - 15.0 g/dL   HCT 40.9  81.1 - 91.4 %   MCV 87.3  78.0 - 100.0 fL   MCH 30.0  26.0 - 34.0 pg   MCHC 34.3  30.0 - 36.0 g/dL   RDW 29.5  28.4 - 13.2 %   Platelets 328  150 - 400 K/uL   Neutrophils Relative % 51  43 - 77 %   Neutro Abs 4.2  1.7 - 7.7 K/uL   Lymphocytes Relative 40  12 - 46 %   Lymphs Abs 3.2  0.7 - 4.0 K/uL   Monocytes Relative 7  3 - 12 %   Monocytes Absolute 0.6  0.1 - 1.0 K/uL   Eosinophils Relative 2  0 - 5 %   Eosinophils Absolute 0.2  0.0 - 0.7 K/uL   Basophils Relative 0  0 - 1 %   Basophils Absolute 0.0  0.0 - 0.1 K/uL  COMPREHENSIVE METABOLIC PANEL      Result Value Range   Sodium 139  135 - 145 mEq/L   Potassium 3.6  3.5 - 5.1 mEq/L   Chloride 99  96 - 112 mEq/L   CO2 28  19 - 32 mEq/L   Glucose, Bld 105 (*) 70 - 99 mg/dL   BUN 8  6 - 23 mg/dL   Creatinine, Ser 4.40  0.50 - 1.10 mg/dL   Calcium 9.6  8.4 - 10.2 mg/dL   Total Protein 7.1  6.0 - 8.3 g/dL   Albumin 4.0  3.5 - 5.2 g/dL   AST 16  0 - 37 U/L   ALT 15  0 - 35 U/L   Alkaline Phosphatase 65  39 - 117 U/L   Total Bilirubin 0.1 (*) 0.3 - 1.2 mg/dL   GFR calc non Af Amer >90  >90 mL/min   GFR calc Af Amer >90  >90 mL/min  LIPASE, BLOOD      Result Value Range   Lipase 14  11 - 59 U/L      Urine culture is pending.   MDM    Patient has RUQ pain with deep palpation.  Mild epigastric pain as well.  Abdomen is soft , no guarding or rebound tenderness.  She is well appearing.  VSS.  Labs  were reviewed and discussed with the patient.  She has an appt on June 30 with a gastroenterologist at [facility].  She is afebrile, non-toxic appearing and she is feeling better after medications. No focal neuro deficits on exam, no meningeal signs.  Headache of gradual onset that is similar to previous. She appears stable for discharge.   I will treat her with Keflex for the UTI , vicodin #12 and she agrees to keep her scheduled appt.  She also agrees to return here if the abdominal pain worsens.       Lillias Difrancesco L. Robley Matassa, PA-C 12/21/12 0124

## 2012-12-20 NOTE — ED Notes (Signed)
Pt c/o abdominal pain with N/D and headache. States the abdominal pain and N have been going on and off for a couple of months and she is scheduled to see the gastroenterologist on the 30th but states with the headache and diarrhea starting today she "couldn't wait until then".

## 2012-12-21 LAB — COMPREHENSIVE METABOLIC PANEL
ALT: 15 U/L (ref 0–35)
AST: 16 U/L (ref 0–37)
Albumin: 4 g/dL (ref 3.5–5.2)
Alkaline Phosphatase: 65 U/L (ref 39–117)
BUN: 8 mg/dL (ref 6–23)
CO2: 28 mEq/L (ref 19–32)
Calcium: 9.6 mg/dL (ref 8.4–10.5)
Chloride: 99 mEq/L (ref 96–112)
Creatinine, Ser: 0.88 mg/dL (ref 0.50–1.10)
GFR calc Af Amer: >90 mL/min (ref >90)
GFR calc non Af Amer: >90 mL/min (ref >90)
Glucose, Bld: 105 mg/dL — ABNORMAL HIGH (ref 70–99)
Potassium: 3.6 mEq/L (ref 3.5–5.1)
Sodium: 139 mEq/L (ref 135–145)
Total Bilirubin: 0.1 mg/dL — ABNORMAL LOW (ref 0.3–1.2)
Total Protein: 7.1 g/dL (ref 6.0–8.3)

## 2012-12-21 LAB — LIPASE, BLOOD: Lipase: 14 U/L (ref 11–59)

## 2012-12-21 MED ORDER — CEPHALEXIN 500 MG PO CAPS: 500.0000 mg | ORAL_CAPSULE | Freq: Four times a day (QID) | ORAL | Status: DC

## 2012-12-21 MED ORDER — MORPHINE SULFATE 4 MG/ML IJ SOLN
4.0000 mg | Freq: Once | INTRAMUSCULAR | Status: AC
  Administered 2012-12-21: 4 mg via INTRAVENOUS
  Filled 2012-12-21: qty 1

## 2012-12-21 MED ORDER — CEPHALEXIN 500 MG PO CAPS
500.0000 mg | ORAL_CAPSULE | Freq: Once | ORAL | Status: AC
  Administered 2012-12-21: 500 mg via ORAL
  Filled 2012-12-21: qty 1

## 2012-12-21 MED ORDER — HYDROCODONE-ACETAMINOPHEN 5-325 MG PO TABS: ORAL_TABLET | ORAL | Status: DC

## 2012-12-21 NOTE — ED Notes (Signed)
Pt reporting that she is continuing to have pain.  Reinforced with pt that her pain medication was administered within last 45 min and may have not had sufficient time to begin working.

## 2012-12-22 NOTE — ED Provider Notes (Signed)
Medical screening examination/treatment/procedure(s) were performed by non-physician practitioner and as supervising physician I was immediately available for consultation/collaboration.  Nicoletta Dress. Colon Branch, MD 12/22/12 1610

## 2012-12-23 LAB — URINE CULTURE: Colony Count: >=100000

## 2013-01-06 ENCOUNTER — Emergency Department (HOSPITAL_COMMUNITY)
Admission: EM | Admit: 2013-01-06 | Discharge: 2013-01-06 | Discharge status: Against Medical Advice | Payer: Medicare Other | Attending: Emergency Medicine | Admitting: Emergency Medicine

## 2013-01-06 ENCOUNTER — Encounter (HOSPITAL_COMMUNITY): Payer: Self-pay | Admitting: Emergency Medicine

## 2013-01-06 DIAGNOSIS — F17200 Nicotine dependence, unspecified, uncomplicated: Secondary | ICD-10-CM | POA: Insufficient documentation

## 2013-01-06 DIAGNOSIS — R51 Headache: Secondary | ICD-10-CM | POA: Insufficient documentation

## 2013-01-06 DIAGNOSIS — R197 Diarrhea, unspecified: Secondary | ICD-10-CM | POA: Insufficient documentation

## 2013-01-06 DIAGNOSIS — R109 Unspecified abdominal pain: Secondary | ICD-10-CM | POA: Insufficient documentation

## 2013-01-06 DIAGNOSIS — R112 Nausea with vomiting, unspecified: Secondary | ICD-10-CM | POA: Insufficient documentation

## 2013-01-06 NOTE — ED Notes (Signed)
Onset of headache, followed by Nausea, Vomiting and Diarrhea

## 2013-03-11 ENCOUNTER — Encounter: Payer: Self-pay | Admitting: Adult Health

## 2013-03-11 ENCOUNTER — Ambulatory Visit (INDEPENDENT_AMBULATORY_CARE_PROVIDER_SITE_OTHER): Payer: Medicare Other | Admitting: Adult Health

## 2013-03-11 VITALS — BP 128/80 | Ht 60.0 in | Wt 183.0 lb

## 2013-03-11 DIAGNOSIS — N898 Other specified noninflammatory disorders of vagina: Secondary | ICD-10-CM

## 2013-03-11 DIAGNOSIS — A499 Bacterial infection, unspecified: Secondary | ICD-10-CM

## 2013-03-11 DIAGNOSIS — R1031 Right lower quadrant pain: Secondary | ICD-10-CM | POA: Insufficient documentation

## 2013-03-11 DIAGNOSIS — N760 Acute vaginitis: Secondary | ICD-10-CM

## 2013-03-11 DIAGNOSIS — B9689 Other specified bacterial agents as the cause of diseases classified elsewhere: Secondary | ICD-10-CM

## 2013-03-11 HISTORY — DX: Other specified bacterial agents as the cause of diseases classified elsewhere: B96.89

## 2013-03-11 HISTORY — DX: Other specified noninflammatory disorders of vagina: N89.8

## 2013-03-11 HISTORY — DX: Right lower quadrant pain: R10.31

## 2013-03-11 LAB — POCT WET PREP (WET MOUNT): WBC, Wet Prep HPF POC: POSITIVE

## 2013-03-11 MED ORDER — IBUPROFEN 800 MG PO TABS: 800.0000 mg | ORAL_TABLET | Freq: Three times a day (TID) | ORAL | Status: DC | PRN

## 2013-03-11 MED ORDER — METRONIDAZOLE 500 MG PO TABS: 500.0000 mg | ORAL_TABLET | Freq: Two times a day (BID) | ORAL | Status: DC

## 2013-03-11 NOTE — Patient Instructions (Addendum)
Bacterial Vaginosis Bacterial vaginosis (BV) is a vaginal infection where the normal balance of bacteria in the vagina is disrupted. The normal balance is then replaced by an overgrowth of certain bacteria. There are several different kinds of bacteria that can cause BV. BV is the most common vaginal infection in women of childbearing age. CAUSES   The cause of BV is not fully understood. BV develops when there is an increase or imbalance of harmful bacteria.  Some activities or behaviors can upset the normal balance of bacteria in the vagina and put women at increased risk including:  Having a new sex partner or multiple sex partners.  Douching.  Using an intrauterine device (IUD) for contraception.  It is not clear what role sexual activity plays in the development of BV. However, women that have never had sexual intercourse are rarely infected with BV. Women do not get BV from toilet seats, bedding, swimming pools or from touching objects around them.  SYMPTOMS   Grey vaginal discharge.  A fish-like odor with discharge, especially after sexual intercourse.  Itching or burning of the vagina and vulva.  Burning or pain with urination.  Some women have no signs or symptoms at all. DIAGNOSIS  Your caregiver must examine the vagina for signs of BV. Your caregiver will perform lab tests and look at the sample of vaginal fluid through a microscope. They will look for bacteria and abnormal cells (clue cells), a pH test higher than 4.5, and a positive amine test all associated with BV.  RISKS AND COMPLICATIONS   Pelvic inflammatory disease (PID).  Infections following gynecology surgery.  Developing HIV.  Developing herpes virus. TREATMENT  Sometimes BV will clear up without treatment. However, all women with symptoms of BV should be treated to avoid complications, especially if gynecology surgery is planned. Female partners generally do not need to be treated. However, BV may spread  between female sex partners so treatment is helpful in preventing a recurrence of BV.   BV may be treated with antibiotics. The antibiotics come in either pill or vaginal cream forms. Either can be used with nonpregnant or pregnant women, but the recommended dosages differ. These antibiotics are not harmful to the baby.  BV can recur after treatment. If this happens, a second round of antibiotics will often be prescribed.  Treatment is important for pregnant women. If not treated, BV can cause a premature delivery, especially for a pregnant woman who had a premature birth in the past. All pregnant women who have symptoms of BV should be checked and treated.  For chronic reoccurrence of BV, treatment with a type of prescribed gel vaginally twice a week is helpful. HOME CARE INSTRUCTIONS   Finish all medication as directed by your caregiver.  Do not have sex until treatment is completed.  Tell your sexual partner that you have a vaginal infection. They should see their caregiver and be treated if they have problems, such as a mild rash or itching.  Practice safe sex. Use condoms. Only have 1 sex partner. PREVENTION  Basic prevention steps can help reduce the risk of upsetting the natural balance of bacteria in the vagina and developing BV:  Do not have sexual intercourse (be abstinent).  Do not douche.  Use all of the medicine prescribed for treatment of BV, even if the signs and symptoms go away.  Tell your sex partner if you have BV. That way, they can be treated, if needed, to prevent reoccurrence. SEEK MEDICAL CARE IF:     Your symptoms are not improving after 3 days of treatment.  You have increased discharge, pain, or fever. MAKE SURE YOU:   Understand these instructions.  Will watch your condition.  Will get help right away if you are not doing well or get worse. FOR MORE INFORMATION  Division of STD Prevention (DSTDP), Centers for Disease Control and Prevention:  [employer] American Social Health Association (ASHA): www.ashastd.org  Document Released: 06/17/2005 Document Revised: 09/09/2011 Document Reviewed: 12/08/2008 [facility] Patient Information 2014 [place], [place]. Take flagyl no sex no alcohol Follow up in 1 week for [place] Take motrin

## 2013-03-11 NOTE — Progress Notes (Signed)
Subjective:     Patient ID: [name], female   DOB: 04-12-1991, 22 y.o.   MRN: [id number]  HPI Fidelis is a 22 year old white female in complaining of pain across low abdomen, right >left, has discharge with slight odor.Has sex and occasional condom use.sometimes has diarrhea with cramps.  Review of Systems Positives in HPI Reviewed past medical,surgical, social and family history. Reviewed medications and allergies.     Objective:   Physical Exam BP 128/80  Ht 5' (1.524 m)  Wt 183 lb (83.008 kg)  BMI 35.74 kg/m2  LMP 02/13/2013  Breastfeeding? No Skin warm and dry.Pelvic: external genitalia is normal in appearance, vagina: white discharge with odor, cervix:smooth and bulbous, uterus: normal size, shape and contour, mildly tender, no masses felt, adnexa: no masses, mildl tenderness noted RLQ. Wet prep: + for clue cells and +WBCs. GC/CHL obtained.     Assessment:     RLQ pain Vaginal discharge BV    Plan:     Rx flagyl 500 mg 1 bid x 7 days, no alcohol, review handout on BV, no sex   Rx motrin 800 mg # 60 1 every 8 hours prn pain with 1 refill Follow up in 1 week for vaginal [place] to r/o ovarian cyst

## 2013-03-12 ENCOUNTER — Telehealth: Payer: Self-pay | Admitting: Adult Health

## 2013-03-12 LAB — GC/CHLAMYDIA PROBE AMP
CT Probe RNA: NEGATIVE
GC Probe RNA: NEGATIVE

## 2013-03-12 NOTE — Telephone Encounter (Signed)
Left message test negative 

## 2013-03-18 ENCOUNTER — Ambulatory Visit (INDEPENDENT_AMBULATORY_CARE_PROVIDER_SITE_OTHER): Payer: Medicare Other | Admitting: Adult Health

## 2013-03-18 ENCOUNTER — Encounter: Payer: Self-pay | Admitting: Adult Health

## 2013-03-18 ENCOUNTER — Ambulatory Visit (INDEPENDENT_AMBULATORY_CARE_PROVIDER_SITE_OTHER): Payer: Medicare Other

## 2013-03-18 VITALS — BP 112/70 | Ht 60.0 in | Wt 182.0 lb

## 2013-03-18 DIAGNOSIS — N83201 Unspecified ovarian cyst, right side: Secondary | ICD-10-CM | POA: Insufficient documentation

## 2013-03-18 DIAGNOSIS — N83209 Unspecified ovarian cyst, unspecified side: Secondary | ICD-10-CM

## 2013-03-18 DIAGNOSIS — R1031 Right lower quadrant pain: Secondary | ICD-10-CM

## 2013-03-18 NOTE — Progress Notes (Signed)
Subjective:     Patient ID: Connie [name], female   DOB: [birth year], 22 y.o.   MRN: [id number]  HPI Connie [name] is here for [place] for RLQ pain ? Cyst.  Review of Systems See HPI Reviewed past medical,surgical, social and family history. Reviewed medications and allergies.     Objective:   Physical Exam BP 112/70  Ht 5' (1.524 m)  Wt 182 lb (82.555 kg)  BMI 35.54 kg/m2  LMP 02/13/2013  Breastfeeding? No   reviewed [place] with pt has right ovarian cyst ? Hemorrhagic in nature, discussed follow in it or birth control pills to shut ovaries down wants to  Watch for now   Assessment:     Right ovarian cyst    Plan:     Return in 3 months for follow up US Review handout on ovarian cyst  Use motrin for pain Call prn

## 2013-03-18 NOTE — Assessment & Plan Note (Signed)
Will follow  For now and get [place] in 3 months

## 2013-03-18 NOTE — Patient Instructions (Addendum)
Ovarian Cyst The ovaries are small organs that are on each side of the uterus. The ovaries are the organs that produce the female hormones, estrogen and progesterone. An ovarian cyst is a sac filled with fluid that can vary in its size. It is normal for a small cyst to form in women who are in the childbearing age and who have menstrual periods. This type of cyst is called a follicle cyst that becomes an ovulation cyst (corpus luteum cyst) after it produces the women's egg. It later goes away on its own if the woman does not become pregnant. There are other kinds of ovarian cysts that may cause problems and may need to be treated. The most serious problem is a cyst with cancer. It should be noted that menopausal women who have an ovarian cyst are at a higher risk of it being a cancer cyst. They should be evaluated very quickly, thoroughly and followed closely. This is especially true in menopausal women because of the high rate of ovarian cancer in women in menopause. CAUSES AND TYPES OF OVARIAN CYSTS:  FUNCTIONAL CYST: The follicle/corpus luteum cyst is a functional cyst that occurs every month during ovulation with the menstrual cycle. They go away with the next menstrual cycle if the woman does not get pregnant. Usually, there are no symptoms with a functional cyst.  ENDOMETRIOMA CYST: This cyst develops from the lining of the uterus tissue. This cyst gets in or on the ovary. It grows every month from the bleeding during the menstrual period. It is also called a "chocolate cyst" because it becomes filled with blood that turns brown. This cyst can cause pain in the lower abdomen during intercourse and with your menstrual period.  CYSTADENOMA CYST: This cyst develops from the cells on the outside of the ovary. They usually are not cancerous. They can get very big and cause lower abdomen pain and pain with intercourse. This type of cyst can twist on itself, cut off its blood supply and cause severe pain. It  also can easily rupture and cause a lot of pain.  DERMOID CYST: This type of cyst is sometimes found in both ovaries. They are found to have different kinds of body tissue in the cyst. The tissue includes skin, teeth, hair, and/or cartilage. They usually do not have symptoms unless they get very big. Dermoid cysts are rarely cancerous.  POLYCYSTIC OVARY: This is a rare condition with hormone problems that produces many small cysts on both ovaries. The cysts are follicle-like cysts that never produce an egg and become a corpus luteum. It can cause an increase in body weight, infertility, acne, increase in body and facial hair and lack of menstrual periods or rare menstrual periods. Many women with this problem develop type 2 diabetes. The exact cause of this problem is unknown. A polycystic ovary is rarely cancerous.  THECA LUTEIN CYST: Occurs when too much hormone (human chorionic gonadotropin) is produced and over-stimulates the ovaries to produce an egg. They are frequently seen when doctors stimulate the ovaries for invitro-fertilization (test tube babies).  LUTEOMA CYST: This cyst is seen during pregnancy. Rarely it can cause an obstruction to the birth canal during labor and delivery. They usually go away after delivery. SYMPTOMS   Pelvic pain or pressure.  Pain during sexual intercourse.  Increasing girth (swelling) of the abdomen.  Abnormal menstrual periods.  Increasing pain with menstrual periods.  You stop having menstrual periods and you are not pregnant. DIAGNOSIS  The diagnosis can   be made during:  Routine or annual pelvic examination (common).  Ultrasound.  X-ray of the pelvis.  CT Scan.  MRI.  Blood tests. TREATMENT   Treatment may only be to follow the cyst monthly for 2 to 3 months with your caregiver. Many go away on their own, especially functional cysts.  May be aspirated (drained) with a long needle with ultrasound, or by laparoscopy (inserting a tube into  the pelvis through a small incision).  The whole cyst can be removed by laparoscopy.  Sometimes the cyst may need to be removed through an incision in the lower abdomen.  Hormone treatment is sometimes used to help dissolve certain cysts.  Birth control pills are sometimes used to help dissolve certain cysts. HOME CARE INSTRUCTIONS  Follow your caregiver's advice regarding:  Medicine.  Follow up visits to evaluate and treat the cyst.  You may need to come back or make an appointment with another caregiver, to find the exact cause of your cyst, if your caregiver is not a gynecologist.  Get your yearly and recommended pelvic examinations and Pap tests.  Let your caregiver know if you have had an ovarian cyst in the past. SEEK MEDICAL CARE IF:   Your periods are late, irregular, they stop, or are painful.  Your stomach (abdomen) or pelvic pain does not go away.  Your stomach becomes larger or swollen.  You have pressure on your bladder or trouble emptying your bladder completely.  You have painful sexual intercourse.  You have feelings of fullness, pressure, or discomfort in your stomach.  You lose weight for no apparent reason.  You feel generally ill.  You become constipated.  You lose your appetite.  You develop acne.  You have an increase in body and facial hair.  You are gaining weight, without changing your exercise and eating habits.  You think you are pregnant. SEEK IMMEDIATE MEDICAL CARE IF:   You have increasing abdominal pain.  You feel sick to your stomach (nausea) and/or vomit.  You develop a fever that comes on suddenly.  You develop abdominal pain during a bowel movement.  Your menstrual periods become heavier than usual. Document Released: 06/17/2005 Document Revised: 09/09/2011 Document Reviewed: 04/20/2009 [facility] Patient Information 2014 [place], [place]. Follow up in 3 months for [place]

## 2013-05-27 ENCOUNTER — Encounter (HOSPITAL_COMMUNITY): Payer: Self-pay | Admitting: Emergency Medicine

## 2013-05-27 ENCOUNTER — Emergency Department (HOSPITAL_COMMUNITY)
Admission: EM | Admit: 2013-05-27 | Discharge: 2013-05-28 | Disposition: A | Discharge status: Home / Self Care | Payer: Medicare Other | Attending: Emergency Medicine | Admitting: Emergency Medicine

## 2013-05-27 DIAGNOSIS — K219 Gastro-esophageal reflux disease without esophagitis: Secondary | ICD-10-CM | POA: Insufficient documentation

## 2013-05-27 DIAGNOSIS — R112 Nausea with vomiting, unspecified: Secondary | ICD-10-CM | POA: Insufficient documentation

## 2013-05-27 DIAGNOSIS — Z79899 Other long term (current) drug therapy: Secondary | ICD-10-CM | POA: Insufficient documentation

## 2013-05-27 DIAGNOSIS — F17200 Nicotine dependence, unspecified, uncomplicated: Secondary | ICD-10-CM | POA: Insufficient documentation

## 2013-05-27 DIAGNOSIS — Z87442 Personal history of urinary calculi: Secondary | ICD-10-CM | POA: Insufficient documentation

## 2013-05-27 DIAGNOSIS — N764 Abscess of vulva: Secondary | ICD-10-CM

## 2013-05-27 DIAGNOSIS — Z8669 Personal history of other diseases of the nervous system and sense organs: Secondary | ICD-10-CM | POA: Insufficient documentation

## 2013-05-27 DIAGNOSIS — F319 Bipolar disorder, unspecified: Secondary | ICD-10-CM | POA: Insufficient documentation

## 2013-05-27 DIAGNOSIS — Z87440 Personal history of urinary (tract) infections: Secondary | ICD-10-CM | POA: Insufficient documentation

## 2013-05-27 MED ORDER — IBUPROFEN 800 MG PO TABS: 800.0000 mg | ORAL_TABLET | Freq: Three times a day (TID) | ORAL | Status: DC

## 2013-05-27 MED ORDER — ONDANSETRON 4 MG PO TBDP
4.0000 mg | ORAL_TABLET | Freq: Once | ORAL | Status: AC
  Administered 2013-05-27: 4 mg via ORAL
  Filled 2013-05-27: qty 1

## 2013-05-27 MED ORDER — LIDOCAINE HCL (PF) 1 % IJ SOLN
INTRAMUSCULAR | Status: AC
  Administered 2013-05-27
  Filled 2013-05-27: qty 5

## 2013-05-27 NOTE — ED Provider Notes (Signed)
CSN: [id number]     Arrival date & time 05/27/13  2246 History  This chart was scribed for Sunnie Nielsen, MD by Shari Heritage, ED Scribe. The patient was seen in room APA07/APA07. Patient's care was started at 11:08 PM.    Chief Complaint  Patient presents with  . Recurrent Skin Infections    abcess perineum  . Back Pain   Patient is a 22 y.o. female presenting with abscess. The history is provided by the patient. No language interpreter was used.  Abscess Location:  Ano-genital Ano-genital abscess location:  Perineum Abscess quality: painful   Abscess quality: not draining   Red streaking: no   Duration:  2 days Pain details:    Duration:  2 days   Timing:  Constant Chronicity:  New Associated symptoms: nausea and vomiting     HPI Comments: Connie [name] is a 22 y.o. female who presents to the Emergency Department complaining of a possible abscess to perineal area onset 2 days ago. She states that there is constant pain to the area which is worse with palpation. There is no drainage from the site. Patient says the raised area developed after she shaved her perineal area.There is associated mild nausea and she reports one episode of vomiting yesterday. She denies any other symptoms at this time. No F/C. Pain sharp and severe  Past Medical History  Diagnosis Date  . GERD (gastroesophageal reflux disease)   . Bipolar 1 disorder   . Depression   . Headache(784.0)   . Urinary tract infection   . Kidney stones   . Carpal tunnel syndrome on both sides     with preg  . Bipolar disorder   . Abnormal Pap smear   . Vaginal discharge 03/11/2013  . RLQ abdominal pain 03/11/2013  . BV (bacterial vaginosis) 03/11/2013  . Ovarian cyst, right    Past Surgical History  Procedure Laterality Date  . Tonsillectomy    . Myringotomy     Family History  Problem Relation Age of Onset  . Other Neg Hx   . Hypertension Mother   . Hypertension Father   . Hypertension Maternal Aunt   .  Hypertension Maternal Uncle   . Hypertension Paternal Aunt   . Hypertension Paternal Uncle   . Diabetes Maternal Grandfather    History  Substance Use Topics  . Smoking status: Light Tobacco Smoker -- 0.25 packs/day for 1 years    Types: Cigarettes    Last Attempt to Quit: 04/19/2012  . Smokeless tobacco: Never Used  . Alcohol Use: No   OB History   Grav Para Term Preterm Abortions TAB SAB Ect Mult Living   1 1 1  0 0 0 0 0 0 1     Review of Systems  Gastrointestinal: Positive for nausea and vomiting.  Genitourinary:       Positive for abscess to perineal area.  All other systems reviewed and are negative.    Allergies  Review of patient's allergies indicates no known allergies.  Home Medications   Current Outpatient Rx  Name  Route  Sig  Dispense  Refill  . amitriptyline (ELAVIL) 50 MG tablet   Oral   Take 50 mg by mouth at bedtime.         Marland Kitchen ibuprofen (ADVIL,MOTRIN) 800 MG tablet   Oral   Take 1 tablet (800 mg total) by mouth every 8 (eight) hours as needed for pain.   60 tablet   1   . metroNIDAZOLE (FLAGYL)  500 MG tablet   Oral   Take 1 tablet (500 mg total) by mouth 2 (two) times daily.   14 tablet   0   . omeprazole (PRILOSEC) 20 MG capsule   Oral   Take 20 mg by mouth daily.         . Oxcarbazepine (TRILEPTAL) 300 MG tablet   Oral   Take 1 tablet (300 mg total) by mouth 2 (two) times daily. For mood stabilization.   60 tablet   0    BP 129/67  Pulse 113  Temp(Src) 98.3 F (36.8 C) (Oral)  Resp 18  Ht 5' (1.524 m)  Wt 182 lb (82.555 kg)  BMI 35.54 kg/m2  SpO2 100%  LMP 04/29/2013 Physical Exam  Nursing note and vitals reviewed. Constitutional: She is oriented to person, place, and time. She appears well-developed and well-nourished. No distress.  HENT:  Head: Normocephalic and atraumatic.  Eyes: EOM are normal.  Neck: Neck supple. No tracheal deviation present.  Cardiovascular: Normal rate.   Pulmonary/Chest: Effort normal. No  respiratory distress.  Genitourinary:  Right labial area of tenderness, erythema and fluctuance with pointing. No GU lesions otherwise.  Musculoskeletal: Normal range of motion.  Neurological: She is alert and oriented to person, place, and time.  Skin: Skin is warm and dry.  Psychiatric: She has a normal mood and affect. Her behavior is normal.    ED Course  Procedures (including critical care time) DIAGNOSTIC STUDIES: Oxygen Saturation is 100% on room air, normal by my interpretation.    COORDINATION OF CARE: 11:12 PM- Area of tenderness, erythema and fluctuance noted to right labial area. Will perform I&D. Patient informed of current plan for treatment and evaluation and agrees with plan at this time.   INCISION AND DRAINAGE PROCEDURE NOTE: Patient identification was confirmed and verbal consent was obtained. This procedure was performed by Sunnie Nielsen, MD at 11:13 PM. Site: right labial area Sterile procedures observed Needle size: 25 Anesthetic used (type and amt): 1% lido 2cc Blade size: 11 Drainage: large amount purulent drainage Complexity: Complex No packing  Site anesthetized, incision made over site, wound drained and explored loculations, rinsed with copious amounts of normal saline, wound  covered with dry, sterile dressing.  Pt tolerated procedure well without complications.  Instructions for care discussed verbally and pt provided with additional written instructions for homecare and f/u.  zofran for nausea  Plan d/c home, followup 48 hours. Patient agrees to plan warm compresses, rest. She will watch for fever, increasing pain after tomorrow, any increased swelling or worsening condition. She is a reliable historian and states understanding all discharge and followup instructions.  MDM  Diagnosis: Right labial abscess.  Medication provided. Incision and drainage as above Vital signs nurse's notes reviewed and considered  I personally performed the  services described in this documentation, which was scribed in my presence. The recorded information has been reviewed and is accurate.    Sunnie Nielsen, MD 05/27/13 [id number]

## 2013-05-27 NOTE — ED Notes (Signed)
Skin infection ? From shaving perineum - also c/o pain left lower back.  Nausea with vomiting x 1 yesterday

## 2013-05-27 NOTE — ED Notes (Signed)
Pt reports abscess to her perineum after shaving. States it has progressively gotten worse over the last couple of days.

## 2013-06-17 ENCOUNTER — Ambulatory Visit (INDEPENDENT_AMBULATORY_CARE_PROVIDER_SITE_OTHER): Payer: Medicare Other

## 2013-06-17 ENCOUNTER — Other Ambulatory Visit: Payer: Self-pay | Admitting: Adult Health

## 2013-06-17 ENCOUNTER — Ambulatory Visit (INDEPENDENT_AMBULATORY_CARE_PROVIDER_SITE_OTHER): Payer: Medicare Other | Admitting: Adult Health

## 2013-06-17 ENCOUNTER — Encounter: Payer: Self-pay | Admitting: Adult Health

## 2013-06-17 VITALS — BP 122/80 | Ht 60.0 in | Wt 190.0 lb

## 2013-06-17 DIAGNOSIS — E669 Obesity, unspecified: Secondary | ICD-10-CM

## 2013-06-17 DIAGNOSIS — N898 Other specified noninflammatory disorders of vagina: Secondary | ICD-10-CM | POA: Insufficient documentation

## 2013-06-17 DIAGNOSIS — R635 Abnormal weight gain: Secondary | ICD-10-CM

## 2013-06-17 DIAGNOSIS — A499 Bacterial infection, unspecified: Secondary | ICD-10-CM

## 2013-06-17 DIAGNOSIS — N83201 Unspecified ovarian cyst, right side: Secondary | ICD-10-CM

## 2013-06-17 DIAGNOSIS — B9689 Other specified bacterial agents as the cause of diseases classified elsewhere: Secondary | ICD-10-CM

## 2013-06-17 DIAGNOSIS — Z8742 Personal history of other diseases of the female genital tract: Secondary | ICD-10-CM

## 2013-06-17 DIAGNOSIS — N760 Acute vaginitis: Secondary | ICD-10-CM

## 2013-06-17 DIAGNOSIS — N83209 Unspecified ovarian cyst, unspecified side: Secondary | ICD-10-CM

## 2013-06-17 HISTORY — DX: Obesity, unspecified: E66.9

## 2013-06-17 LAB — COMPREHENSIVE METABOLIC PANEL
ALT: 18 U/L (ref 0–35)
AST: 16 U/L (ref 0–37)
Albumin: 4.5 g/dL (ref 3.5–5.2)
Alkaline Phosphatase: 63 U/L (ref 39–117)
BUN: 6 mg/dL (ref 6–23)
CO2: 29 mEq/L (ref 19–32)
Calcium: 9.9 mg/dL (ref 8.4–10.5)
Chloride: 102 mEq/L (ref 96–112)
Creat: 0.78 mg/dL (ref 0.50–1.10)
Glucose, Bld: 79 mg/dL (ref 70–99)
Potassium: 4.7 mEq/L (ref 3.5–5.3)
Sodium: 141 mEq/L (ref 135–145)
Total Bilirubin: 0.3 mg/dL (ref 0.3–1.2)
Total Protein: 6.8 g/dL (ref 6.0–8.3)

## 2013-06-17 LAB — POCT WET PREP (WET MOUNT): WBC, Wet Prep HPF POC: POSITIVE

## 2013-06-17 LAB — TSH: TSH: 1.705 u[IU]/mL (ref 0.350–4.500)

## 2013-06-17 MED ORDER — METRONIDAZOLE 500 MG PO TABS: 500.0000 mg | ORAL_TABLET | Freq: Two times a day (BID) | ORAL | Status: DC

## 2013-06-17 NOTE — Patient Instructions (Signed)
physical in 2015 Follow labs Obesity Obesity is defined as having too much total body fat and a body mass index (BMI) of 30 or more. BMI is an estimate of body fat and is calculated from your height and weight. Obesity happens when you consume more calories than you can burn by exercising or performing daily physical tasks. Prolonged obesity can cause major illnesses or emergencies, such as:   A stroke.  Heart disease.  Diabetes.  Cancer.  Arthritis.  High blood pressure (hypertension).  High cholesterol.  Sleep apnea.  Erectile dysfunction.  Infertility problems. CAUSES   Regularly eating unhealthy foods.  Physical inactivity.  Certain disorders, such as an underactive thyroid (hypothyroidism), Cushing's syndrome, and polycystic ovarian syndrome.  Certain medicines, such as steroids, some depression medicines, and antipsychotics.  Genetics.  Lack of sleep. DIAGNOSIS  A caregiver can diagnose obesity after calculating your BMI. Obesity will be diagnosed if your BMI is 30 or higher.  There are other methods of measuring obesity levels. Some other methods include measuring your skin fold thickness, your waist circumference, and comparing your hip circumference to your waist circumference. TREATMENT  A healthy treatment program includes some or all of the following:  Long-term dietary changes.  Exercise and physical activity.  Behavioral and lifestyle changes.  Medicine only under the supervision of your caregiver. Medicines may help, but only if they are used with diet and exercise programs. An unhealthy treatment program includes:  Fasting.  Fad diets.  Supplements and drugs. These choices do not succeed in long-term weight control.  HOME CARE INSTRUCTIONS   Exercise and perform physical activity as directed by your caregiver. To increase physical activity, try the following:  Use stairs instead of elevators.  Park farther away from store  entrances.  Garden, bike, or walk instead of watching television or using the computer.  Eat healthy, low-calorie foods and drinks on a regular basis. Eat more fruits and vegetables. Use low-calorie cookbooks or take healthy cooking classes.  Limit fast food, sweets, and processed snack foods.  Eat smaller portions.  Keep a daily journal of everything you eat. There are many free websites to help you with this. It may be helpful to measure your foods so you can determine if you are eating the correct portion sizes.  Avoid drinking alcohol. Drink more water and drinks without calories.  Take vitamins and supplements only as recommended by your caregiver.  Weight-loss support groups, Optometrist, counselors, and stress reduction education can also be very helpful. SEEK IMMEDIATE MEDICAL CARE IF:  You have chest pain or tightness.  You have trouble breathing or feel short of breath.  You have weakness or leg numbness.  You feel confused or have trouble talking.  You have sudden changes in your vision. MAKE SURE YOU:  Understand these instructions.  Will watch your condition.  Will get help right away if you are not doing well or get worse. Document Released: 07/25/2004 Document Revised: 12/17/2011 Document Reviewed: 07/24/2011 [facility] Patient Information 2014 [place], [place]. Bacterial Vaginosis Bacterial vaginosis (BV) is a vaginal infection where the normal balance of bacteria in the vagina is disrupted. The normal balance is then replaced by an overgrowth of certain bacteria. There are several different kinds of bacteria that can cause BV. BV is the most common vaginal infection in women of childbearing age. CAUSES   The cause of BV is not fully understood. BV develops when there is an increase or imbalance of harmful bacteria.  Some activities or behaviors  can upset the normal balance of bacteria in the vagina and put women at increased risk including:  Having  a new sex partner or multiple sex partners.  Douching.  Using an intrauterine device (IUD) for contraception.  It is not clear what role sexual activity plays in the development of BV. However, women that have never had sexual intercourse are rarely infected with BV. Women do not get BV from toilet seats, bedding, swimming pools or from touching objects around them.  SYMPTOMS   Grey vaginal discharge.  A fish-like odor with discharge, especially after sexual intercourse.  Itching or burning of the vagina and vulva.  Burning or pain with urination.  Some women have no signs or symptoms at all. DIAGNOSIS  Your caregiver must examine the vagina for signs of BV. Your caregiver will perform lab tests and look at the sample of vaginal fluid through a microscope. They will look for bacteria and abnormal cells (clue cells), a pH test higher than 4.5, and a positive amine test all associated with BV.  RISKS AND COMPLICATIONS   Pelvic inflammatory disease (PID).  Infections following gynecology surgery.  Developing HIV.  Developing herpes virus. TREATMENT  Sometimes BV will clear up without treatment. However, all women with symptoms of BV should be treated to avoid complications, especially if gynecology surgery is planned. Female partners generally do not need to be treated. However, BV may spread between female sex partners so treatment is helpful in preventing a recurrence of BV.   BV may be treated with antibiotics. The antibiotics come in either pill or vaginal cream forms. Either can be used with nonpregnant or pregnant women, but the recommended dosages differ. These antibiotics are not harmful to the baby.  BV can recur after treatment. If this happens, a second round of antibiotics will often be prescribed.  Treatment is important for pregnant women. If not treated, BV can cause a premature delivery, especially for a pregnant woman who had a premature birth in the past. All  pregnant women who have symptoms of BV should be checked and treated.  For chronic reoccurrence of BV, treatment with a type of prescribed gel vaginally twice a week is helpful. HOME CARE INSTRUCTIONS   Finish all medication as directed by your caregiver.  Do not have sex until treatment is completed.  Tell your sexual partner that you have a vaginal infection. They should see their caregiver and be treated if they have problems, such as a mild rash or itching.  Practice safe sex. Use condoms. Only have 1 sex partner. PREVENTION  Basic prevention steps can help reduce the risk of upsetting the natural balance of bacteria in the vagina and developing BV:  Do not have sexual intercourse (be abstinent).  Do not douche.  Use all of the medicine prescribed for treatment of BV, even if the signs and symptoms go away.  Tell your sex partner if you have BV. That way, they can be treated, if needed, to prevent reoccurrence. SEEK MEDICAL CARE IF:   Your symptoms are not improving after 3 days of treatment.  You have increased discharge, pain, or fever. MAKE SURE YOU:   Understand these instructions.  Will watch your condition.  Will get help right away if you are not doing well or get worse. FOR MORE INFORMATION  Division of STD Prevention (DSTDP), Centers for Disease Control and Prevention: [employer] American Social Health Association (ASHA): www.ashastd.org  Document Released: 06/17/2005 Document Revised: 09/09/2011 Document Reviewed: 01/27/2013 [employer] Patient Information  2014 [employer].  

## 2013-06-17 NOTE — Progress Notes (Signed)
Subjective:     Patient ID: [name], female   DOB: [birth year], 22 y.o.   MRN: [id number]  HPI Ramina is in for 3 month follow up US for right ovarian cyst.She is complaining of weight gain and vaginal discharge thinks it is BV.No current sex partner.  Review of Systems See HPI Reviewed past medical,surgical, social and family history. Reviewed medications and allergies.     Objective:   Physical Exam BP 122/80  Ht 5' (1.524 m)  Wt 190 lb (86.183 kg)  BMI 37.11 kg/m2  LMP 06/12/2013  Breastfeeding? No Skin warm and dry.Pelvic: external genitalia is normal in appearance, vagina: white discharge with odor, cervix:smooth and bulbous, uterus: normal size, shape and contour, non tender, no masses felt, adnexa: no masses or tenderness noted. Wet prep: + for clue cells and +WBCs.US showed resolved ovarian cyst and normal exam.    Assessment:     Resolved ovarian cyst Vaginal discharge BV Weight gain Obesity     Plan:     Check CMP and TSH Discussed eating changes and exercise Follow up labs by phone,review handout on obesity Rx flagyl 500 mg 1 bid x 7 days, no alcohol, review handout on BV   Physical next year

## 2013-07-22 ENCOUNTER — Emergency Department (HOSPITAL_COMMUNITY)
Admission: EM | Admit: 2013-07-22 | Discharge: 2013-07-22 | Disposition: A | Discharge status: Home / Self Care | Payer: Medicare Other | Attending: Emergency Medicine | Admitting: Emergency Medicine

## 2013-07-22 ENCOUNTER — Encounter (HOSPITAL_COMMUNITY): Payer: Self-pay | Admitting: Emergency Medicine

## 2013-07-22 DIAGNOSIS — E669 Obesity, unspecified: Secondary | ICD-10-CM | POA: Insufficient documentation

## 2013-07-22 DIAGNOSIS — R000 Tachycardia, unspecified: Secondary | ICD-10-CM | POA: Insufficient documentation

## 2013-07-22 DIAGNOSIS — F319 Bipolar disorder, unspecified: Secondary | ICD-10-CM | POA: Insufficient documentation

## 2013-07-22 DIAGNOSIS — K219 Gastro-esophageal reflux disease without esophagitis: Secondary | ICD-10-CM | POA: Insufficient documentation

## 2013-07-22 DIAGNOSIS — X503XXA Overexertion from repetitive movements, initial encounter: Secondary | ICD-10-CM | POA: Insufficient documentation

## 2013-07-22 DIAGNOSIS — F17200 Nicotine dependence, unspecified, uncomplicated: Secondary | ICD-10-CM | POA: Insufficient documentation

## 2013-07-22 DIAGNOSIS — X500XXA Overexertion from strenuous movement or load, initial encounter: Secondary | ICD-10-CM | POA: Insufficient documentation

## 2013-07-22 DIAGNOSIS — Z79899 Other long term (current) drug therapy: Secondary | ICD-10-CM | POA: Insufficient documentation

## 2013-07-22 DIAGNOSIS — Z8669 Personal history of other diseases of the nervous system and sense organs: Secondary | ICD-10-CM | POA: Insufficient documentation

## 2013-07-22 DIAGNOSIS — N920 Excessive and frequent menstruation with regular cycle: Secondary | ICD-10-CM | POA: Insufficient documentation

## 2013-07-22 DIAGNOSIS — S239XXA Sprain of unspecified parts of thorax, initial encounter: Secondary | ICD-10-CM | POA: Insufficient documentation

## 2013-07-22 DIAGNOSIS — IMO0002 Reserved for concepts with insufficient information to code with codable children: Secondary | ICD-10-CM

## 2013-07-22 DIAGNOSIS — Z87442 Personal history of urinary calculi: Secondary | ICD-10-CM | POA: Insufficient documentation

## 2013-07-22 DIAGNOSIS — L0231 Cutaneous abscess of buttock: Secondary | ICD-10-CM | POA: Insufficient documentation

## 2013-07-22 DIAGNOSIS — Y929 Unspecified place or not applicable: Secondary | ICD-10-CM | POA: Insufficient documentation

## 2013-07-22 DIAGNOSIS — L03317 Cellulitis of buttock: Principal | ICD-10-CM

## 2013-07-22 DIAGNOSIS — Y9389 Activity, other specified: Secondary | ICD-10-CM | POA: Insufficient documentation

## 2013-07-22 DIAGNOSIS — Z87440 Personal history of urinary (tract) infections: Secondary | ICD-10-CM | POA: Insufficient documentation

## 2013-07-22 DIAGNOSIS — Z791 Long term (current) use of non-steroidal anti-inflammatories (NSAID): Secondary | ICD-10-CM | POA: Insufficient documentation

## 2013-07-22 MED ORDER — LIDOCAINE HCL (PF) 1 % IJ SOLN
INTRAMUSCULAR | Status: AC
  Filled 2013-07-22: qty 5

## 2013-07-22 MED ORDER — CYCLOBENZAPRINE HCL 10 MG PO TABS: 10.0000 mg | ORAL_TABLET | Freq: Two times a day (BID) | ORAL | Status: DC | PRN

## 2013-07-22 MED ORDER — HYDROCODONE-ACETAMINOPHEN 5-325 MG PO TABS: 1.0000 | ORAL_TABLET | ORAL | Status: DC | PRN

## 2013-07-22 MED ORDER — SULFAMETHOXAZOLE-TRIMETHOPRIM 800-160 MG PO TABS: 1.0000 | ORAL_TABLET | Freq: Two times a day (BID) | ORAL | Status: AC

## 2013-07-22 MED ORDER — SULFAMETHOXAZOLE-TMP DS 800-160 MG PO TABS
1.0000 | ORAL_TABLET | Freq: Once | ORAL | Status: AC
  Administered 2013-07-22: 1 via ORAL
  Filled 2013-07-22: qty 1

## 2013-07-22 MED ORDER — OXYCODONE-ACETAMINOPHEN 5-325 MG PO TABS
1.0000 | ORAL_TABLET | Freq: Once | ORAL | Status: AC
  Administered 2013-07-22: 1 via ORAL
  Filled 2013-07-22: qty 1

## 2013-07-22 NOTE — ED Provider Notes (Signed)
CSN: [id number]     Arrival date & time 07/22/13  2055 History   First MD Initiated Contact with Patient 07/22/13 2117     Chief Complaint  Patient presents with  . Back Pain  . Abscess   (Consider location/radiation/quality/duration/timing/severity/associated sxs/prior Treatment) Patient is a 23 y.o. female presenting with back pain and abscess. The history is provided by the patient.  Back Pain Location:  Thoracic spine Quality:  Aching Radiates to:  Does not radiate Pain severity:  Moderate Pain is:  Same all the time Onset quality:  Gradual Duration:  2 days Timing:  Constant Progression:  Worsening Chronicity:  New Relieved by:  Nothing Worsened by:  Movement and palpation Ineffective treatments:  Ibuprofen Associated symptoms: no abdominal pain, no bladder incontinence, no bowel incontinence, no chest pain, no dysuria, no fever, no headaches, no numbness, no tingling and no weakness   Abscess Associated symptoms: no fever, no headaches, no nausea and no vomiting    Connie [name] is a 23 y.o. female who presents to the ED with pain and spasm in the right thoracic area that started 2 days ago. 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 She also complains of an abscess to the right buttock. She had one near her right labia that she popped herself and then another one came up. She has been taking ibuprofen for pain without relief.  Past Medical History  Diagnosis Date  . GERD (gastroesophageal reflux disease)   . Bipolar 1 disorder   . Depression   . Headache(784.0)   . Urinary tract infection   . Kidney stones   . Carpal tunnel syndrome on both sides     with preg  . Bipolar disorder   . Abnormal Pap smear   . Vaginal discharge 03/11/2013  . RLQ abdominal pain 03/11/2013  . BV (bacterial vaginosis) 03/11/2013  . Ovarian cyst, right   . Obesity 06/17/2013   Past Surgical History  Procedure Laterality Date  . Tonsillectomy     . Myringotomy     Family History  Problem Relation Age of Onset  . Other Neg Hx   . Hypertension Mother   . Hypertension Father   . Hypertension Maternal Aunt   . Hypertension Maternal Uncle   . Hypertension Paternal Aunt   . Hypertension Paternal Uncle   . Diabetes Maternal Grandfather    History  Substance Use Topics  . Smoking status: Light Tobacco Smoker -- 0.25 packs/day for 1 years    Types: Cigarettes    Last Attempt to Quit: 04/19/2012  . Smokeless tobacco: Never Used  . Alcohol Use: No   OB History   Grav Para Term Preterm Abortions TAB SAB Ect Mult Living   1 1 1  0 0 0 0 0 0 1     Review of Systems  Constitutional: Negative for fever and chills.  HENT: Negative.   Eyes: Negative for pain and itching.  Respiratory: Negative for cough.   Cardiovascular: Negative for chest pain.  Gastrointestinal: Negative for nausea, vomiting, abdominal pain and bowel incontinence.  Genitourinary: Positive for vaginal bleeding (menses). Negative for bladder incontinence and dysuria.  Musculoskeletal: Positive for back pain.  Skin: Positive for wound.       Abscess to right buttock  Neurological: Negative for tingling, weakness, numbness and headaches.  Psychiatric/Behavioral: Negative for confusion. The patient is not nervous/anxious.     Allergies  Review of patient's allergies indicates  no known allergies.  Home Medications   Current Outpatient Rx  Name  Route  Sig  Dispense  Refill  . amitriptyline (ELAVIL) 50 MG tablet   Oral   Take 50 mg by mouth at bedtime.         Marland Kitchen ibuprofen (ADVIL,MOTRIN) 800 MG tablet   Oral   Take 1 tablet (800 mg total) by mouth every 8 (eight) hours as needed for pain.   60 tablet   1   . ibuprofen (ADVIL,MOTRIN) 800 MG tablet   Oral   Take 1 tablet (800 mg total) by mouth 3 (three) times daily.   21 tablet   0   . metroNIDAZOLE (FLAGYL) 500 MG tablet   Oral   Take 1 tablet (500 mg total) by mouth 2 (two) times daily.   14  tablet   2   . omeprazole (PRILOSEC) 20 MG capsule   Oral   Take 20 mg by mouth daily.         . Oxcarbazepine (TRILEPTAL) 300 MG tablet   Oral   Take 1 tablet (300 mg total) by mouth 2 (two) times daily. For mood stabilization.   60 tablet   0    BP 136/75  Pulse 105  Temp(Src) 98.2 F (36.8 C) (Oral)  Resp 18  Ht 4\' 11"  (1.499 m)  Wt 184 lb (83.462 kg)  BMI 37.14 kg/m2  SpO2 100%  LMP 07/22/2013 Physical Exam  Nursing note and vitals reviewed. Constitutional: She is oriented to person, place, and time. She appears well-developed and well-nourished. No distress.  HENT:  Head: Normocephalic and atraumatic.  Eyes: EOM are normal.  Neck: Neck supple.  Cardiovascular: Regular rhythm.  Tachycardia present.   Pulmonary/Chest: Effort normal and breath sounds normal.  Abdominal: Soft. There is no tenderness.  Genitourinary:     Draining abscess right buttock.  Musculoskeletal: Normal range of motion.       Thoracic back: She exhibits tenderness and spasm. She exhibits normal range of motion and normal pulse.       Back:  Radial and pedal pulses equal. Adequate circulation. Good touch sensation.  Neurological: She is alert and oriented to person, place, and time. She has normal strength and normal reflexes. No cranial nerve deficit or sensory deficit. Gait normal.  Pedal pulses equal, adequate circulation.  Skin: Skin is warm and dry.  Psychiatric: She has a normal mood and affect. Her behavior is normal.    ED Course  Procedures  MDM  23 y.o. female with right thoracic pain and drain abscess of the right buttock. Will treat abscess with antibiotics and pain medication. Will treat muscle strain with muscle relaxant. Discussed with the patient clinical findings and plan of care. All questioned fully answered.   Medication List    STOP taking these medications       metroNIDAZOLE 500 MG tablet  Commonly known as:  FLAGYL      TAKE these medications        cyclobenzaprine 10 MG tablet  Commonly known as:  FLEXERIL  Take 1 tablet (10 mg total) by mouth 2 (two) times daily as needed for muscle spasms.     HYDROcodone-acetaminophen 5-325 MG per tablet  Commonly known as:  NORCO/VICODIN  Take 1 tablet by mouth every 4 (four) hours as needed.     sulfamethoxazole-trimethoprim 800-160 MG per tablet  Commonly known as:  BACTRIM DS,SEPTRA DS  Take 1 tablet by mouth 2 (two) times daily.  ASK your doctor about these medications       amitriptyline 50 MG tablet  Commonly known as:  ELAVIL  Take 50 mg by mouth at bedtime.     ibuprofen 800 MG tablet  Commonly known as:  ADVIL,MOTRIN  Take 1 tablet (800 mg total) by mouth 3 (three) times daily.  Ask about: Which instructions should I use?     omeprazole 20 MG capsule  Commonly known as:  PRILOSEC  Take 20 mg by mouth daily.     Oxcarbazepine 300 MG tablet  Commonly known as:  TRILEPTAL  Take 1 tablet (300 mg total) by mouth 2 (two) times daily. For mood stabilization.         [address]Connie Holquin Wood[place] Lema Heinkel, [place]NP 07/23/13 [phone]

## 2013-07-22 NOTE — Discharge Instructions (Signed)
Sit in warm tubs of water or apply warm wet compresses to the abscess several times a day. Take the antibiotic as directed. Do not take the narcotic or muscle relaxant if you are driving as it will make you sleepy.  Abscess An abscess (boil or furuncle) is an infected area on or under the skin. This area is filled with yellowish-white fluid (pus) and other material (debris). HOME CARE   Only take medicines as told by your doctor.  If you were given antibiotic medicine, take it as directed. Finish the medicine even if you start to feel better.  If gauze is used, follow your doctor's directions for changing the gauze.  To avoid spreading the infection:  Keep your abscess covered with a bandage.  Wash your hands well.  Do not share personal care items, towels, or whirlpools with others.  Avoid skin contact with others.  Keep your skin and clothes clean around the abscess.  Keep all doctor visits as told. GET HELP RIGHT AWAY IF:   You have more pain, puffiness (swelling), or redness in the wound site.  You have more fluid or blood coming from the wound site.  You have muscle aches, chills, or you feel sick.  You have a fever. MAKE SURE YOU:   Understand these instructions.  Will watch your condition.  Will get help right away if you are not doing well or get worse. Document Released: 12/04/2007 Document Revised: 12/17/2011 Document Reviewed: 08/30/2011 [facility] Patient Information 2014 [place], [place].

## 2013-07-22 NOTE — ED Notes (Signed)
Pt c/o intermittent R side thoracic pain x 2 days. Pt also reports abscess to her R buttock that started after she "popped" an abscess in the R labial area on Sun.

## 2013-07-23 NOTE — ED Provider Notes (Signed)
Medical screening examination/treatment/procedure(s) were performed by non-physician practitioner and as supervising physician I was immediately available for consultation/collaboration.  EKG Interpretation   None       Juanjose Mojica, MD, FACEP   Jalyah Weinheimer L Ashantia Amaral, MD 07/23/13 0113 

## 2013-09-25 ENCOUNTER — Encounter (HOSPITAL_COMMUNITY): Payer: Self-pay | Admitting: Emergency Medicine

## 2013-09-25 ENCOUNTER — Emergency Department (HOSPITAL_COMMUNITY)
Admission: EM | Admit: 2013-09-25 | Discharge: 2013-09-25 | Disposition: A | Discharge status: Home / Self Care | Payer: Medicare Other | Attending: Emergency Medicine | Admitting: Emergency Medicine

## 2013-09-25 DIAGNOSIS — Z8669 Personal history of other diseases of the nervous system and sense organs: Secondary | ICD-10-CM | POA: Insufficient documentation

## 2013-09-25 DIAGNOSIS — E669 Obesity, unspecified: Secondary | ICD-10-CM | POA: Insufficient documentation

## 2013-09-25 DIAGNOSIS — K529 Noninfective gastroenteritis and colitis, unspecified: Secondary | ICD-10-CM

## 2013-09-25 DIAGNOSIS — K219 Gastro-esophageal reflux disease without esophagitis: Secondary | ICD-10-CM | POA: Insufficient documentation

## 2013-09-25 DIAGNOSIS — Z87442 Personal history of urinary calculi: Secondary | ICD-10-CM | POA: Insufficient documentation

## 2013-09-25 DIAGNOSIS — Z87448 Personal history of other diseases of urinary system: Secondary | ICD-10-CM | POA: Insufficient documentation

## 2013-09-25 DIAGNOSIS — Z3202 Encounter for pregnancy test, result negative: Secondary | ICD-10-CM | POA: Insufficient documentation

## 2013-09-25 DIAGNOSIS — Z79899 Other long term (current) drug therapy: Secondary | ICD-10-CM | POA: Insufficient documentation

## 2013-09-25 DIAGNOSIS — K5289 Other specified noninfective gastroenteritis and colitis: Secondary | ICD-10-CM | POA: Insufficient documentation

## 2013-09-25 DIAGNOSIS — Z792 Long term (current) use of antibiotics: Secondary | ICD-10-CM | POA: Insufficient documentation

## 2013-09-25 DIAGNOSIS — F17200 Nicotine dependence, unspecified, uncomplicated: Secondary | ICD-10-CM | POA: Insufficient documentation

## 2013-09-25 DIAGNOSIS — F319 Bipolar disorder, unspecified: Secondary | ICD-10-CM | POA: Insufficient documentation

## 2013-09-25 LAB — URINALYSIS, ROUTINE W REFLEX MICROSCOPIC
Bilirubin Urine: NEGATIVE
Glucose, UA: NEGATIVE mg/dL
Hgb urine dipstick: NEGATIVE
Ketones, ur: NEGATIVE mg/dL
Leukocytes, UA: NEGATIVE
Nitrite: NEGATIVE
Protein, ur: NEGATIVE mg/dL
Specific Gravity, Urine: 1.02 (ref 1.005–1.030)
Urobilinogen, UA: 0.2 mg/dL (ref 0.0–1.0)
pH: 6 (ref 5.0–8.0)

## 2013-09-25 LAB — CBC WITH DIFFERENTIAL/PLATELET
Basophils Absolute: 0 10*3/uL (ref 0.0–0.1)
Basophils Relative: 0 % (ref 0–1)
Eosinophils Absolute: 0.2 10*3/uL (ref 0.0–0.7)
Eosinophils Relative: 3 % (ref 0–5)
HCT: 39.7 % (ref 36.0–46.0)
Hemoglobin: 13 g/dL (ref 12.0–15.0)
Lymphocytes Relative: 16 % (ref 12–46)
Lymphs Abs: 1.1 10*3/uL (ref 0.7–4.0)
MCH: 29.9 pg (ref 26.0–34.0)
MCHC: 32.7 g/dL (ref 30.0–36.0)
MCV: 91.3 fL (ref 78.0–100.0)
Monocytes Absolute: 0.5 10*3/uL (ref 0.1–1.0)
Monocytes Relative: 7 % (ref 3–12)
Neutro Abs: 5 10*3/uL (ref 1.7–7.7)
Neutrophils Relative %: 74 % (ref 43–77)
Platelets: 244 10*3/uL (ref 150–400)
RBC: 4.35 MIL/uL (ref 3.87–5.11)
RDW: 14.1 % (ref 11.5–15.5)
WBC: 6.7 10*3/uL (ref 4.0–10.5)

## 2013-09-25 LAB — COMPREHENSIVE METABOLIC PANEL
ALT: 10 U/L (ref 0–35)
AST: 16 U/L (ref 0–37)
Albumin: 4 g/dL (ref 3.5–5.2)
Alkaline Phosphatase: 70 U/L (ref 39–117)
BUN: 4 mg/dL — ABNORMAL LOW (ref 6–23)
CO2: 25 mEq/L (ref 19–32)
Calcium: 9.4 mg/dL (ref 8.4–10.5)
Chloride: 101 mEq/L (ref 96–112)
Creatinine, Ser: 0.79 mg/dL (ref 0.50–1.10)
GFR calc Af Amer: >90 mL/min (ref >90)
GFR calc non Af Amer: >90 mL/min (ref >90)
Glucose, Bld: 104 mg/dL — ABNORMAL HIGH (ref 70–99)
Potassium: 3.9 mEq/L (ref 3.7–5.3)
Sodium: 138 mEq/L (ref 137–147)
Total Bilirubin: 0.3 mg/dL (ref 0.3–1.2)
Total Protein: 7.5 g/dL (ref 6.0–8.3)

## 2013-09-25 LAB — PREGNANCY, URINE: Preg Test, Ur: NEGATIVE

## 2013-09-25 LAB — LIPASE, BLOOD: Lipase: 18 U/L (ref 11–59)

## 2013-09-25 MED ORDER — PROMETHAZINE HCL 25 MG PO TABS: 25.0000 mg | ORAL_TABLET | Freq: Four times a day (QID) | ORAL | Status: DC | PRN

## 2013-09-25 MED ORDER — HYDROCODONE-ACETAMINOPHEN 5-325 MG PO TABS: 2.0000 | ORAL_TABLET | ORAL | Status: DC | PRN

## 2013-09-25 MED ORDER — MORPHINE SULFATE 4 MG/ML IJ SOLN
4.0000 mg | Freq: Once | INTRAMUSCULAR | Status: AC
  Administered 2013-09-25: 4 mg via INTRAVENOUS
  Filled 2013-09-25: qty 1

## 2013-09-25 MED ORDER — SODIUM CHLORIDE 0.9 % IV BOLUS (SEPSIS)
1000.0000 mL | Freq: Once | INTRAVENOUS | Status: AC
  Administered 2013-09-25: 1000 mL via INTRAVENOUS

## 2013-09-25 MED ORDER — ONDANSETRON HCL 4 MG/2ML IJ SOLN
4.0000 mg | Freq: Once | INTRAMUSCULAR | Status: AC
  Administered 2013-09-25: 4 mg via INTRAVENOUS
  Filled 2013-09-25: qty 2

## 2013-09-25 NOTE — ED Provider Notes (Signed)
CSN: [id number]     Arrival date & time 09/25/13  1341 History   First MD Initiated Contact with Patient 09/25/13 1504     Chief Complaint  Patient presents with  . Abdominal Pain     (Consider location/radiation/quality/duration/timing/severity/associated sxs/prior Treatment) HPI.... upper abdominal pain with radiation to the back for several days with associated diarrhea and nausea. No vomiting, fever, chills, dysuria, vaginal discharge, vaginal bleeding.  LMP February 19. Nontender in right lower quadrant. Severity is moderate. Nothing makes symptoms better or worse.  Past Medical History  Diagnosis Date  . GERD (gastroesophageal reflux disease)   . Bipolar 1 disorder   . Depression   . Headache(784.0)   . Urinary tract infection   . Kidney stones   . Carpal tunnel syndrome on both sides     with preg  . Bipolar disorder   . Abnormal Pap smear   . Vaginal discharge 03/11/2013  . RLQ abdominal pain 03/11/2013  . BV (bacterial vaginosis) 03/11/2013  . Ovarian cyst, right   . Obesity 06/17/2013   Past Surgical History  Procedure Laterality Date  . Tonsillectomy    . Myringotomy     Family History  Problem Relation Age of Onset  . Other Neg Hx   . Hypertension Mother   . Hypertension Father   . Hypertension Maternal Aunt   . Hypertension Maternal Uncle   . Hypertension Paternal Aunt   . Hypertension Paternal Uncle   . Diabetes Maternal Grandfather    History  Substance Use Topics  . Smoking status: Light Tobacco Smoker -- 0.25 packs/day for 1 years    Types: Cigarettes    Last Attempt to Quit: 04/19/2012  . Smokeless tobacco: Never Used  . Alcohol Use: No   OB History   Grav Para Term Preterm Abortions TAB SAB Ect Mult Living   1 1 1  0 0 0 0 0 0 1     Review of Systems  All other systems reviewed and are negative.      Allergies  Review of patient's allergies indicates no known allergies.  Home Medications   Current Outpatient Rx  Name  Route  Sig   Dispense  Refill  . doxycycline (MONODOX) 100 MG capsule   Oral   Take 100 mg by mouth 2 (two) times daily.         Marland Kitchen ibuprofen (ADVIL,MOTRIN) 800 MG tablet   Oral   Take 800 mg by mouth 3 (three) times daily as needed. pain         . omeprazole (PRILOSEC) 20 MG capsule   Oral   Take 20 mg by mouth daily.         . Oxcarbazepine (TRILEPTAL) 300 MG tablet   Oral   Take 300 mg by mouth 2 (two) times daily.         Marland Kitchen PARoxetine (PAXIL) 20 MG tablet   Oral   Take 20 mg by mouth daily.         . risperiDONE (RISPERDAL) 1 MG tablet   Oral   Take 1 mg by mouth at bedtime.         Marland Kitchen HYDROcodone-acetaminophen (NORCO) 5-325 MG per tablet   Oral   Take 2 tablets by mouth every 4 (four) hours as needed.   15 tablet   0   . promethazine (PHENERGAN) 25 MG tablet   Oral   Take 1 tablet (25 mg total) by mouth every 6 (six) hours as needed.  15 tablet   0    BP 112/56  Pulse 98  Temp(Src) 100.1 F (37.8 C) (Oral)  Resp 10  Ht 5' (1.524 m)  Wt 184 lb (83.462 kg)  BMI 35.94 kg/m2  SpO2 98%  LMP 08/19/2013 Physical Exam  Nursing note and vitals reviewed. Constitutional: She is oriented to person, place, and time. She appears well-developed and well-nourished.  HENT:  Head: Normocephalic and atraumatic.  Eyes: Conjunctivae and EOM are normal. Pupils are equal, round, and reactive to light.  Neck: Normal range of motion. Neck supple.  Cardiovascular: Normal rate, regular rhythm and normal heart sounds.   Pulmonary/Chest: Effort normal and breath sounds normal.  Abdominal: Soft. Bowel sounds are normal.  Minimal tenderness bilateral upper abdomen  Musculoskeletal: Normal range of motion.  Neurological: She is alert and oriented to person, place, and time.  Skin: Skin is warm and dry.  Psychiatric: She has a normal mood and affect. Her behavior is normal.    ED Course  Procedures (including critical care time) Labs Review Labs Reviewed  COMPREHENSIVE  METABOLIC PANEL - Abnormal; Notable for the following:    Glucose, Bld 104 (*)    BUN 4 (*)    All other components within normal limits  URINALYSIS, ROUTINE W REFLEX MICROSCOPIC  PREGNANCY, URINE  CBC WITH DIFFERENTIAL  LIPASE, BLOOD   Imaging Review No results found.   EKG Interpretation None      MDM   Final diagnoses:  Gastroenteritis    No acute abdomen. White count normal. Slight fever noted. Patient feels better after IV fluids. Discharge medications Norco and Phenergan 25 mg per    Donnetta HutchingBrian Makayah Pauli, MD 09/25/13 [phone]

## 2013-09-25 NOTE — ED Notes (Signed)
Pt c/o cold symptoms with cough that started two weeks ago, diarrhea and abd, lower back pain that started a week ago, pt is also concerned because she has missed this month's  cycle and has discharge from nipple.

## 2013-09-25 NOTE — Discharge Instructions (Signed)
Increase fluids. Medication for nausea and pain. Followup your primary care Dr.

## 2014-01-06 ENCOUNTER — Ambulatory Visit (INDEPENDENT_AMBULATORY_CARE_PROVIDER_SITE_OTHER): Payer: Medicare Other | Admitting: Adult Health

## 2014-01-06 ENCOUNTER — Encounter: Payer: Self-pay | Admitting: Adult Health

## 2014-01-06 VITALS — BP 100/82 | Ht 60.0 in | Wt 184.5 lb

## 2014-01-06 DIAGNOSIS — N949 Unspecified condition associated with female genital organs and menstrual cycle: Secondary | ICD-10-CM

## 2014-01-06 DIAGNOSIS — N898 Other specified noninflammatory disorders of vagina: Secondary | ICD-10-CM

## 2014-01-06 DIAGNOSIS — N760 Acute vaginitis: Secondary | ICD-10-CM

## 2014-01-06 HISTORY — DX: Unspecified condition associated with female genital organs and menstrual cycle: N94.9

## 2014-01-06 MED ORDER — IBUPROFEN 800 MG PO TABS: 800.0000 mg | ORAL_TABLET | Freq: Three times a day (TID) | ORAL | Status: DC | PRN

## 2014-01-06 NOTE — Progress Notes (Signed)
Subjective:     Patient ID: Connie [name], female   DOB: [birth year], 23 y.o.   MRN: [id number]  HPI Connie [name] is a 23 year old white female, single in complaining of pain in low pelvic region, more to the right, was [facility] ago and treated for UTI with bactrum and Vicodin, which caused chest pain and itching, so she stopped.Has had some diarrhea, denies nausea,vomiting or fever.No new sex partners,last sex 1 month ago.Uses condoms.  Review of Systems See HPI Reviewed past medical,surgical, social and family history. Reviewed medications and allergies.     Objective:   Physical Exam BP 100/82  Ht 5' (1.524 m)  Wt 184 lb 8 oz (83.689 kg)  BMI 36.03 kg/m2  LMP 12/26/2013  Breastfeeding? No   Skin warm and dry.Pelvic: external genitalia is normal in appearance, vagina: scant discharge without odor, cervix:smooth and bulbous, friable with Q tip,negative CMT , uterus: normal size, shape and contour, non tender, no masses felt, adnexa: no masses, some RLQ  tenderness noted and tenderness over bladder.No CVAT. GC/CHL obtained.   Assessment:     Pelvic pain  Vaginal discharge    Plan:    No sex til GC/CHL back Refilled motrin 800 mg 1 every 8 hours prn with 1 refill Push fluids Return in 1 week for US Review handout on pelvic pain

## 2014-01-06 NOTE — Patient Instructions (Signed)
Return in 1 week for US and see me Pelvic Pain, Female Female pelvic pain can be caused by many different things and start from a variety of places. Pelvic pain refers to pain that is located in the lower half of the abdomen and between your hips. The pain may occur over a short period of time (acute) or may be reoccurring (chronic). The cause of pelvic pain may be related to disorders affecting the female reproductive organs (gynecologic), but it may also be related to the bladder, kidney stones, an intestinal complication, or muscle or skeletal problems. Getting help right away for pelvic pain is important, especially if there has been severe, sharp, or a sudden onset of unusual pain. It is also important to get help right away because some types of pelvic pain can be life threatening.  CAUSES  Below are only some of the causes of pelvic pain. The causes of pelvic pain can be in one of several categories.   Gynecologic.  Pelvic inflammatory disease.  Sexually transmitted infection.  Ovarian cyst or a twisted ovarian ligament (ovarian torsion).  Uterine lining that grows outside the uterus (endometriosis).  Fibroids, cysts, or tumors.  Ovulation.  Pregnancy.  Pregnancy that occurs outside the uterus (ectopic pregnancy).  Miscarriage.  Labor.  Abruption of the placenta or ruptured uterus.  Infection.  Uterine infection (endometritis).  Bladder infection.  Diverticulitis.  Miscarriage related to a uterine infection (septic abortion).  Bladder.  Inflammation of the bladder (cystitis).  Kidney stone(s).  Gastrointenstinal.  Constipation.  Diverticulitis.  Neurologic.  Trauma.  Feeling pelvic pain because of mental or emotional causes (psychosomatic).  Cancers of the bowel or pelvis. EVALUATION  Your caregiver will want to take a careful history of your concerns. This includes recent changes in your health, a careful gynecologic history of your periods  (menses), and a sexual history. Obtaining your family history and medical history is also important. Your caregiver may suggest a pelvic exam. A pelvic exam will help identify the location and severity of the pain. It also helps in the evaluation of which organ system may be involved. In order to identify the cause of the pelvic pain and be properly treated, your caregiver may order tests. These tests may include:   A pregnancy test.  Pelvic ultrasonography.  An X-ray exam of the abdomen.  A urinalysis or evaluation of vaginal discharge.  Blood tests. HOME CARE INSTRUCTIONS   Only take over-the-counter or prescription medicines for pain, discomfort, or fever as directed by your caregiver.   Rest as directed by your caregiver.   Eat a balanced diet.   Drink enough fluids to make your urine clear or pale yellow, or as directed.   Avoid sexual intercourse if it causes pain.   Apply warm or cold compresses to the lower abdomen depending on which one helps the pain.   Avoid stressful situations.   Keep a journal of your pelvic pain. Write down when it started, where the pain is located, and if there are things that seem to be associated with the pain, such as food or your menstrual cycle.  Follow up with your caregiver as directed.  SEEK MEDICAL CARE IF:  Your medicine does not help your pain.  You have abnormal vaginal discharge. SEEK IMMEDIATE MEDICAL CARE IF:   You have heavy bleeding from the vagina.   Your pelvic pain increases.   You feel lightheaded or faint.   You have chills.   You have pain with urination or  blood in your urine.   You have uncontrolled diarrhea or vomiting.   You have a fever or persistent symptoms for more than 3 days.  You have a fever and your symptoms suddenly get worse.   You are being physically or sexually abused.  MAKE SURE YOU:  Understand these instructions.  Will watch your condition.  Will get help if you  are not doing well or get worse. Document Released: 05/14/2004 Document Revised: 12/17/2011 Document Reviewed: 10/07/2011 [facility] Patient Information 2015 [place], [place]. This information is not intended to replace advice given to you by your health care provider. Make sure you discuss any questions you have with your health care provider.

## 2014-01-07 LAB — GC/CHLAMYDIA PROBE AMP
CT Probe RNA: NEGATIVE
GC Probe RNA: NEGATIVE

## 2014-01-10 ENCOUNTER — Encounter (HOSPITAL_COMMUNITY): Payer: Self-pay | Admitting: Emergency Medicine

## 2014-01-10 ENCOUNTER — Telehealth: Payer: Self-pay | Admitting: Adult Health

## 2014-01-10 ENCOUNTER — Emergency Department (HOSPITAL_COMMUNITY)
Admission: EM | Admit: 2014-01-10 | Discharge: 2014-01-10 | Discharge status: Against Medical Advice | Payer: Medicare Other | Attending: Emergency Medicine | Admitting: Emergency Medicine

## 2014-01-10 DIAGNOSIS — R197 Diarrhea, unspecified: Secondary | ICD-10-CM | POA: Insufficient documentation

## 2014-01-10 DIAGNOSIS — R1110 Vomiting, unspecified: Secondary | ICD-10-CM | POA: Insufficient documentation

## 2014-01-10 NOTE — ED Notes (Signed)
Per Jonny RuizJohn in registration, pt left after Triage before being seen by MD

## 2014-01-10 NOTE — ED Notes (Signed)
Vomiting and diarrhea x 2 days

## 2014-01-10 NOTE — Telephone Encounter (Signed)
Spoke with pt letting her know GC/CHL was negative. JSY 

## 2014-01-12 ENCOUNTER — Ambulatory Visit: Payer: Medicare Other | Admitting: Adult Health

## 2014-01-12 ENCOUNTER — Other Ambulatory Visit: Payer: Medicare Other

## 2014-01-16 ENCOUNTER — Emergency Department (HOSPITAL_COMMUNITY): Payer: Medicare Other

## 2014-01-16 ENCOUNTER — Encounter (HOSPITAL_COMMUNITY): Payer: Self-pay | Admitting: Emergency Medicine

## 2014-01-16 ENCOUNTER — Emergency Department (HOSPITAL_COMMUNITY)
Admission: EM | Admit: 2014-01-16 | Discharge: 2014-01-17 | Disposition: A | Discharge status: Home / Self Care | Payer: Medicare Other | Attending: Emergency Medicine | Admitting: Emergency Medicine

## 2014-01-16 ENCOUNTER — Emergency Department (HOSPITAL_COMMUNITY)
Admission: EM | Admit: 2014-01-16 | Discharge: 2014-01-16 | Discharge status: Against Medical Advice | Payer: Medicare Other | Source: Home / Self Care | Attending: Emergency Medicine | Admitting: Emergency Medicine

## 2014-01-16 DIAGNOSIS — Z87442 Personal history of urinary calculi: Secondary | ICD-10-CM | POA: Insufficient documentation

## 2014-01-16 DIAGNOSIS — Z79899 Other long term (current) drug therapy: Secondary | ICD-10-CM | POA: Insufficient documentation

## 2014-01-16 DIAGNOSIS — Z792 Long term (current) use of antibiotics: Secondary | ICD-10-CM | POA: Insufficient documentation

## 2014-01-16 DIAGNOSIS — Z8739 Personal history of other diseases of the musculoskeletal system and connective tissue: Secondary | ICD-10-CM | POA: Insufficient documentation

## 2014-01-16 DIAGNOSIS — Z3202 Encounter for pregnancy test, result negative: Secondary | ICD-10-CM | POA: Diagnosis not present

## 2014-01-16 DIAGNOSIS — R1012 Left upper quadrant pain: Secondary | ICD-10-CM

## 2014-01-16 DIAGNOSIS — N390 Urinary tract infection, site not specified: Secondary | ICD-10-CM | POA: Diagnosis not present

## 2014-01-16 DIAGNOSIS — E669 Obesity, unspecified: Secondary | ICD-10-CM | POA: Insufficient documentation

## 2014-01-16 DIAGNOSIS — Z8619 Personal history of other infectious and parasitic diseases: Secondary | ICD-10-CM | POA: Diagnosis not present

## 2014-01-16 DIAGNOSIS — F17200 Nicotine dependence, unspecified, uncomplicated: Secondary | ICD-10-CM | POA: Insufficient documentation

## 2014-01-16 DIAGNOSIS — K219 Gastro-esophageal reflux disease without esophagitis: Secondary | ICD-10-CM | POA: Diagnosis not present

## 2014-01-16 DIAGNOSIS — R109 Unspecified abdominal pain: Secondary | ICD-10-CM

## 2014-01-16 DIAGNOSIS — F319 Bipolar disorder, unspecified: Secondary | ICD-10-CM | POA: Diagnosis not present

## 2014-01-16 DIAGNOSIS — Z8742 Personal history of other diseases of the female genital tract: Secondary | ICD-10-CM | POA: Insufficient documentation

## 2014-01-16 DIAGNOSIS — R112 Nausea with vomiting, unspecified: Secondary | ICD-10-CM | POA: Diagnosis not present

## 2014-01-16 DIAGNOSIS — Z791 Long term (current) use of non-steroidal anti-inflammatories (NSAID): Secondary | ICD-10-CM | POA: Insufficient documentation

## 2014-01-16 LAB — CBC WITH DIFFERENTIAL/PLATELET
Basophils Absolute: 0 10*3/uL (ref 0.0–0.1)
Basophils Relative: 1 % (ref 0–1)
Eosinophils Absolute: 0.1 10*3/uL (ref 0.0–0.7)
Eosinophils Relative: 2 % (ref 0–5)
HCT: 39.9 % (ref 36.0–46.0)
Hemoglobin: 13.4 g/dL (ref 12.0–15.0)
Lymphocytes Relative: 34 % (ref 12–46)
Lymphs Abs: 1.9 10*3/uL (ref 0.7–4.0)
MCH: 30.4 pg (ref 26.0–34.0)
MCHC: 33.6 g/dL (ref 30.0–36.0)
MCV: 90.5 fL (ref 78.0–100.0)
Monocytes Absolute: 0.4 10*3/uL (ref 0.1–1.0)
Monocytes Relative: 8 % (ref 3–12)
Neutro Abs: 3 10*3/uL (ref 1.7–7.7)
Neutrophils Relative %: 55 % (ref 43–77)
Platelets: 265 10*3/uL (ref 150–400)
RBC: 4.41 MIL/uL (ref 3.87–5.11)
RDW: 14 % (ref 11.5–15.5)
WBC: 5.4 10*3/uL (ref 4.0–10.5)

## 2014-01-16 LAB — COMPREHENSIVE METABOLIC PANEL
ALT: 11 U/L (ref 0–35)
AST: 16 U/L (ref 0–37)
Albumin: 4.1 g/dL (ref 3.5–5.2)
Alkaline Phosphatase: 68 U/L (ref 39–117)
Anion gap: 11 (ref 5–15)
BUN: 5 mg/dL — ABNORMAL LOW (ref 6–23)
CO2: 27 mEq/L (ref 19–32)
Calcium: 9.5 mg/dL (ref 8.4–10.5)
Chloride: 100 mEq/L (ref 96–112)
Creatinine, Ser: 0.99 mg/dL (ref 0.50–1.10)
GFR calc Af Amer: >90 mL/min (ref >90)
GFR calc non Af Amer: 79 mL/min — ABNORMAL LOW (ref >90)
Glucose, Bld: 97 mg/dL (ref 70–99)
Potassium: 3.9 mEq/L (ref 3.7–5.3)
Sodium: 138 mEq/L (ref 137–147)
Total Bilirubin: 0.2 mg/dL — ABNORMAL LOW (ref 0.3–1.2)
Total Protein: 7 g/dL (ref 6.0–8.3)

## 2014-01-16 LAB — URINALYSIS, ROUTINE W REFLEX MICROSCOPIC
Bilirubin Urine: NEGATIVE
Glucose, UA: NEGATIVE mg/dL
Hgb urine dipstick: NEGATIVE
Nitrite: NEGATIVE
Specific Gravity, Urine: 1.02 (ref 1.005–1.030)
Urobilinogen, UA: 0.2 mg/dL (ref 0.0–1.0)
pH: 7 (ref 5.0–8.0)

## 2014-01-16 LAB — PREGNANCY, URINE: Preg Test, Ur: NEGATIVE

## 2014-01-16 LAB — LIPASE, BLOOD: Lipase: 14 U/L (ref 11–59)

## 2014-01-16 LAB — URINE MICROSCOPIC-ADD ON

## 2014-01-16 MED ORDER — OXYCODONE-ACETAMINOPHEN 5-325 MG PO TABS: 1.0000 | ORAL_TABLET | ORAL | Status: DC | PRN

## 2014-01-16 MED ORDER — SODIUM CHLORIDE 0.9 % IV BOLUS (SEPSIS)
1000.0000 mL | Freq: Once | INTRAVENOUS | Status: AC
  Administered 2014-01-16: 1000 mL via INTRAVENOUS

## 2014-01-16 MED ORDER — SODIUM CHLORIDE 0.9 % IV SOLN
INTRAVENOUS | Status: DC
  Administered 2014-01-16: 23:00:00 via INTRAVENOUS

## 2014-01-16 MED ORDER — HYDROMORPHONE HCL PF 1 MG/ML IJ SOLN
1.0000 mg | Freq: Once | INTRAMUSCULAR | Status: AC
Start: 2014-01-16 — End: 2014-01-16
  Administered 2014-01-16: 1 mg via INTRAVENOUS
  Filled 2014-01-16: qty 1

## 2014-01-16 MED ORDER — ONDANSETRON HCL 4 MG/2ML IJ SOLN
4.0000 mg | Freq: Once | INTRAMUSCULAR | Status: AC
  Administered 2014-01-16: 4 mg via INTRAVENOUS
  Filled 2014-01-16: qty 2

## 2014-01-16 MED ORDER — CEPHALEXIN 500 MG PO CAPS: 500.0000 mg | ORAL_CAPSULE | Freq: Four times a day (QID) | ORAL | Status: DC

## 2014-01-16 MED ORDER — ONDANSETRON HCL 4 MG/2ML IJ SOLN
4.0000 mg | Freq: Once | INTRAMUSCULAR | Status: DC
  Filled 2014-01-16: qty 2

## 2014-01-16 MED ORDER — IOHEXOL 300 MG/ML  SOLN
50.0000 mL | Freq: Once | INTRAMUSCULAR | Status: AC | PRN
  Administered 2014-01-16: 50 mL via ORAL

## 2014-01-16 MED ORDER — ONDANSETRON HCL 4 MG/2ML IJ SOLN
4.0000 mg | Freq: Once | INTRAMUSCULAR | Status: AC
  Administered 2014-01-16: 4 mg via INTRAVENOUS

## 2014-01-16 MED ORDER — IOHEXOL 300 MG/ML  SOLN
100.0000 mL | Freq: Once | INTRAMUSCULAR | Status: AC | PRN
  Administered 2014-01-16: 100 mL via INTRAVENOUS

## 2014-01-16 MED ORDER — CEPHALEXIN 500 MG PO CAPS
500.0000 mg | ORAL_CAPSULE | Freq: Once | ORAL | Status: AC
  Administered 2014-01-16: 500 mg via ORAL
  Filled 2014-01-16: qty 1

## 2014-01-16 NOTE — Discharge Instructions (Signed)
Abdominal Pain °Many things can cause abdominal pain. Usually, abdominal pain is not caused by a disease and will improve without treatment. It can often be observed and treated at home. Your health care provider will do a physical exam and possibly order blood tests and X-rays to help determine the seriousness of your pain. However, in many cases, more time must pass before a clear cause of the pain can be found. Before that point, your health care provider may not know if you need more testing or further treatment. °HOME CARE INSTRUCTIONS  °Monitor your abdominal pain for any changes. The following actions may help to alleviate any discomfort you are experiencing: °· Only take over-the-counter or prescription medicines as directed by your health care provider. °· Do not take laxatives unless directed to do so by your health care provider. °· Try a clear liquid diet (broth, tea, or water) as directed by your health care provider. Slowly move to a bland diet as tolerated. °SEEK MEDICAL CARE IF: °· You have unexplained abdominal pain. °· You have abdominal pain associated with nausea or diarrhea. °· You have pain when you urinate or have a bowel movement. °· You experience abdominal pain that wakes you in the night. °· You have abdominal pain that is worsened or improved by eating food. °· You have abdominal pain that is worsened with eating fatty foods. °· You have a fever. °SEEK IMMEDIATE MEDICAL CARE IF:  °· Your pain does not go away within 2 hours. °· You keep throwing up (vomiting). °· Your pain is felt only in portions of the abdomen, such as the right side or the left lower portion of the abdomen. °· You pass bloody or black tarry stools. °MAKE SURE YOU: °· Understand these instructions.   °· Will watch your condition.   °· Will get help right away if you are not doing well or get worse.   °Document Released: 03/27/2005 Document Revised: 06/22/2013 Document Reviewed: 02/24/2013 °[employer]® Patient Information  ©2015 [employer]. This information is not intended to replace advice given to you by your health care provider. Make sure you discuss any questions you have with your health care provider. °Urinary Tract Infection °Urinary tract infections (UTIs) can develop anywhere along your urinary tract. Your urinary tract is your body's drainage system for removing wastes and extra water. Your urinary tract includes two kidneys, two ureters, a bladder, and a urethra. Your kidneys are a pair of bean-shaped organs. Each kidney is about the size of your fist. They are located below your ribs, one on each side of your spine. °CAUSES °Infections are caused by microbes, which are microscopic organisms, including fungi, viruses, and bacteria. These organisms are so small that they can only be seen through a microscope. Bacteria are the microbes that most commonly cause UTIs. °SYMPTOMS  °Symptoms of UTIs may vary by age and gender of the patient and by the location of the infection. Symptoms in young women typically include a frequent and intense urge to urinate and a painful, burning feeling in the bladder or urethra during urination. Older women and men are more likely to be tired, shaky, and weak and have muscle aches and abdominal pain. A fever may mean the infection is in your kidneys. Other symptoms of a kidney infection include pain in your back or sides below the ribs, nausea, and vomiting. °DIAGNOSIS °To diagnose a UTI, your caregiver will ask you about your symptoms. Your caregiver also will ask to provide a urine sample. The urine sample   will be tested for bacteria and white blood cells. White blood cells are made by your body to help fight infection. °TREATMENT  °Typically, UTIs can be treated with medication. Because most UTIs are caused by a bacterial infection, they usually can be treated with the use of antibiotics. The choice of antibiotic and length of treatment depend on your symptoms and the type of bacteria  causing your infection. °HOME CARE INSTRUCTIONS °· If you were prescribed antibiotics, take them exactly as your caregiver instructs you. Finish the medication even if you feel better after you have only taken some of the medication. °· Drink enough water and fluids to keep your urine clear or pale yellow. °· Avoid caffeine, tea, and carbonated beverages. They tend to irritate your bladder. °· Empty your bladder often. Avoid holding urine for long periods of time. °· Empty your bladder before and after sexual intercourse. °· After a bowel movement, women should cleanse from front to back. Use each tissue only once. °SEEK MEDICAL CARE IF:  °· You have back pain. °· You develop a fever. °· Your symptoms do not begin to resolve within 3 days. °SEEK IMMEDIATE MEDICAL CARE IF:  °· You have severe back pain or lower abdominal pain. °· You develop chills. °· You have nausea or vomiting. °· You have continued burning or discomfort with urination. °MAKE SURE YOU:  °· Understand these instructions. °· Will watch your condition. °· Will get help right away if you are not doing well or get worse. °Document Released: 03/27/2005 Document Revised: 12/17/2011 Document Reviewed: 07/26/2011 °[employer]® Patient Information ©2015 [employer]. This information is not intended to replace advice given to you by your health care provider. Make sure you discuss any questions you have with your health care provider. ° °

## 2014-01-16 NOTE — ED Notes (Signed)
Pt has c/o of sharp abdominal pain, N &V, x 1 day.

## 2014-01-16 NOTE — ED Provider Notes (Signed)
CSN: [id number]     Arrival date & time 01/16/14  2011 History  This chart was scribed for Flint Melter, MD by Chestine Spore, ED Scribe. The patient was seen in room [facility] at 8:30 PM.     Chief Complaint  Patient presents with  . Abdominal Pain      The history is provided by the patient. No language interpreter was used.   HPI Comments: ANAGABRIELA JOKERST is a 23 y.o. female who presents to the Emergency Department complaining of coming and going sharp abdominal pain onset 6 days. She states that she can not lift anything because of the pain. She states that she did not see anyone for the issues. She states that she has been having abdominal pain for a year now. She states that she has associated symptoms of nausea, vomiting, and back pain. She states that she has seen her PCP and they instructed her to see a Solicitor.    She states that she has taken 800 mg Motrin with no relief for her symptoms. She states that she has not had much to eat lately. She states that she takes daily medications for her Bipolar disorder. She denies diarrhea, fever, cough, CP, dizziness, dysuria, vaginal discharge, or vaginal bleeding. She was a pt at the [facility] 6 days ago, but she left before being seen by the attending Physician.   She was seen by Gynecologist provider on 01/06/14 for STD testing that was negative. She is scheduled for a pelvic Ultrasound on 01/26/14.    LMP- Patient's last menstrual period was 12/26/2013.   Past Medical History  Diagnosis Date  . GERD (gastroesophageal reflux disease)   . Bipolar 1 disorder   . Depression   . Headache(784.0)   . Urinary tract infection   . Kidney stones   . Carpal tunnel syndrome on both sides     with preg  . Bipolar disorder   . Abnormal Pap smear   . Vaginal discharge 03/11/2013  . RLQ abdominal pain 03/11/2013  . BV (bacterial vaginosis) 03/11/2013  . Ovarian cyst, right   . Obesity 06/17/2013  . Unspecified symptom associated  with female genital organs 01/06/2014   Past Surgical History  Procedure Laterality Date  . Tonsillectomy    . Myringotomy     Family History  Problem Relation Age of Onset  . Hypertension Mother   . Hypertension Father   . Hypertension Maternal Aunt   . Hypertension Maternal Uncle   . Hypertension Paternal Aunt   . Hypertension Paternal Uncle   . Diabetes Maternal Grandfather   . Diabetes Paternal Grandmother   . Other Son     swallowing problems   History  Substance Use Topics  . Smoking status: Light Tobacco Smoker -- 0.25 packs/day for 1 years    Types: Cigarettes    Last Attempt to Quit: 04/19/2012  . Smokeless tobacco: Never Used  . Alcohol Use: No   OB History   Grav Para Term Preterm Abortions TAB SAB Ect Mult Living   1 1 1  0 0 0 0 0 0 1     Review of Systems  Constitutional: Negative for fever.  Respiratory: Negative for cough.   Cardiovascular: Negative for chest pain.  Gastrointestinal: Positive for nausea, vomiting and abdominal pain. Negative for diarrhea.  Genitourinary: Negative for dysuria, vaginal bleeding and vaginal discharge.  Musculoskeletal: Positive for back pain.  Neurological: Negative for dizziness.  All other systems reviewed and are negative.  Allergies  Hydrocodone  Home Medications   Prior to Admission medications   Medication Sig Start Date End Date Taking? Authorizing Provider  haloperidol (HALDOL) 1 MG tablet Take 1 mg by mouth at bedtime.  01/04/14  Yes Historical Provider, MD  ibuprofen (ADVIL,MOTRIN) 800 MG tablet Take 1 tablet (800 mg total) by mouth 3 (three) times daily as needed. pain 01/06/14  Yes Adline Potter, NP  omeprazole (PRILOSEC) 20 MG capsule Take 20 mg by mouth daily.   Yes Historical Provider, MD  Oxcarbazepine (TRILEPTAL) 300 MG tablet Take 300 mg by mouth 2 (two) times daily.   Yes Historical Provider, MD  PARoxetine (PAXIL) 20 MG tablet Take 20 mg by mouth daily. 09/02/13  Yes Historical Provider, MD   sulfamethoxazole-trimethoprim (BACTRIM DS) 800-160 MG per tablet Take 1 tablet by mouth daily.  01/04/14  Yes Historical Provider, MD  promethazine (PHENERGAN) 25 MG tablet Take 1 tablet (25 mg total) by mouth every 6 (six) hours as needed. 09/25/13   Donnetta Hutching, MD   LMP 12/26/2013  Physical Exam  Nursing note and vitals reviewed. Constitutional: She is oriented to person, place, and time. She appears well-developed and well-nourished.  HENT:  Head: Normocephalic and atraumatic.  Eyes: Conjunctivae and EOM are normal. Pupils are equal, round, and reactive to light.  Neck: Normal range of motion and phonation normal. Neck supple.  Cardiovascular: Normal rate, regular rhythm and intact distal pulses.   Pulmonary/Chest: Effort normal and breath sounds normal. She exhibits no tenderness.  Abdominal: Soft. She exhibits no distension and no mass. There is no tenderness. There is no rebound and no guarding.  Mild LUQ tenderness. No masses. No rebound tenderness.  Musculoskeletal: Normal range of motion.  Neurological: She is alert and oriented to person, place, and time. She exhibits normal muscle tone.  Skin: Skin is warm and dry.  Psychiatric: She has a normal mood and affect. Her behavior is normal. Judgment and thought content normal.    ED Course  Procedures (including critical care time) DIAGNOSTIC STUDIES: Oxygen Saturation is 100% on room air, normal by my interpretation.    COORDINATION OF CARE: 8:36 PM-Discussed treatment plan which includes labs with pt at bedside and pt agreed to plan.   Medications  0.9 %  sodium chloride infusion (not administered)  sodium chloride 0.9 % bolus 1,000 mL (1,000 mLs Intravenous New Bag/Given 01/16/14 2103)  HYDROmorphone (DILAUDID) injection 1 mg (1 mg Intravenous Given 01/16/14 2103)  ondansetron (ZOFRAN) injection 4 mg (4 mg Intravenous Given 01/16/14 2104)  iohexol (OMNIPAQUE) 300 MG/ML solution 50 mL (50 mLs Oral Contrast Given 01/16/14 2219)   iohexol (OMNIPAQUE) 300 MG/ML solution 100 mL (100 mLs Intravenous Contrast Given 01/16/14 2218)    Patient Vitals for the past 24 hrs:  BP Temp Temp src Pulse Resp SpO2  01/17/14 0004 114/85 mmHg 98.2 F (36.8 C) Oral 69 20 99 %     10:30 PM Reevaluation with update and discussion. After initial assessment and treatment, an updated evaluation reveals . She requests additional pain medication. I instructed her that her tests were reassuring in that there was no indication for strong analgesic medication. She will be supplied with oral narcotics to go home with. Findings discussed with patient, questions answered. Gray Bernhardt, MD.     Labs Review Labs Reviewed  COMPREHENSIVE METABOLIC PANEL - Abnormal; Notable for the following:    BUN 5 (*)    Total Bilirubin 0.2 (*)    GFR calc non Af Denyse Dago  79 (*)    All other components within normal limits  URINALYSIS, ROUTINE W REFLEX MICROSCOPIC - Abnormal; Notable for the following:    APPearance CLOUDY (*)    Ketones, ur TRACE (*)    Protein, ur TRACE (*)    Leukocytes, UA SMALL (*)    All other components within normal limits  URINE MICROSCOPIC-ADD ON - Abnormal; Notable for the following:    Squamous Epithelial / LPF MANY (*)    Bacteria, UA MANY (*)    All other components within normal limits  URINE CULTURE  CBC WITH DIFFERENTIAL  LIPASE, BLOOD  PREGNANCY, URINE    Imaging Review Ct Abdomen Pelvis W Contrast  01/16/2014   CLINICAL DATA:  Right-sided abdominal pain. History of kidney stones.  EXAM: CT ABDOMEN AND PELVIS WITH CONTRAST  TECHNIQUE: Multidetector CT imaging of the abdomen and pelvis was performed using the standard protocol following bolus administration of intravenous contrast.  CONTRAST:  100mL OMNIPAQUE IOHEXOL 300 MG/ML SOLN, 50mL OMNIPAQUE IOHEXOL 300 MG/ML SOLN  COMPARISON:  Abdominal pelvic CT 01/12/2008.  FINDINGS: Lung bases: Clear. No significant pleural or pericardial effusion.  Liver: Normal in density  without focal abnormality.  Gallbladder/Biliary system: No evidence of gallstones, biliary dilatation or gallbladder wall thickening.  Pancreas: Unremarkable.  Spleen: Unremarkable.  Adrenal glands: No adrenal mass.  Kidneys/Ureters/Bladder: No evidence of renal mass, urinary tract calculus or hydronephrosis. The bladder appears normal.  Bowel: The stomach, small bowel, appendix and colon appear normal. No inflammatory changes or wall thickening identified.  Peritoneum: No ascites or peritoneal nodularity.  Lymph Nodes: No enlarged abdominal pelvic lymph nodes. Small lymph nodes within the ileocolonic mesentery are improved from the prior study.  Vascular Structures: No significant vascular findings.  Reproductive Organs: The uterus and ovaries appear normal.  Abdominal wall: Small umbilical hernia containing only fat. No other abdominal wall abnormalities identified.  Musculoskeletal: No significant osseous findings.  IMPRESSION: 1. No acute findings or explanation for the patient's symptoms. 2. No evidence of appendicitis. 3. No evidence of urinary tract calculus or hydronephrosis.   Electronically Signed   By: Roxy HorsemanBill  Veazey M.D.   On: 01/16/2014 22:51     EKG Interpretation None      MDM   Final diagnoses:  Left upper quadrant pain  Urinary tract infection without hematuria, site unspecified   Nonspecific abdominal pain, with reassuring evaluation. Possible mild urinary tract infection, cultures pending.   Nursing Notes Reviewed/ Care Coordinated Applicable Imaging Reviewed Interpretation of Laboratory Data incorporated into ED treatment  The patient appears reasonably screened and/or stabilized for discharge and I doubt any other medical condition or other [facility] requiring further screening, evaluation, or treatment in the ED at this time prior to discharge.  Plan: Home Medications- Percocet prepack to go; Home Treatments- rest; return here if the recommended treatment, does not improve the  symptoms; Recommended follow up- PCP follow up in 1 or 2 days     I personally performed the services described in this documentation, which was scribed in my presence. The recorded information has been reviewed and is accurate.     Flint MelterElliott L Alisi Lupien, MD 01/17/14 [phone]

## 2014-01-17 DIAGNOSIS — R109 Unspecified abdominal pain: Secondary | ICD-10-CM | POA: Diagnosis not present

## 2014-01-17 MED FILL — Oxycodone w/ Acetaminophen Tab 5-325 MG: ORAL | Qty: 6 | Status: AC

## 2014-01-18 LAB — URINE CULTURE
Colony Count: NO GROWTH
Culture: NO GROWTH

## 2014-01-26 ENCOUNTER — Ambulatory Visit: Payer: Medicare Other | Admitting: Adult Health

## 2014-01-26 ENCOUNTER — Other Ambulatory Visit: Payer: Medicare Other

## 2014-02-14 ENCOUNTER — Encounter (HOSPITAL_COMMUNITY): Payer: Self-pay | Admitting: Emergency Medicine

## 2014-02-14 ENCOUNTER — Emergency Department (HOSPITAL_COMMUNITY)
Admission: EM | Admit: 2014-02-14 | Discharge: 2014-02-14 | Disposition: A | Discharge status: Home / Self Care | Payer: Medicare Other | Attending: Emergency Medicine | Admitting: Emergency Medicine

## 2014-02-14 ENCOUNTER — Emergency Department (HOSPITAL_COMMUNITY): Payer: Medicare Other

## 2014-02-14 DIAGNOSIS — Z79899 Other long term (current) drug therapy: Secondary | ICD-10-CM | POA: Insufficient documentation

## 2014-02-14 DIAGNOSIS — Z791 Long term (current) use of non-steroidal anti-inflammatories (NSAID): Secondary | ICD-10-CM | POA: Diagnosis not present

## 2014-02-14 DIAGNOSIS — F329 Major depressive disorder, single episode, unspecified: Secondary | ICD-10-CM | POA: Diagnosis not present

## 2014-02-14 DIAGNOSIS — N83202 Unspecified ovarian cyst, left side: Secondary | ICD-10-CM

## 2014-02-14 DIAGNOSIS — F17200 Nicotine dependence, unspecified, uncomplicated: Secondary | ICD-10-CM | POA: Insufficient documentation

## 2014-02-14 DIAGNOSIS — Z792 Long term (current) use of antibiotics: Secondary | ICD-10-CM | POA: Insufficient documentation

## 2014-02-14 DIAGNOSIS — N83209 Unspecified ovarian cyst, unspecified side: Secondary | ICD-10-CM | POA: Insufficient documentation

## 2014-02-14 DIAGNOSIS — Z3202 Encounter for pregnancy test, result negative: Secondary | ICD-10-CM | POA: Diagnosis not present

## 2014-02-14 DIAGNOSIS — Z8669 Personal history of other diseases of the nervous system and sense organs: Secondary | ICD-10-CM | POA: Diagnosis not present

## 2014-02-14 DIAGNOSIS — E669 Obesity, unspecified: Secondary | ICD-10-CM | POA: Insufficient documentation

## 2014-02-14 DIAGNOSIS — R112 Nausea with vomiting, unspecified: Secondary | ICD-10-CM | POA: Insufficient documentation

## 2014-02-14 DIAGNOSIS — R1032 Left lower quadrant pain: Secondary | ICD-10-CM | POA: Diagnosis present

## 2014-02-14 DIAGNOSIS — Z87442 Personal history of urinary calculi: Secondary | ICD-10-CM | POA: Insufficient documentation

## 2014-02-14 DIAGNOSIS — F319 Bipolar disorder, unspecified: Secondary | ICD-10-CM | POA: Diagnosis not present

## 2014-02-14 DIAGNOSIS — Z87440 Personal history of urinary (tract) infections: Secondary | ICD-10-CM | POA: Diagnosis not present

## 2014-02-14 DIAGNOSIS — K219 Gastro-esophageal reflux disease without esophagitis: Secondary | ICD-10-CM | POA: Insufficient documentation

## 2014-02-14 DIAGNOSIS — Z8619 Personal history of other infectious and parasitic diseases: Secondary | ICD-10-CM | POA: Diagnosis not present

## 2014-02-14 DIAGNOSIS — F3289 Other specified depressive episodes: Secondary | ICD-10-CM | POA: Diagnosis not present

## 2014-02-14 LAB — CBC WITH DIFFERENTIAL/PLATELET
Basophils Absolute: 0 10*3/uL (ref 0.0–0.1)
Basophils Relative: 1 % (ref 0–1)
Eosinophils Absolute: 0.3 10*3/uL (ref 0.0–0.7)
Eosinophils Relative: 5 % (ref 0–5)
HCT: 39.6 % (ref 36.0–46.0)
Hemoglobin: 13.2 g/dL (ref 12.0–15.0)
Lymphocytes Relative: 32 % (ref 12–46)
Lymphs Abs: 2.1 10*3/uL (ref 0.7–4.0)
MCH: 31.1 pg (ref 26.0–34.0)
MCHC: 33.3 g/dL (ref 30.0–36.0)
MCV: 93.4 fL (ref 78.0–100.0)
Monocytes Absolute: 0.5 10*3/uL (ref 0.1–1.0)
Monocytes Relative: 7 % (ref 3–12)
Neutro Abs: 3.6 10*3/uL (ref 1.7–7.7)
Neutrophils Relative %: 55 % (ref 43–77)
Platelets: 300 10*3/uL (ref 150–400)
RBC: 4.24 MIL/uL (ref 3.87–5.11)
RDW: 14.1 % (ref 11.5–15.5)
WBC: 6.6 10*3/uL (ref 4.0–10.5)

## 2014-02-14 LAB — URINE MICROSCOPIC-ADD ON

## 2014-02-14 LAB — URINALYSIS, ROUTINE W REFLEX MICROSCOPIC
Bilirubin Urine: NEGATIVE
Glucose, UA: NEGATIVE mg/dL
Hgb urine dipstick: NEGATIVE
Ketones, ur: NEGATIVE mg/dL
Nitrite: NEGATIVE
Specific Gravity, Urine: 1.025 (ref 1.005–1.030)
Urobilinogen, UA: 0.2 mg/dL (ref 0.0–1.0)
pH: 6 (ref 5.0–8.0)

## 2014-02-14 LAB — COMPREHENSIVE METABOLIC PANEL
ALT: 10 U/L (ref 0–35)
AST: 15 U/L (ref 0–37)
Albumin: 4.1 g/dL (ref 3.5–5.2)
Alkaline Phosphatase: 64 U/L (ref 39–117)
Anion gap: 12 (ref 5–15)
BUN: 8 mg/dL (ref 6–23)
CO2: 28 mEq/L (ref 19–32)
Calcium: 9 mg/dL (ref 8.4–10.5)
Chloride: 101 mEq/L (ref 96–112)
Creatinine, Ser: 0.8 mg/dL (ref 0.50–1.10)
GFR calc Af Amer: >90 mL/min (ref >90)
GFR calc non Af Amer: >90 mL/min (ref >90)
Glucose, Bld: 107 mg/dL — ABNORMAL HIGH (ref 70–99)
Potassium: 4.3 mEq/L (ref 3.7–5.3)
Sodium: 141 mEq/L (ref 137–147)
Total Bilirubin: 0.2 mg/dL — ABNORMAL LOW (ref 0.3–1.2)
Total Protein: 7 g/dL (ref 6.0–8.3)

## 2014-02-14 LAB — PREGNANCY, URINE: Preg Test, Ur: NEGATIVE

## 2014-02-14 MED ORDER — OXYCODONE-ACETAMINOPHEN 5-325 MG PO TABS: 1.0000 | ORAL_TABLET | Freq: Four times a day (QID) | ORAL | Status: DC | PRN

## 2014-02-14 MED ORDER — HYDROMORPHONE HCL PF 1 MG/ML IJ SOLN
1.0000 mg | Freq: Once | INTRAMUSCULAR | Status: AC
  Administered 2014-02-14: 1 mg via INTRAVENOUS
  Filled 2014-02-14: qty 1

## 2014-02-14 MED ORDER — ONDANSETRON HCL 4 MG/2ML IJ SOLN
4.0000 mg | Freq: Once | INTRAMUSCULAR | Status: AC
  Administered 2014-02-14: 4 mg via INTRAVENOUS
  Filled 2014-02-14: qty 2

## 2014-02-14 MED ORDER — ONDANSETRON HCL 4 MG PO TABS: 4.0000 mg | ORAL_TABLET | Freq: Four times a day (QID) | ORAL | Status: DC

## 2014-02-14 NOTE — ED Notes (Signed)
Requesting more pain medication. Ambulatory to restroom, steady gait. No distress. MD made aware and verbal order to repeat previous pain medication.

## 2014-02-14 NOTE — ED Notes (Signed)
Patient to US

## 2014-02-14 NOTE — ED Notes (Signed)
Pt c/op n/v and abdominal pain x 2 hours; pt states the pain is located in suprapubic area

## 2014-02-14 NOTE — ED Notes (Signed)
Sprite given for PO fluid challenge 

## 2014-02-14 NOTE — ED Notes (Signed)
Patient with no vomiting post PO intake.

## 2014-02-14 NOTE — ED Provider Notes (Signed)
Signed out by Dr. Estell HarpinZammit.   Patient presents with abdominal pain medicine the suprapubic region. Disposition pending imaging. Ultrasound shows a left hemorrhagic ovarian cyst which is likely the cause of the patient's patient has been able to tolerate fluids and pain is now controlled. Will discharge with pain medication and OB followup.  Results for orders placed during the hospital encounter of 02/14/14  CBC WITH DIFFERENTIAL      Result Value Ref Range   WBC 6.6  4.0 - 10.5 K/uL   RBC 4.24  3.87 - 5.11 MIL/uL   Hemoglobin 13.2  12.0 - 15.0 g/dL   HCT 16.139.6  09.636.0 - 04.546.0 %   MCV 93.4  78.0 - 100.0 fL   MCH 31.1  26.0 - 34.0 pg   MCHC 33.3  30.0 - 36.0 g/dL   RDW 40.914.1  81.111.5 - 91.415.5 %   Platelets 300  150 - 400 K/uL   Neutrophils Relative % 55  43 - 77 %   Neutro Abs 3.6  1.7 - 7.7 K/uL   Lymphocytes Relative 32  12 - 46 %   Lymphs Abs 2.1  0.7 - 4.0 K/uL   Monocytes Relative 7  3 - 12 %   Monocytes Absolute 0.5  0.1 - 1.0 K/uL   Eosinophils Relative 5  0 - 5 %   Eosinophils Absolute 0.3  0.0 - 0.7 K/uL   Basophils Relative 1  0 - 1 %   Basophils Absolute 0.0  0.0 - 0.1 K/uL  COMPREHENSIVE METABOLIC PANEL      Result Value Ref Range   Sodium 141  137 - 147 mEq/L   Potassium 4.3  3.7 - 5.3 mEq/L   Chloride 101  96 - 112 mEq/L   CO2 28  19 - 32 mEq/L   Glucose, Bld 107 (*) 70 - 99 mg/dL   BUN 8  6 - 23 mg/dL   Creatinine, Ser 7.820.80  0.50 - 1.10 mg/dL   Calcium 9.0  8.4 - 95.610.5 mg/dL   Total Protein 7.0  6.0 - 8.3 g/dL   Albumin 4.1  3.5 - 5.2 g/dL   AST 15  0 - 37 U/L   ALT 10  0 - 35 U/L   Alkaline Phosphatase 64  39 - 117 U/L   Total Bilirubin 0.2 (*) 0.3 - 1.2 mg/dL   GFR calc non Af Amer >90  >90 mL/min   GFR calc Af Amer >90  >90 mL/min   Anion gap 12  5 - 15  URINALYSIS, ROUTINE W REFLEX MICROSCOPIC      Result Value Ref Range   Color, Urine YELLOW  YELLOW   APPearance CLEAR  CLEAR   Specific Gravity, Urine 1.025  1.005 - 1.030   pH 6.0  5.0 - 8.0   Glucose, UA  NEGATIVE  NEGATIVE mg/dL   Hgb urine dipstick NEGATIVE  NEGATIVE   Bilirubin Urine NEGATIVE  NEGATIVE   Ketones, ur NEGATIVE  NEGATIVE mg/dL   Protein, ur TRACE (*) NEGATIVE mg/dL   Urobilinogen, UA 0.2  0.0 - 1.0 mg/dL   Nitrite NEGATIVE  NEGATIVE   Leukocytes, UA TRACE (*) NEGATIVE  PREGNANCY, URINE      Result Value Ref Range   Preg Test, Ur NEGATIVE  NEGATIVE  URINE MICROSCOPIC-ADD ON      Result Value Ref Range   Squamous Epithelial / LPF MANY (*) RARE   WBC, UA 7-10  <3 WBC/hpf   Bacteria, UA MANY (*) RARE  US Transvaginal Non-ob  02/14/2014   CLINICAL DATA:  Pain.  EXAM: TRANSABDOMINAL ULTRASOUND OF PELVIS  TECHNIQUE: Transabdominal ultrasound examination of the pelvis was performed including evaluation of the uterus, ovaries, adnexal regions, and pelvic cul-de-sac.  COMPARISON:  CT 01/16/2014.  FINDINGS: Uterus  Measurements: 8.2 x 3.8 x 5.2 cm. No fibroids or other mass visualized.  Endometrium  Thickness: 5.6 mm.  No focal abnormality visualized.  Right ovary  Measurements: 3.3 x 2.0 x 2.7 cm. Normal appearance/no adnexal mass.  Left ovary  Measurements: 2.9 x 2.3 x 3.6 cm. 1.4 x 1.1 x 1.2 cm complex cyst most likely a small hemorrhagic cyst.  Other findings:  No free fluid.  IMPRESSION: 1.4 cm hemorrhagic cyst left ovary. This is almost certainly benign, and no specific imaging follow up is recommended according to the Society of Radiologists in Ultrasound2010 Consensus Conference Statement (D Lenis Noon et al. Management of Asymptomatic Ovarian and Other Adnexal Cysts Imaged at [place]: Society of Radiologists in Ultrasound Consensus Conference Statement 2010. Radiology 256 (Sept 2010): 943-954.).   Electronically Signed   By: Maisie Fus  Register   On: 02/14/2014 09:18   US Pelvis Complete  02/14/2014   CLINICAL DATA:  Pain.  EXAM: TRANSABDOMINAL ULTRASOUND OF PELVIS  TECHNIQUE: Transabdominal ultrasound examination of the pelvis was performed including evaluation of the uterus, ovaries,  adnexal regions, and pelvic cul-de-sac.  COMPARISON:  CT 01/16/2014.  FINDINGS: Uterus  Measurements: 8.2 x 3.8 x 5.2 cm. No fibroids or other mass visualized.  Endometrium  Thickness: 5.6 mm.  No focal abnormality visualized.  Right ovary  Measurements: 3.3 x 2.0 x 2.7 cm. Normal appearance/no adnexal mass.  Left ovary  Measurements: 2.9 x 2.3 x 3.6 cm. 1.4 x 1.1 x 1.2 cm complex cyst most likely a small hemorrhagic cyst.  Other findings:  No free fluid.  IMPRESSION: 1.4 cm hemorrhagic cyst left ovary. This is almost certainly benign, and no specific imaging follow up is recommended according to the Society of Radiologists in Ultrasound2010 Consensus Conference Statement (D Lenis Noon et al. Management of Asymptomatic Ovarian and Other Adnexal Cysts Imaged at [place]: Society of Radiologists in Ultrasound Consensus Conference Statement 2010. Radiology 256 (Sept 2010): 943-954.).   Electronically Signed   By: Maisie Fus  Register   On: 02/14/2014 09:18   Ct Abdomen Pelvis W Contrast  01/16/2014   CLINICAL DATA:  Right-sided abdominal pain. History of kidney stones.  EXAM: CT ABDOMEN AND PELVIS WITH CONTRAST  TECHNIQUE: Multidetector CT imaging of the abdomen and pelvis was performed using the standard protocol following bolus administration of intravenous contrast.  CONTRAST:  OMNIPAQUE IOHEXOL 300 MG/ML SOLN, 50mL OMNIPAQUE IOHEXOL 300 MG/ML SOLN  COMPARISON:  Abdominal pelvic CT 01/12/2008.  FINDINGS: Lung bases: Clear. No significant pleural or pericardial effusion.  Liver: Normal in density without focal abnormality.  Gallbladder/Biliary system: No evidence of gallstones, biliary dilatation or gallbladder wall thickening.  Pancreas: Unremarkable.  Spleen: Unremarkable.  Adrenal glands: No adrenal mass.  Kidneys/Ureters/Bladder: No evidence of renal mass, urinary tract calculus or hydronephrosis. The bladder appears normal.  Bowel: The stomach, small bowel, appendix and colon appear normal. No inflammatory changes or  wall thickening identified.  Peritoneum: No ascites or peritoneal nodularity.  Lymph Nodes: No enlarged abdominal pelvic lymph nodes. Small lymph nodes within the ileocolonic mesentery are improved from the prior study.  Vascular Structures: No significant vascular findings.  Reproductive Organs: The uterus and ovaries appear normal.  Abdominal wall: Small umbilical hernia containing only fat. No other abdominal  wall abnormalities identified.  Musculoskeletal: No significant osseous findings.  IMPRESSION: 1. No acute findings or explanation for the patient's symptoms. 2. No evidence of appendicitis. 3. No evidence of urinary tract calculus or hydronephrosis.   Electronically Signed   By: Roxy Horseman M.D.   On: 01/16/2014 22:51      Shon Baton, MD 02/14/14 [phone]

## 2014-02-14 NOTE — Discharge Instructions (Signed)
Ovarian Cyst An ovarian cyst is a fluid-filled sac that forms on an ovary. The ovaries are small organs that produce eggs in women. Various types of cysts can form on the ovaries. Most are not cancerous. Many do not cause problems, and they often go away on their own. Some may cause symptoms and require treatment. Common types of ovarian cysts include:  Functional cysts--These cysts may occur every month during the menstrual cycle. This is normal. The cysts usually go away with the next menstrual cycle if the woman does not get pregnant. Usually, there are no symptoms with a functional cyst.  Endometrioma cysts--These cysts form from the tissue that lines the uterus. They are also called "chocolate cysts" because they become filled with blood that turns brown. This type of cyst can cause pain in the lower abdomen during intercourse and with your menstrual period.  Cystadenoma cysts--This type develops from the cells on the outside of the ovary. These cysts can get very big and cause lower abdomen pain and pain with intercourse. This type of cyst can twist on itself, cut off its blood supply, and cause severe pain. It can also easily rupture and cause a lot of pain.  Dermoid cysts--This type of cyst is sometimes found in both ovaries. These cysts may contain different kinds of body tissue, such as skin, teeth, hair, or cartilage. They usually do not cause symptoms unless they get very big.  Theca lutein cysts--These cysts occur when too much of a certain hormone (human chorionic gonadotropin) is produced and overstimulates the ovaries to produce an egg. This is most common after procedures used to assist with the conception of a baby (in vitro fertilization). CAUSES   Fertility drugs can cause a condition in which multiple large cysts are formed on the ovaries. This is called ovarian hyperstimulation syndrome.  A condition called polycystic ovary syndrome can cause hormonal imbalances that can lead to  nonfunctional ovarian cysts. SIGNS AND SYMPTOMS  Many ovarian cysts do not cause symptoms. If symptoms are present, they may include:  Pelvic pain or pressure.  Pain in the lower abdomen.  Pain during sexual intercourse.  Increasing girth (swelling) of the abdomen.  Abnormal menstrual periods.  Increasing pain with menstrual periods.  Stopping having menstrual periods without being pregnant. DIAGNOSIS  These cysts are commonly found during a routine or annual pelvic exam. Tests may be ordered to find out more about the cyst. These tests may include:  Ultrasound.  X-ray of the pelvis.  CT scan.  MRI.  Blood tests. TREATMENT  Many ovarian cysts go away on their own without treatment. Your health care provider may want to check your cyst regularly for 2-3 months to see if it changes. For women in menopause, it is particularly important to monitor a cyst closely because of the higher rate of ovarian cancer in menopausal women. When treatment is needed, it may include any of the following:  A procedure to drain the cyst (aspiration). This may be done using a long needle and ultrasound. It can also be done through a laparoscopic procedure. This involves using a thin, lighted tube with a tiny camera on the end (laparoscope) inserted through a small incision.  Surgery to remove the whole cyst. This may be done using laparoscopic surgery or an open surgery involving a larger incision in the lower abdomen.  Hormone treatment or birth control pills. These methods are sometimes used to help dissolve a cyst. HOME CARE INSTRUCTIONS   Only take over-the-counter   or prescription medicines as directed by your health care provider.  Follow up with your health care provider as directed.  Get regular pelvic exams and Pap tests. SEEK MEDICAL CARE IF:   Your periods are late, irregular, or painful, or they stop.  Your pelvic pain or abdominal pain does not go away.  Your abdomen becomes  larger or swollen.  You have pressure on your bladder or trouble emptying your bladder completely.  You have pain during sexual intercourse.  You have feelings of fullness, pressure, or discomfort in your stomach.  You lose weight for no apparent reason.  You feel generally ill.  You become constipated.  You lose your appetite.  You develop acne.  You have an increase in body and facial hair.  You are gaining weight, without changing your exercise and eating habits.  You think you are pregnant. SEEK IMMEDIATE MEDICAL CARE IF:   You have increasing abdominal pain.  You feel sick to your stomach (nauseous), and you throw up (vomit).  You develop a fever that comes on suddenly.  You have abdominal pain during a bowel movement.  Your menstrual periods become heavier than usual. MAKE SURE YOU:  Understand these instructions.  Will watch your condition.  Will get help right away if you are not doing well or get worse. Document Released: 06/17/2005 Document Revised: 06/22/2013 Document Reviewed: 02/22/2013 Exit[employer] Patient Information 2015 [employer]. This information is not intended to replace advice given to you by your health care provider. Make sure you discuss any questions you have with your health care provider.  

## 2014-02-15 NOTE — ED Provider Notes (Signed)
CSN: [id number]     Arrival date & time 02/14/14  [id number] History   First MD Initiated Contact with Patient 02/14/14 [phone]     Chief Complaint  Patient presents with  . Abdominal Pain  . Nausea  . Emesis     (Consider location/radiation/quality/duration/timing/severity/associated sxs/prior Treatment) Patient is a 23 y.o. female presenting with abdominal pain and vomiting. The history is provided by the patient (the pt complains of lower ab pain).  Abdominal Pain Pain location:  LLQ Pain quality: aching   Pain radiates to:  Does not radiate Onset quality:  Sudden Timing:  Constant Progression:  Worsening Chronicity:  New Associated symptoms: vomiting   Associated symptoms: no chest pain, no cough, no diarrhea, no fatigue and no hematuria   Emesis Associated symptoms: abdominal pain   Associated symptoms: no diarrhea and no headaches     Past Medical History  Diagnosis Date  . GERD (gastroesophageal reflux disease)   . Bipolar 1 disorder   . Depression   . Headache(784.0)   . Urinary tract infection   . Kidney stones   . Carpal tunnel syndrome on both sides     with preg  . Bipolar disorder   . Abnormal Pap smear   . Vaginal discharge 03/11/2013  . RLQ abdominal pain 03/11/2013  . BV (bacterial vaginosis) 03/11/2013  . Ovarian cyst, right   . Obesity 06/17/2013  . Unspecified symptom associated with female genital organs 01/06/2014   Past Surgical History  Procedure Laterality Date  . Tonsillectomy    . Myringotomy     Family History  Problem Relation Age of Onset  . Hypertension Mother   . Hypertension Father   . Hypertension Maternal Aunt   . Hypertension Maternal Uncle   . Hypertension Paternal Aunt   . Hypertension Paternal Uncle   . Diabetes Maternal Grandfather   . Diabetes Paternal Grandmother   . Other Son     swallowing problems   History  Substance Use Topics  . Smoking status: Light Tobacco Smoker -- 0.25 packs/day for 1 years    Types: Cigarettes     Last Attempt to Quit: 04/19/2012  . Smokeless tobacco: Never Used  . Alcohol Use: No   OB History   Grav Para Term Preterm Abortions TAB SAB Ect Mult Living   1 1 1  0 0 0 0 0 0 1     Review of Systems  Constitutional: Negative for appetite change and fatigue.  HENT: Negative for congestion, ear discharge and sinus pressure.   Eyes: Negative for discharge.  Respiratory: Negative for cough.   Cardiovascular: Negative for chest pain.  Gastrointestinal: Positive for vomiting and abdominal pain. Negative for diarrhea.  Genitourinary: Negative for frequency and hematuria.  Musculoskeletal: Negative for back pain.  Skin: Negative for rash.  Neurological: Negative for seizures and headaches.  Psychiatric/Behavioral: Negative for hallucinations.      Allergies  Hydrocodone  Home Medications   Prior to Admission medications   Medication Sig Start Date End Date Taking? Authorizing Provider  benztropine (COGENTIN) 1 MG tablet Take 1 tablet by mouth at bedtime.  02/02/14  Yes Historical Provider, MD  cephALEXin (KEFLEX) 500 MG capsule Take 1 capsule (500 mg total) by mouth 4 (four) times daily. 01/16/14  Yes Flint MelterElliott L Wentz, MD  haloperidol (HALDOL) 1 MG tablet Take 1 mg by mouth at bedtime.  01/04/14  Yes Historical Provider, MD  ibuprofen (ADVIL,MOTRIN) 800 MG tablet Take 1 tablet (800 mg total) by mouth 3 (three)  times daily as needed. pain 01/06/14  Yes Adline Potter, NP  omeprazole (PRILOSEC) 20 MG capsule Take 20 mg by mouth daily.   Yes Historical Provider, MD  Oxcarbazepine (TRILEPTAL) 300 MG tablet Take 300 mg by mouth 2 (two) times daily.   Yes Historical Provider, MD  PARoxetine (PAXIL) 20 MG tablet Take 20 mg by mouth daily. 09/02/13  Yes Historical Provider, MD  ondansetron (ZOFRAN) 4 MG tablet Take 1 tablet (4 mg total) by mouth every 6 (six) hours. 02/14/14   Shon Baton, MD  oxyCODONE-acetaminophen (PERCOCET/ROXICET) 5-325 MG per tablet Take 1 tablet by mouth every 6  (six) hours as needed for moderate pain or severe pain. 02/14/14   Shon Baton, MD   BP 110/54  Pulse 68  Temp(Src) 97.8 F (36.6 C)  Resp 16  Ht 5' (1.524 m)  Wt 176 lb (79.833 kg)  BMI 34.37 kg/m2  SpO2 100%  LMP 01/27/2014 Physical Exam  Constitutional: She is oriented to person, place, and time. She appears well-developed.  HENT:  Head: Normocephalic.  Eyes: Conjunctivae and EOM are normal. No scleral icterus.  Neck: Neck supple. No thyromegaly present.  Cardiovascular: Normal rate and regular rhythm.  Exam reveals no gallop and no friction rub.   No murmur heard. Pulmonary/Chest: No stridor. She has no wheezes. She has no rales. She exhibits no tenderness.  Abdominal: She exhibits no distension. There is tenderness. There is no rebound.  Musculoskeletal: Normal range of motion. She exhibits no edema.  Lymphadenopathy:    She has no cervical adenopathy.  Neurological: She is oriented to person, place, and time. She exhibits normal muscle tone. Coordination normal.  Skin: No rash noted. No erythema.  Psychiatric: She has a normal mood and affect. Her behavior is normal.    ED Course  Procedures (including critical care time) Labs Review Labs Reviewed  COMPREHENSIVE METABOLIC PANEL - Abnormal; Notable for the following:    Glucose, Bld 107 (*)    Total Bilirubin 0.2 (*)    All other components within normal limits  URINALYSIS, ROUTINE W REFLEX MICROSCOPIC - Abnormal; Notable for the following:    Protein, ur TRACE (*)    Leukocytes, UA TRACE (*)    All other components within normal limits  URINE MICROSCOPIC-ADD ON - Abnormal; Notable for the following:    Squamous Epithelial / LPF MANY (*)    Bacteria, UA MANY (*)    All other components within normal limits  CBC WITH DIFFERENTIAL  PREGNANCY, URINE    Imaging Review US Transvaginal Non-ob  02/14/2014   CLINICAL DATA:  Pain.  EXAM: TRANSABDOMINAL ULTRASOUND OF PELVIS  TECHNIQUE: Transabdominal ultrasound  examination of the pelvis was performed including evaluation of the uterus, ovaries, adnexal regions, and pelvic cul-de-sac.  COMPARISON:  CT 01/16/2014.  FINDINGS: Uterus  Measurements: 8.2 x 3.8 x 5.2 cm. No fibroids or other mass visualized.  Endometrium  Thickness: 5.6 mm.  No focal abnormality visualized.  Right ovary  Measurements: 3.3 x 2.0 x 2.7 cm. Normal appearance/no adnexal mass.  Left ovary  Measurements: 2.9 x 2.3 x 3.6 cm. 1.4 x 1.1 x 1.2 cm complex cyst most likely a small hemorrhagic cyst.  Other findings:  No free fluid.  IMPRESSION: 1.4 cm hemorrhagic cyst left ovary. This is almost certainly benign, and no specific imaging follow up is recommended according to the Society of Radiologists in Ultrasound2010 Consensus Conference Statement (D Lenis Noon et al. Management of Asymptomatic Ovarian and Other Adnexal Cysts Imaged  at [place]: Society of Radiologists in Ultrasound Consensus Conference Statement 2010. Radiology 256 (Sept 2010): 943-954.).   Electronically Signed   By: Maisie Fus  Register   On: 02/14/2014 09:18   US Pelvis Complete  02/14/2014   CLINICAL DATA:  Pain.  EXAM: TRANSABDOMINAL ULTRASOUND OF PELVIS  TECHNIQUE: Transabdominal ultrasound examination of the pelvis was performed including evaluation of the uterus, ovaries, adnexal regions, and pelvic cul-de-sac.  COMPARISON:  CT 01/16/2014.  FINDINGS: Uterus  Measurements: 8.2 x 3.8 x 5.2 cm. No fibroids or other mass visualized.  Endometrium  Thickness: 5.6 mm.  No focal abnormality visualized.  Right ovary  Measurements: 3.3 x 2.0 x 2.7 cm. Normal appearance/no adnexal mass.  Left ovary  Measurements: 2.9 x 2.3 x 3.6 cm. 1.4 x 1.1 x 1.2 cm complex cyst most likely a small hemorrhagic cyst.  Other findings:  No free fluid.  IMPRESSION: 1.4 cm hemorrhagic cyst left ovary. This is almost certainly benign, and no specific imaging follow up is recommended according to the Society of Radiologists in Ultrasound2010 Consensus Conference Statement (D  Lenis Noon et al. Management of Asymptomatic Ovarian and Other Adnexal Cysts Imaged at [place]: Society of Radiologists in Ultrasound Consensus Conference Statement 2010. Radiology 256 (Sept 2010): 943-954.).   Electronically Signed   By: Maisie Fus  Register   On: 02/14/2014 09:18     EKG Interpretation None      MDM   Final diagnoses:  Cyst of left ovary        Benny Lennert, MD 02/15/14 [phone]

## 2014-03-02 ENCOUNTER — Encounter (HOSPITAL_COMMUNITY): Payer: Self-pay | Admitting: Emergency Medicine

## 2014-04-01 ENCOUNTER — Emergency Department (HOSPITAL_COMMUNITY)
Admission: EM | Admit: 2014-04-01 | Discharge: 2014-04-01 | Disposition: A | Discharge status: Home / Self Care | Payer: Medicare Other | Attending: Emergency Medicine | Admitting: Emergency Medicine

## 2014-04-01 ENCOUNTER — Emergency Department (HOSPITAL_COMMUNITY): Payer: Medicare Other

## 2014-04-01 ENCOUNTER — Encounter (HOSPITAL_COMMUNITY): Payer: Self-pay | Admitting: Emergency Medicine

## 2014-04-01 DIAGNOSIS — Z8739 Personal history of other diseases of the musculoskeletal system and connective tissue: Secondary | ICD-10-CM | POA: Diagnosis not present

## 2014-04-01 DIAGNOSIS — F419 Anxiety disorder, unspecified: Secondary | ICD-10-CM | POA: Diagnosis not present

## 2014-04-01 DIAGNOSIS — Z8619 Personal history of other infectious and parasitic diseases: Secondary | ICD-10-CM | POA: Insufficient documentation

## 2014-04-01 DIAGNOSIS — F329 Major depressive disorder, single episode, unspecified: Secondary | ICD-10-CM | POA: Insufficient documentation

## 2014-04-01 DIAGNOSIS — Z3202 Encounter for pregnancy test, result negative: Secondary | ICD-10-CM | POA: Insufficient documentation

## 2014-04-01 DIAGNOSIS — Z87442 Personal history of urinary calculi: Secondary | ICD-10-CM | POA: Diagnosis not present

## 2014-04-01 DIAGNOSIS — K219 Gastro-esophageal reflux disease without esophagitis: Secondary | ICD-10-CM | POA: Insufficient documentation

## 2014-04-01 DIAGNOSIS — N390 Urinary tract infection, site not specified: Secondary | ICD-10-CM | POA: Diagnosis not present

## 2014-04-01 DIAGNOSIS — Z720 Tobacco use: Secondary | ICD-10-CM | POA: Diagnosis not present

## 2014-04-01 DIAGNOSIS — R109 Unspecified abdominal pain: Secondary | ICD-10-CM | POA: Diagnosis present

## 2014-04-01 DIAGNOSIS — E669 Obesity, unspecified: Secondary | ICD-10-CM | POA: Insufficient documentation

## 2014-04-01 LAB — URINALYSIS, ROUTINE W REFLEX MICROSCOPIC
Bilirubin Urine: NEGATIVE
Glucose, UA: NEGATIVE mg/dL
Ketones, ur: NEGATIVE mg/dL
Nitrite: POSITIVE — AB
Protein, ur: 100 mg/dL — AB
Specific Gravity, Urine: 1.025 (ref 1.005–1.030)
Urobilinogen, UA: 0.2 mg/dL (ref 0.0–1.0)
pH: 6.5 (ref 5.0–8.0)

## 2014-04-01 LAB — BASIC METABOLIC PANEL
Anion gap: 12 (ref 5–15)
BUN: 5 mg/dL — ABNORMAL LOW (ref 6–23)
CO2: 27 mEq/L (ref 19–32)
Calcium: 9.4 mg/dL (ref 8.4–10.5)
Chloride: 102 mEq/L (ref 96–112)
Creatinine, Ser: 0.87 mg/dL (ref 0.50–1.10)
GFR calc Af Amer: >90 mL/min (ref >90)
GFR calc non Af Amer: >90 mL/min (ref >90)
Glucose, Bld: 99 mg/dL (ref 70–99)
Potassium: 4.8 mEq/L (ref 3.7–5.3)
Sodium: 141 mEq/L (ref 137–147)

## 2014-04-01 LAB — CBC WITH DIFFERENTIAL/PLATELET
Basophils Absolute: 0 10*3/uL (ref 0.0–0.1)
Basophils Relative: 0 % (ref 0–1)
Eosinophils Absolute: 0.3 10*3/uL (ref 0.0–0.7)
Eosinophils Relative: 4 % (ref 0–5)
HCT: 40.9 % (ref 36.0–46.0)
Hemoglobin: 13.4 g/dL (ref 12.0–15.0)
Lymphocytes Relative: 24 % (ref 12–46)
Lymphs Abs: 1.9 10*3/uL (ref 0.7–4.0)
MCH: 30.9 pg (ref 26.0–34.0)
MCHC: 32.8 g/dL (ref 30.0–36.0)
MCV: 94.2 fL (ref 78.0–100.0)
Monocytes Absolute: 0.5 10*3/uL (ref 0.1–1.0)
Monocytes Relative: 7 % (ref 3–12)
Neutro Abs: 5.2 10*3/uL (ref 1.7–7.7)
Neutrophils Relative %: 65 % (ref 43–77)
Platelets: 231 10*3/uL (ref 150–400)
RBC: 4.34 MIL/uL (ref 3.87–5.11)
RDW: 14.1 % (ref 11.5–15.5)
WBC: 7.9 10*3/uL (ref 4.0–10.5)

## 2014-04-01 LAB — URINE MICROSCOPIC-ADD ON

## 2014-04-01 LAB — PREGNANCY, URINE: Preg Test, Ur: NEGATIVE

## 2014-04-01 MED ORDER — PROMETHAZINE HCL 25 MG/ML IJ SOLN
12.5000 mg | Freq: Once | INTRAMUSCULAR | Status: AC
  Administered 2014-04-01: 12.5 mg via INTRAVENOUS
  Filled 2014-04-01: qty 1

## 2014-04-01 MED ORDER — HYDROMORPHONE HCL 1 MG/ML IJ SOLN
1.0000 mg | Freq: Once | INTRAMUSCULAR | Status: AC
  Administered 2014-04-01: 1 mg via INTRAVENOUS
  Filled 2014-04-01: qty 1

## 2014-04-01 MED ORDER — PHENAZOPYRIDINE HCL 200 MG PO TABS
200.0000 mg | ORAL_TABLET | Freq: Three times a day (TID) | ORAL | Status: DC
Start: 2014-04-01 — End: 2014-05-09

## 2014-04-01 MED ORDER — CEPHALEXIN 500 MG PO CAPS: 500.0000 mg | ORAL_CAPSULE | Freq: Four times a day (QID) | ORAL | Status: DC

## 2014-04-01 MED ORDER — SODIUM CHLORIDE 0.9 % IV SOLN
INTRAVENOUS | Status: AC
  Administered 2014-04-01: 17:00:00 via INTRAVENOUS

## 2014-04-01 MED ORDER — PHENAZOPYRIDINE HCL 100 MG PO TABS
200.0000 mg | ORAL_TABLET | Freq: Once | ORAL | Status: AC
  Administered 2014-04-01: 200 mg via ORAL
  Filled 2014-04-01: qty 2

## 2014-04-01 MED ORDER — DEXTROSE 5 % IV SOLN
1.0000 g | Freq: Once | INTRAVENOUS | Status: AC
  Administered 2014-04-01: 1 g via INTRAVENOUS
  Filled 2014-04-01: qty 10

## 2014-04-01 NOTE — ED Notes (Signed)
Patient with no complaints at this time. Respirations even and unlabored. Skin warm/dry. Discharge instructions reviewed with patient at this time. Patient given opportunity to voice concerns/ask questions. IV removed per policy and band-aid applied to site. Patient discharged at this time and left Emergency Department with steady gait.  

## 2014-04-01 NOTE — ED Provider Notes (Signed)
CSN: [id number]     Arrival date & time 04/01/14  1312 History   First MD Initiated Contact with Patient 04/01/14 1613     Chief Complaint  Patient presents with  . Abdominal Pain     (Consider location/radiation/quality/duration/timing/severity/associated sxs/prior Treatment) Patient is a 23 y.o. female presenting with abdominal pain. The history is provided by the patient.  Abdominal Pain Pain location:  Suprapubic Pain quality: aching   Pain radiates to:  Does not radiate Pain severity:  Moderate Onset quality:  Gradual Duration:  2 days Timing:  Intermittent Chronicity:  New Relieved by:  Nothing Associated symptoms: nausea and vomiting   Associated symptoms: no chills, no fever, no vaginal bleeding and no vaginal discharge  Dysuria: presssure and pain.    Connie [name] is a 23 y.o. female who presents to the ED with abdominal pain and dark urine that started 2 days ago. She had a couple days of diarrhea but that resolved. Patient denies vaginal bleeding or discharge. She is not sexually active.   Past Medical History  Diagnosis Date  . GERD (gastroesophageal reflux disease)   . Bipolar 1 disorder   . Depression   . Headache(784.0)   . Urinary tract infection   . Kidney stones   . Carpal tunnel syndrome on both sides     with preg  . Bipolar disorder   . Abnormal Pap smear   . Vaginal discharge 03/11/2013  . RLQ abdominal pain 03/11/2013  . BV (bacterial vaginosis) 03/11/2013  . Ovarian cyst, right   . Obesity 06/17/2013  . Unspecified symptom associated with female genital organs 01/06/2014   Past Surgical History  Procedure Laterality Date  . Tonsillectomy and adenoidectomy Bilateral   . Tonsillectomy    . Myringotomy     Family History  Problem Relation Age of Onset  . Hypertension Mother   . Hypertension Father   . Hypertension Maternal Aunt   . Hypertension Maternal Uncle   . Hypertension Paternal Aunt   . Hypertension Paternal Uncle   . Diabetes  Maternal Grandfather   . Diabetes Paternal Grandmother   . Other Son     swallowing problems   History  Substance Use Topics  . Smoking status: Light Tobacco Smoker -- 0.25 packs/day for 1 years    Types: Cigarettes    Last Attempt to Quit: 04/19/2012  . Smokeless tobacco: Never Used  . Alcohol Use: No   OB History   Grav Para Term Preterm Abortions TAB SAB Ect Mult Living   1 1 1  0 0 0 0 0 0 1     Review of Systems  Constitutional: Negative for fever and chills.  HENT: Negative.   Eyes: Negative for visual disturbance.  Gastrointestinal: Positive for nausea, vomiting and abdominal pain.  Genitourinary: Positive for frequency and flank pain. Negative for urgency, vaginal bleeding and vaginal discharge. Dysuria: presssure and pain.  Musculoskeletal: Positive for back pain.  Skin: Negative for rash.  Neurological: Negative for dizziness, syncope and headaches.  Psychiatric/Behavioral: Negative for confusion. The patient is not nervous/anxious.       Allergies  Hydrocodone and Hydrocodone  Home Medications   Prior to Admission medications   Medication Sig Start Date End Date Taking? Authorizing Provider  ibuprofen (ADVIL,MOTRIN) 200 MG tablet Take 200 mg by mouth every 6 (six) hours as needed for moderate pain.   Yes Historical Provider, MD  omeprazole (PRILOSEC) 20 MG capsule Take 20 mg by mouth daily.   Yes Historical Provider,  MD  Oxcarbazepine (TRILEPTAL) 300 MG tablet Take 300 mg by mouth 2 (two) times daily.   Yes Historical Provider, MD  PARoxetine (PAXIL) 20 MG tablet Take 20 mg by mouth daily. 09/02/13  Yes Historical Provider, MD  traZODone (DESYREL) 100 MG tablet Take 100 mg by mouth at bedtime.   Yes Historical Provider, MD   BP 130/68  Pulse 66  Temp(Src) 97.9 F (36.6 C)  Resp 17  Ht 5' (1.524 m)  Wt 180 lb (81.647 kg)  BMI 35.15 kg/m2  SpO2 100%  LMP 03/02/2014  Breastfeeding? No Physical Exam  Nursing note and vitals reviewed. Constitutional: She  is oriented to person, place, and time. She appears well-developed and well-nourished.  HENT:  Head: Normocephalic.  Eyes: EOM are normal.  Neck: Neck supple.  Cardiovascular: Normal rate.   Pulmonary/Chest: Effort normal.  Abdominal: Soft. There is tenderness in the right lower quadrant, suprapubic area and left lower quadrant. There is no rebound, no guarding and no CVA tenderness.  Musculoskeletal: Normal range of motion.  Neurological: She is alert and oriented to person, place, and time. No cranial nerve deficit.  Skin: Skin is warm and dry.  Psychiatric: She has a normal mood and affect. Her behavior is normal.    ED Course  Procedures (including critical care time) Labs Review Results for orders placed during the hospital encounter of 04/01/14 (from the past 24 hour(s))  URINALYSIS, ROUTINE W REFLEX MICROSCOPIC     Status: Abnormal   Collection Time    04/01/14  1:26 PM      Result Value Ref Range   Color, Urine YELLOW  YELLOW   APPearance TURBID (*) CLEAR   Specific Gravity, Urine 1.025  1.005 - 1.030   pH 6.5  5.0 - 8.0   Glucose, UA NEGATIVE  NEGATIVE mg/dL   Hgb urine dipstick LARGE (*) NEGATIVE   Bilirubin Urine NEGATIVE  NEGATIVE   Ketones, ur NEGATIVE  NEGATIVE mg/dL   Protein, ur 161 (*) NEGATIVE mg/dL   Urobilinogen, UA 0.2  0.0 - 1.0 mg/dL   Nitrite POSITIVE (*) NEGATIVE   Leukocytes, UA MODERATE (*) NEGATIVE  URINE MICROSCOPIC-ADD ON     Status: Abnormal   Collection Time    04/01/14  1:26 PM      Result Value Ref Range   Squamous Epithelial / LPF MANY (*) RARE   WBC, UA 21-50  <3 WBC/hpf   RBC / HPF 21-50  <3 RBC/hpf   Bacteria, UA MANY (*) RARE  CBC WITH DIFFERENTIAL     Status: None   Collection Time    04/01/14  2:02 PM      Result Value Ref Range   WBC 7.9  4.0 - 10.5 K/uL   RBC 4.34  3.87 - 5.11 MIL/uL   Hemoglobin 13.4  12.0 - 15.0 g/dL   HCT 09.6  04.5 - 40.9 %   MCV 94.2  78.0 - 100.0 fL   MCH 30.9  26.0 - 34.0 pg   MCHC 32.8  30.0 - 36.0  g/dL   RDW 81.1  91.4 - 78.2 %   Platelets 231  150 - 400 K/uL   Neutrophils Relative % 65  43 - 77 %   Neutro Abs 5.2  1.7 - 7.7 K/uL   Lymphocytes Relative 24  12 - 46 %   Lymphs Abs 1.9  0.7 - 4.0 K/uL   Monocytes Relative 7  3 - 12 %   Monocytes Absolute 0.5  0.1 -  1.0 K/uL   Eosinophils Relative 4  0 - 5 %   Eosinophils Absolute 0.3  0.0 - 0.7 K/uL   Basophils Relative 0  0 - 1 %   Basophils Absolute 0.0  0.0 - 0.1 K/uL  BASIC METABOLIC PANEL     Status: Abnormal   Collection Time    04/01/14  2:02 PM      Result Value Ref Range   Sodium 141  137 - 147 mEq/L   Potassium 4.8  3.7 - 5.3 mEq/L   Chloride 102  96 - 112 mEq/L   CO2 27  19 - 32 mEq/L   Glucose, Bld 99  70 - 99 mg/dL   BUN 5 (*) 6 - 23 mg/dL   Creatinine, Ser 1.61  0.50 - 1.10 mg/dL   Calcium 9.4  8.4 - 09.6 mg/dL   GFR calc non Af Amer >90  >90 mL/min   GFR calc Af Amer >90  >90 mL/min   Anion gap 12  5 - 15   After IV hydration, Dilaudid 1 mg and Phenergan 12.5 mg the patient was feeling better. @ 1830 the patient called out and said the pain had gotten worse again. Will send for renal CT to look for stone. Dilaudid 1 mg. IV repeated.  Patient feeling better after fluids and pain management.   Ct Renal Stone Study  04/01/2014   CLINICAL DATA:  Two days of suprapubic pain; history of urinary tract stones, urinary tract infections, an ovarian cysts.  EXAM: CT ABDOMEN AND PELVIS WITHOUT CONTRAST  TECHNIQUE: Multidetector CT imaging of the abdomen and pelvis was performed following the standard protocol without IV contrast.  COMPARISON:  CT scan of the abdomen and pelvis of January 16, 2014  FINDINGS: The kidneys exhibit normal density and contour. There is no hydronephrosis in not of hydronephrosis or calcified stones. The ureters and urinary bladder are unremarkable. The uterus and adnexal structures exhibit no acute abnormalities. There is no free pelvic fluid.  The liver, gallbladder, pancreas, spleen, adrenal glands,  and periaortic and pericaval regions are normal. The stomach is moderately distended with food. The small and large bowel exhibit no ileus, obstruction, or acute inflammation. The appendix is normal. There is no inguinal hernia. There is a small umbilical hernia containing fat. There is no lymphadenopathy.  The lung bases are clear. The lumbar spine and bony pelvis exhibit no acute abnormalities. There is a limbus vertebra at L4.  IMPRESSION: No acute intra-abdominal or pelvic abnormality is demonstrated. There is no evidence of acute appendicitis. There is no evidence of urinary tract stones or obstruction. No acute gynecological abnormality is demonstrated.   Electronically Signed   By: David  [place]   On: 04/01/2014 19:28    MDM I discussed this case with Dr. Lynelle Doctor  23 y.o. female with UTI. Will treat with antibiotics and send urine for culture. She will follow up with her PCP. I have reviewed this patient's vital signs, nurses notes, appropriate labs and imaging.  I have discussed findings with the patient and plan of care. Patient voices understanding and agrees with plan.    Medication List    TAKE these medications       cephALEXin 500 MG capsule  Commonly known as:  KEFLEX  Take 1 capsule (500 mg total) by mouth 4 (four) times daily.     phenazopyridine 200 MG tablet  Commonly known as:  PYRIDIUM  Take 1 tablet (200 mg total) by mouth 3 (three) times  daily.      ASK your doctor about these medications       ibuprofen 200 MG tablet  Commonly known as:  ADVIL,MOTRIN  Take 200 mg by mouth every 6 (six) hours as needed for moderate pain.     omeprazole 20 MG capsule  Commonly known as:  PRILOSEC  Take 20 mg by mouth daily.     Oxcarbazepine 300 MG tablet  Commonly known as:  TRILEPTAL  Take 300 mg by mouth 2 (two) times daily.     PARoxetine 20 MG tablet  Commonly known as:  PAXIL  Take 20 mg by mouth daily.     traZODone 100 MG tablet  Commonly known as:  DESYREL  Take  100 mg by mouth at bedtime.           [facility] Orlene OchM Neese, [place]NP 04/02/14 [phone]

## 2014-04-01 NOTE — Discharge Instructions (Signed)
Your CT scan today is normal. Your blood work is normal. Your urine shows that you have a urinary tract infection. Be sure you are drinking 8 glasses of water a day. Follow up with your doctor. We have sent the urine for culture. If we need to change your antibiotic someone will call you.

## 2014-04-01 NOTE — ED Notes (Signed)
Low abd pain , urine "dark". N/v.   Diarrhea 2 days ago ,none now.  Hx of kidney stone.  No fever or chills

## 2014-04-04 LAB — URINE CULTURE: Colony Count: >=100000

## 2014-04-04 NOTE — ED Provider Notes (Signed)
Medical screening examination/treatment/procedure(s) were performed by non-physician practitioner and as supervising physician I was immediately available for consultation/collaboration.   EKG Interpretation None      Devoria AlbeIva Francyne Arreaga, MD, Armando GangFACEP   Ward GivensIva L Meka Lewan, MD 04/04/14 2220

## 2014-04-05 ENCOUNTER — Telehealth (HOSPITAL_BASED_OUTPATIENT_CLINIC_OR_DEPARTMENT_OTHER): Payer: Self-pay | Admitting: Emergency Medicine

## 2014-04-05 NOTE — Telephone Encounter (Signed)
Post ED Visit - Positive Culture Follow-up  Culture report reviewed by antimicrobial stewardship pharmacist: []  [name], Pharm.D., BCPS [x]  [name], Pharm.D., BCPS []  Georgina PillionElizabeth Martin, [address] BCPS []  Melody HillMinh Pham, [address]Ph[address]D., BCPS, AAHIVP []  Estella HuskMichelle Turner, Pharm.D., BCPS, AAHIVP []  [name], Pharm.D. []  [name], [address]Ph[address]  Positive urine culture E. Coli Treated with cephalexin, organism sensitive to the same and no further patient follow-up is required at this time.  Berle MullMiller, Vu Liebman 04/05/2014, 11:28 AM

## 2014-04-11 ENCOUNTER — Emergency Department (HOSPITAL_COMMUNITY)
Admission: EM | Admit: 2014-04-11 | Discharge: 2014-04-12 | Disposition: A | Discharge status: Home / Self Care | Payer: Medicare Other | Attending: Emergency Medicine | Admitting: Emergency Medicine

## 2014-04-11 ENCOUNTER — Encounter (HOSPITAL_COMMUNITY): Payer: Self-pay | Admitting: Emergency Medicine

## 2014-04-11 DIAGNOSIS — E669 Obesity, unspecified: Secondary | ICD-10-CM | POA: Diagnosis not present

## 2014-04-11 DIAGNOSIS — Z87440 Personal history of urinary (tract) infections: Secondary | ICD-10-CM | POA: Diagnosis not present

## 2014-04-11 DIAGNOSIS — N830 Follicular cyst of ovary: Secondary | ICD-10-CM | POA: Diagnosis not present

## 2014-04-11 DIAGNOSIS — Z87442 Personal history of urinary calculi: Secondary | ICD-10-CM | POA: Insufficient documentation

## 2014-04-11 DIAGNOSIS — R197 Diarrhea, unspecified: Secondary | ICD-10-CM | POA: Diagnosis not present

## 2014-04-11 DIAGNOSIS — Z792 Long term (current) use of antibiotics: Secondary | ICD-10-CM | POA: Insufficient documentation

## 2014-04-11 DIAGNOSIS — R112 Nausea with vomiting, unspecified: Secondary | ICD-10-CM

## 2014-04-11 DIAGNOSIS — Z8619 Personal history of other infectious and parasitic diseases: Secondary | ICD-10-CM | POA: Insufficient documentation

## 2014-04-11 DIAGNOSIS — Z8739 Personal history of other diseases of the musculoskeletal system and connective tissue: Secondary | ICD-10-CM | POA: Insufficient documentation

## 2014-04-11 DIAGNOSIS — Z79899 Other long term (current) drug therapy: Secondary | ICD-10-CM | POA: Diagnosis not present

## 2014-04-11 DIAGNOSIS — Z87448 Personal history of other diseases of urinary system: Secondary | ICD-10-CM | POA: Insufficient documentation

## 2014-04-11 DIAGNOSIS — Z720 Tobacco use: Secondary | ICD-10-CM | POA: Insufficient documentation

## 2014-04-11 DIAGNOSIS — J9801 Acute bronchospasm: Secondary | ICD-10-CM

## 2014-04-11 DIAGNOSIS — Z3202 Encounter for pregnancy test, result negative: Secondary | ICD-10-CM | POA: Diagnosis not present

## 2014-04-11 DIAGNOSIS — K219 Gastro-esophageal reflux disease without esophagitis: Secondary | ICD-10-CM | POA: Insufficient documentation

## 2014-04-11 DIAGNOSIS — N83202 Unspecified ovarian cyst, left side: Secondary | ICD-10-CM

## 2014-04-11 DIAGNOSIS — F319 Bipolar disorder, unspecified: Secondary | ICD-10-CM | POA: Insufficient documentation

## 2014-04-11 DIAGNOSIS — R1031 Right lower quadrant pain: Secondary | ICD-10-CM | POA: Diagnosis present

## 2014-04-11 LAB — CBC WITH DIFFERENTIAL/PLATELET
Basophils Absolute: 0.1 10*3/uL (ref 0.0–0.1)
Basophils Relative: 1 % (ref 0–1)
Eosinophils Absolute: 0.3 10*3/uL (ref 0.0–0.7)
Eosinophils Relative: 3 % (ref 0–5)
HCT: 40.8 % (ref 36.0–46.0)
Hemoglobin: 13.8 g/dL (ref 12.0–15.0)
Lymphocytes Relative: 33 % (ref 12–46)
Lymphs Abs: 3.6 10*3/uL (ref 0.7–4.0)
MCH: 31.4 pg (ref 26.0–34.0)
MCHC: 33.8 g/dL (ref 30.0–36.0)
MCV: 92.9 fL (ref 78.0–100.0)
Monocytes Absolute: 0.7 10*3/uL (ref 0.1–1.0)
Monocytes Relative: 6 % (ref 3–12)
Neutro Abs: 6.2 10*3/uL (ref 1.7–7.7)
Neutrophils Relative %: 57 % (ref 43–77)
Platelets: 291 10*3/uL (ref 150–400)
RBC: 4.39 MIL/uL (ref 3.87–5.11)
RDW: 13.9 % (ref 11.5–15.5)
WBC: 10.8 10*3/uL — ABNORMAL HIGH (ref 4.0–10.5)

## 2014-04-11 MED ORDER — IPRATROPIUM-ALBUTEROL 0.5-2.5 (3) MG/3ML IN SOLN
3.0000 mL | RESPIRATORY_TRACT | Status: DC
  Administered 2014-04-11: 3 mL via RESPIRATORY_TRACT
  Filled 2014-04-11: qty 3

## 2014-04-11 MED ORDER — ONDANSETRON HCL 4 MG/2ML IJ SOLN
4.0000 mg | Freq: Once | INTRAMUSCULAR | Status: AC
  Administered 2014-04-12: 4 mg via INTRAVENOUS
  Filled 2014-04-11: qty 2

## 2014-04-11 MED ORDER — FENTANYL CITRATE 0.05 MG/ML IJ SOLN
100.0000 ug | Freq: Once | INTRAMUSCULAR | Status: AC
  Administered 2014-04-12: 100 ug via INTRAVENOUS
  Filled 2014-04-11: qty 2

## 2014-04-11 MED ORDER — SODIUM CHLORIDE 0.9 % IV BOLUS (SEPSIS)
1000.0000 mL | Freq: Once | INTRAVENOUS | Status: AC
  Administered 2014-04-12: 1000 mL via INTRAVENOUS

## 2014-04-11 NOTE — ED Provider Notes (Signed)
CSN: [id number]     Arrival date & time 04/11/14  2027 History  This chart was scribed for Hanley Seamen, MD by Connie [name], ED Scribe. This patient was seen in room APA01/APA01 and the patient's care was started at 11:39 PM.   Chief Complaint  Patient presents with  . GI Problem   The history is provided by the patient. No language interpreter was used.   HPI Comments: Connie [name] is a 23 y.o. female who presents to the Emergency Department complaining of  10/10, RLQ and abdominal pain that started a week ago. Pt complains of intermittent yellow diarrhea, vomiting, coughing spells that have made her lose her urine, and chills as associated symptoms. She states that she has not eaten in 2 days. Pt denies a fever as an associated symptom.   Past Medical History  Diagnosis Date  . GERD (gastroesophageal reflux disease)   . Bipolar 1 disorder   . Depression   . Headache(784.0)   . Urinary tract infection   . Kidney stones   . Carpal tunnel syndrome on both sides     with preg  . Bipolar disorder   . Abnormal Pap smear   . Vaginal discharge 03/11/2013  . RLQ abdominal pain 03/11/2013  . BV (bacterial vaginosis) 03/11/2013  . Ovarian cyst, right   . Obesity 06/17/2013  . Unspecified symptom associated with female genital organs 01/06/2014   Past Surgical History  Procedure Laterality Date  . Tonsillectomy and adenoidectomy Bilateral   . Tonsillectomy    . Myringotomy     Family History  Problem Relation Age of Onset  . Hypertension Mother   . Hypertension Father   . Hypertension Maternal Aunt   . Hypertension Maternal Uncle   . Hypertension Paternal Aunt   . Hypertension Paternal Uncle   . Diabetes Maternal Grandfather   . Diabetes Paternal Grandmother   . Other Son     swallowing problems   History  Substance Use Topics  . Smoking status: Light Tobacco Smoker -- 0.25 packs/day for 1 years    Types: Cigarettes    Last Attempt to Quit: 04/19/2012  . Smokeless  tobacco: Never Used  . Alcohol Use: No   OB History   Grav Para Term Preterm Abortions TAB SAB Ect Mult Living   1 1 1  0 0 0 0 0 0 1     Review of Systems  Constitutional: Positive for chills. Negative for fever.  Respiratory: Positive for cough.   Gastrointestinal: Positive for vomiting, abdominal pain and diarrhea.   10 Systems reviewed and all are negative for acute change except as noted in the HPI.  Allergies  Hydrocodone  Home Medications   Prior to Admission medications   Medication Sig Start Date End Date Taking? Authorizing Provider  cephALEXin (KEFLEX) 500 MG capsule Take 1 capsule (500 mg total) by mouth 4 (four) times daily. 04/01/14   Hope Orlene Och, NP  ibuprofen (ADVIL,MOTRIN) 200 MG tablet Take 200 mg by mouth every 6 (six) hours as needed for moderate pain.    Historical Provider, MD  omeprazole (PRILOSEC) 20 MG capsule Take 20 mg by mouth daily.    Historical Provider, MD  Oxcarbazepine (TRILEPTAL) 300 MG tablet Take 300 mg by mouth 2 (two) times daily.    Historical Provider, MD  PARoxetine (PAXIL) 20 MG tablet Take 20 mg by mouth daily. 09/02/13   Historical Provider, MD  phenazopyridine (PYRIDIUM) 200 MG tablet Take 1 tablet (200 mg total)  by mouth 3 (three) times daily. 04/01/14   Hope Orlene OchM Neese, NP  traZODone (DESYREL) 100 MG tablet Take 100 mg by mouth at bedtime.    Historical Provider, MD   BP 106/49  Pulse 80  Temp(Src) 98.7 F (37.1 C) (Oral)  Resp 24  Ht 5' (1.524 m)  Wt 178 lb (80.74 kg)  BMI 34.76 kg/m2  SpO2 100%  LMP 03/02/2014 Physical Exam  Nursing note and vitals reviewed.  General: Well-developed, well-nourished female in no acute distress; appearance consistent with age of record HENT: normocephalic; atraumatic; pharynx normal Eyes: pupils equal, round and reactive to light; extraocular muscles intact Neck: supple Heart: regular rate and rhythm; no murmurs, rubs or gallops Lungs: faint expiratory wheezes and cough on deep  inspiration Abdomen: soft; nondistended; no masses or hepatosplenomegaly; bowel sounds present; right-sided abdominal tenderness, more prominent in RLQ. GU: Right CVA tenderness  Extremities: No deformity; full range of motion; pulses normal Neurologic: Awake, alert and oriented; motor function intact in all extremities and symmetric; no facial droop Skin: Warm and dry Psychiatric: Normal mood and affect  ED Course  Procedures (including critical care time)  DIAGNOSTIC STUDIES: Oxygen Saturation is 100% on RA, normal by my interpretation.    COORDINATION OF CARE: 11:46 PM Discussed treatment plan with pt at bedside and pt agreed to plan.  MDM   Nursing notes and vitals signs, including pulse oximetry, reviewed.  Summary of this visit's results, reviewed by myself:  Labs:  Results for orders placed during the hospital encounter of 04/11/14 (from the past 24 hour(s))  CBC WITH DIFFERENTIAL     Status: Abnormal   Collection Time    04/11/14 11:54 PM      Result Value Ref Range   WBC 10.8 (*) 4.0 - 10.5 K/uL   RBC 4.39  3.87 - 5.11 MIL/uL   Hemoglobin 13.8  12.0 - 15.0 g/dL   HCT 16.140.8  09.636.0 - 04.546.0 %   MCV 92.9  78.0 - 100.0 fL   MCH 31.4  26.0 - 34.0 pg   MCHC 33.8  30.0 - 36.0 g/dL   RDW 40.913.9  81.111.5 - 91.415.5 %   Platelets 291  150 - 400 K/uL   Neutrophils Relative % 57  43 - 77 %   Neutro Abs 6.2  1.7 - 7.7 K/uL   Lymphocytes Relative 33  12 - 46 %   Lymphs Abs 3.6  0.7 - 4.0 K/uL   Monocytes Relative 6  3 - 12 %   Monocytes Absolute 0.7  0.1 - 1.0 K/uL   Eosinophils Relative 3  0 - 5 %   Eosinophils Absolute 0.3  0.0 - 0.7 K/uL   Basophils Relative 1  0 - 1 %   Basophils Absolute 0.1  0.0 - 0.1 K/uL  COMPREHENSIVE METABOLIC PANEL     Status: Abnormal   Collection Time    04/11/14 11:54 PM      Result Value Ref Range   Sodium 138  137 - 147 mEq/L   Potassium 4.1  3.7 - 5.3 mEq/L   Chloride 101  96 - 112 mEq/L   CO2 24  19 - 32 mEq/L   Glucose, Bld 94  70 - 99 mg/dL    BUN 5 (*) 6 - 23 mg/dL   Creatinine, Ser 7.820.86  0.50 - 1.10 mg/dL   Calcium 9.8  8.4 - 95.610.5 mg/dL   Total Protein 7.6  6.0 - 8.3 g/dL   Albumin 4.2  3.5 -  5.2 g/dL   AST 16  0 - 37 U/L   ALT 14  0 - 35 U/L   Alkaline Phosphatase 61  39 - 117 U/L   Total Bilirubin 0.2 (*) 0.3 - 1.2 mg/dL   GFR calc non Af Amer >90  >90 mL/min   GFR calc Af Amer >90  >90 mL/min   Anion gap 13  5 - 15  URINALYSIS, ROUTINE W REFLEX MICROSCOPIC     Status: Abnormal   Collection Time    04/12/14 12:15 AM      Result Value Ref Range   Color, Urine YELLOW  YELLOW   APPearance CLEAR  CLEAR   Specific Gravity, Urine 1.020  1.005 - 1.030   pH 6.0  5.0 - 8.0   Glucose, UA NEGATIVE  NEGATIVE mg/dL   Hgb urine dipstick NEGATIVE  NEGATIVE   Bilirubin Urine NEGATIVE  NEGATIVE   Ketones, ur NEGATIVE  NEGATIVE mg/dL   Protein, ur NEGATIVE  NEGATIVE mg/dL   Urobilinogen, UA 0.2  0.0 - 1.0 mg/dL   Nitrite NEGATIVE  NEGATIVE   Leukocytes, UA SMALL (*) NEGATIVE  PREGNANCY, URINE     Status: None   Collection Time    04/12/14 12:15 AM      Result Value Ref Range   Preg Test, Ur NEGATIVE  NEGATIVE  URINE MICROSCOPIC-ADD ON     Status: Abnormal   Collection Time    04/12/14 12:15 AM      Result Value Ref Range   Squamous Epithelial / LPF FEW (*) RARE   WBC, UA 0-2  <3 WBC/hpf   Bacteria, UA FEW (*) RARE   Crystals CA OXALATE CRYSTALS (*) NEGATIVE   Urine-Other MUCOUS PRESENT      Imaging Studies: Ct Abdomen Pelvis W Contrast  04/12/2014   CLINICAL DATA:  Right lower quadrant abdominal pain for 1 week. History nausea, vomiting and diarrhea. History of chills. Initial encounter.  EXAM: CT ABDOMEN AND PELVIS WITH CONTRAST  TECHNIQUE: Multidetector CT imaging of the abdomen and pelvis was performed using the standard protocol following bolus administration of intravenous contrast.  CONTRAST:  50mL OMNIPAQUE IOHEXOL 300 MG/ML SOLN, OMNIPAQUE IOHEXOL 300 MG/ML SOLN  COMPARISON:  04/01/2021 ; 01/16/2014   FINDINGS: Normal hepatic contour. Minimal amount of focal fatty infiltration adjacent to the fissure for ligamentum teres. Normal appearance of the gallbladder. No radiopaque gallstones. No intra or extrahepatic biliary duct dilatation. No ascites.  There is symmetric enhancement of the bilateral kidneys. No definite renal stones in this postcontrast examination. No discrete renal lesions. No urinary obstruction or perinephric stranding. Normal appearance of the bilateral adrenal glands, pancreas and spleen.  Ingested enteric contrast extends to the level of the mid small bowel. The bowel is normal in course and caliber without wall thickening or evidence of obstruction. Normal appearance of the appendix. No pneumoperitoneum, pneumatosis or portal venous gas.  Normal caliber the abdominal aorta. The major branch vessels of the abdominal aorta appear widely patent on this non CTA examination. No retroperitoneal, mesenteric, pelvic or inguinal lymphadenopathy.  There is a minimal amount of physiologic fluid within the endometrial canal. Note is made of an approximately 3.5 x 3.1 cm hypo attenuating (17 Hounsfield unit) left-sided presumably physiologic adnexal cyst (image 69, series 2). No discrete right-sided adnexal lesions. No free fluid in the pelvic cul-de-sac.  Normal appearance of the urinary bladder given degree distention.  Evaluation of lower thorax is degraded secondary to patient respiratory artifact. Minimal subsegmental atelectasis  within the imaged deep tendon portion of the left lower lobe. No discrete focal airspace opacities. No pleural effusion. Normal heart size. No pericardial effusion.  No acute or aggressive osseus abnormalities. A limbus body is noted about the anterior aspect of the superior endplate of the L4 vertebral body.  Small mesenteric fat containing periumbilical hernia.  IMPRESSION: 1. No explanation for patient's right lower quadrant abdominal pain. Specifically, no evidence of  enteric or urinary obstruction. Normal appearance of the appendix. 2. Incidental note made of an approximately 3.5 cm presumably physiologic left-sided adnexal cyst.   Electronically Signed   By: Simonne ComeJohn  Watts M.D.   On: 04/12/2014 02:00   The patient will followup with her primary care physician, Dr. [place]elon [place]unders, [place].  I personally performed the services described in this documentation, which was scribed in my presence. The recorded information has been reviewed and is accurate.    Hanley SeamenJohn L Vernita Tague, MD 04/12/14 [phone]

## 2014-04-11 NOTE — ED Notes (Signed)
Patient reports diarrhea, vomiting, and cough since yesterday.

## 2014-04-12 ENCOUNTER — Emergency Department (HOSPITAL_COMMUNITY): Payer: Medicare Other

## 2014-04-12 DIAGNOSIS — N830 Follicular cyst of ovary: Secondary | ICD-10-CM | POA: Diagnosis not present

## 2014-04-12 LAB — URINALYSIS, ROUTINE W REFLEX MICROSCOPIC
Bilirubin Urine: NEGATIVE
Glucose, UA: NEGATIVE mg/dL
Hgb urine dipstick: NEGATIVE
Ketones, ur: NEGATIVE mg/dL
Nitrite: NEGATIVE
Protein, ur: NEGATIVE mg/dL
Specific Gravity, Urine: 1.02 (ref 1.005–1.030)
Urobilinogen, UA: 0.2 mg/dL (ref 0.0–1.0)
pH: 6 (ref 5.0–8.0)

## 2014-04-12 LAB — COMPREHENSIVE METABOLIC PANEL
ALT: 14 U/L (ref 0–35)
AST: 16 U/L (ref 0–37)
Albumin: 4.2 g/dL (ref 3.5–5.2)
Alkaline Phosphatase: 61 U/L (ref 39–117)
Anion gap: 13 (ref 5–15)
BUN: 5 mg/dL — ABNORMAL LOW (ref 6–23)
CO2: 24 mEq/L (ref 19–32)
Calcium: 9.8 mg/dL (ref 8.4–10.5)
Chloride: 101 mEq/L (ref 96–112)
Creatinine, Ser: 0.86 mg/dL (ref 0.50–1.10)
GFR calc Af Amer: >90 mL/min (ref >90)
GFR calc non Af Amer: >90 mL/min (ref >90)
Glucose, Bld: 94 mg/dL (ref 70–99)
Potassium: 4.1 mEq/L (ref 3.7–5.3)
Sodium: 138 mEq/L (ref 137–147)
Total Bilirubin: 0.2 mg/dL — ABNORMAL LOW (ref 0.3–1.2)
Total Protein: 7.6 g/dL (ref 6.0–8.3)

## 2014-04-12 LAB — URINE MICROSCOPIC-ADD ON

## 2014-04-12 LAB — PREGNANCY, URINE: Preg Test, Ur: NEGATIVE

## 2014-04-12 MED ORDER — OXYCODONE-ACETAMINOPHEN 5-325 MG PO TABS: 1.0000 | ORAL_TABLET | Freq: Four times a day (QID) | ORAL | Status: DC | PRN

## 2014-04-12 MED ORDER — FENTANYL CITRATE 0.05 MG/ML IJ SOLN
100.0000 ug | Freq: Once | INTRAMUSCULAR | Status: AC
  Administered 2014-04-12: 100 ug via INTRAVENOUS
  Filled 2014-04-12: qty 2

## 2014-04-12 MED ORDER — IOHEXOL 300 MG/ML  SOLN
50.0000 mL | Freq: Once | INTRAMUSCULAR | Status: AC | PRN
  Administered 2014-04-12: 50 mL via ORAL

## 2014-04-12 MED ORDER — MORPHINE SULFATE 4 MG/ML IJ SOLN
4.0000 mg | Freq: Once | INTRAMUSCULAR | Status: AC
  Administered 2014-04-12: 4 mg via INTRAVENOUS
  Filled 2014-04-12: qty 1

## 2014-04-12 MED ORDER — ALBUTEROL SULFATE HFA 108 (90 BASE) MCG/ACT IN AERS
2.0000 | INHALATION_SPRAY | RESPIRATORY_TRACT | Status: DC | PRN
  Administered 2014-04-12: 2 via RESPIRATORY_TRACT
  Filled 2014-04-12: qty 6.7

## 2014-04-12 MED ORDER — ONDANSETRON 4 MG PO TBDP: 4.0000 mg | ORAL_TABLET | Freq: Three times a day (TID) | ORAL | Status: DC | PRN

## 2014-04-12 MED ORDER — PROMETHAZINE HCL 25 MG/ML IJ SOLN
12.5000 mg | Freq: Once | INTRAMUSCULAR | Status: AC
  Administered 2014-04-12: 12.5 mg via INTRAVENOUS
  Filled 2014-04-12: qty 1

## 2014-04-12 MED ORDER — PROMETHAZINE HCL 25 MG/ML IJ SOLN
INTRAMUSCULAR | Status: AC
  Filled 2014-04-12: qty 1

## 2014-04-12 MED ORDER — FENTANYL CITRATE 0.05 MG/ML IJ SOLN
INTRAMUSCULAR | Status: AC
  Filled 2014-04-12: qty 2

## 2014-04-12 MED ORDER — IOHEXOL 300 MG/ML  SOLN
100.0000 mL | Freq: Once | INTRAMUSCULAR | Status: AC | PRN
  Administered 2014-04-12: 100 mL via INTRAVENOUS

## 2014-04-12 NOTE — ED Notes (Signed)
Patient complaining of pelvic pain and pressure, also states that she is hurting in her side that the medication made her sleepy but did not relieve the pain. Patient states that she would like to see the MD.

## 2014-04-12 NOTE — ED Notes (Signed)
S/O states that she needs more pain medication.

## 2014-04-12 NOTE — ED Notes (Signed)
Patient states that prior medication did nothing to help her, just made her sleepy. States that she has never had morphine before.

## 2014-04-12 NOTE — ED Notes (Signed)
MD notified of patient's complaints and that she would like to see him.

## 2014-04-12 NOTE — ED Notes (Signed)
Patient states that she is having a flushed sensation from the medication. States that the feeling eased off.

## 2014-04-25 MED FILL — Oxycodone w/ Acetaminophen Tab 5-325 MG: ORAL | Qty: 6 | Status: AC

## 2014-04-25 MED FILL — Ondansetron HCl Tab 4 MG: ORAL | Qty: 4 | Status: AC

## 2014-05-02 ENCOUNTER — Encounter (HOSPITAL_COMMUNITY): Payer: Self-pay | Admitting: Emergency Medicine

## 2014-05-03 ENCOUNTER — Encounter (HOSPITAL_COMMUNITY): Payer: Self-pay | Admitting: *Deleted

## 2014-05-03 ENCOUNTER — Emergency Department (HOSPITAL_COMMUNITY)
Admission: EM | Admit: 2014-05-03 | Discharge: 2014-05-03 | Disposition: A | Discharge status: Home / Self Care | Payer: Medicare Other | Attending: Emergency Medicine | Admitting: Emergency Medicine

## 2014-05-03 DIAGNOSIS — Z3202 Encounter for pregnancy test, result negative: Secondary | ICD-10-CM | POA: Insufficient documentation

## 2014-05-03 DIAGNOSIS — Z792 Long term (current) use of antibiotics: Secondary | ICD-10-CM | POA: Diagnosis not present

## 2014-05-03 DIAGNOSIS — R1013 Epigastric pain: Secondary | ICD-10-CM | POA: Insufficient documentation

## 2014-05-03 DIAGNOSIS — Z79899 Other long term (current) drug therapy: Secondary | ICD-10-CM | POA: Insufficient documentation

## 2014-05-03 DIAGNOSIS — R1011 Right upper quadrant pain: Secondary | ICD-10-CM | POA: Insufficient documentation

## 2014-05-03 DIAGNOSIS — Z720 Tobacco use: Secondary | ICD-10-CM | POA: Diagnosis not present

## 2014-05-03 DIAGNOSIS — Z87442 Personal history of urinary calculi: Secondary | ICD-10-CM | POA: Diagnosis not present

## 2014-05-03 DIAGNOSIS — K219 Gastro-esophageal reflux disease without esophagitis: Secondary | ICD-10-CM | POA: Diagnosis not present

## 2014-05-03 DIAGNOSIS — M549 Dorsalgia, unspecified: Secondary | ICD-10-CM | POA: Diagnosis present

## 2014-05-03 DIAGNOSIS — Z8619 Personal history of other infectious and parasitic diseases: Secondary | ICD-10-CM | POA: Diagnosis not present

## 2014-05-03 DIAGNOSIS — F319 Bipolar disorder, unspecified: Secondary | ICD-10-CM | POA: Diagnosis not present

## 2014-05-03 DIAGNOSIS — Z87440 Personal history of urinary (tract) infections: Secondary | ICD-10-CM | POA: Insufficient documentation

## 2014-05-03 DIAGNOSIS — Z8742 Personal history of other diseases of the female genital tract: Secondary | ICD-10-CM | POA: Diagnosis not present

## 2014-05-03 DIAGNOSIS — G8929 Other chronic pain: Secondary | ICD-10-CM | POA: Diagnosis not present

## 2014-05-03 DIAGNOSIS — R109 Unspecified abdominal pain: Secondary | ICD-10-CM

## 2014-05-03 DIAGNOSIS — E669 Obesity, unspecified: Secondary | ICD-10-CM | POA: Insufficient documentation

## 2014-05-03 LAB — URINALYSIS, ROUTINE W REFLEX MICROSCOPIC
Bilirubin Urine: NEGATIVE
Glucose, UA: NEGATIVE mg/dL
Hgb urine dipstick: NEGATIVE
Ketones, ur: NEGATIVE mg/dL
Nitrite: NEGATIVE
Protein, ur: NEGATIVE mg/dL
Specific Gravity, Urine: 1.01 (ref 1.005–1.030)
Urobilinogen, UA: 0.2 mg/dL (ref 0.0–1.0)
pH: 7 (ref 5.0–8.0)

## 2014-05-03 LAB — COMPREHENSIVE METABOLIC PANEL
ALT: 15 U/L (ref 0–35)
AST: 19 U/L (ref 0–37)
Albumin: 4 g/dL (ref 3.5–5.2)
Alkaline Phosphatase: 69 U/L (ref 39–117)
Anion gap: 11 (ref 5–15)
BUN: 3 mg/dL — ABNORMAL LOW (ref 6–23)
CO2: 28 mEq/L (ref 19–32)
Calcium: 9.3 mg/dL (ref 8.4–10.5)
Chloride: 101 mEq/L (ref 96–112)
Creatinine, Ser: 0.83 mg/dL (ref 0.50–1.10)
GFR calc Af Amer: >90 mL/min (ref >90)
GFR calc non Af Amer: >90 mL/min (ref >90)
Glucose, Bld: 105 mg/dL — ABNORMAL HIGH (ref 70–99)
Potassium: 4.1 mEq/L (ref 3.7–5.3)
Sodium: 140 mEq/L (ref 137–147)
Total Bilirubin: 0.2 mg/dL — ABNORMAL LOW (ref 0.3–1.2)
Total Protein: 7.2 g/dL (ref 6.0–8.3)

## 2014-05-03 LAB — URINE MICROSCOPIC-ADD ON

## 2014-05-03 LAB — CBC WITH DIFFERENTIAL/PLATELET
Basophils Absolute: 0 10*3/uL (ref 0.0–0.1)
Basophils Relative: 0 % (ref 0–1)
Eosinophils Absolute: 0.5 10*3/uL (ref 0.0–0.7)
Eosinophils Relative: 5 % (ref 0–5)
HCT: 41.1 % (ref 36.0–46.0)
Hemoglobin: 13.8 g/dL (ref 12.0–15.0)
Lymphocytes Relative: 27 % (ref 12–46)
Lymphs Abs: 2.4 10*3/uL (ref 0.7–4.0)
MCH: 30.9 pg (ref 26.0–34.0)
MCHC: 33.6 g/dL (ref 30.0–36.0)
MCV: 92.2 fL (ref 78.0–100.0)
Monocytes Absolute: 0.7 10*3/uL (ref 0.1–1.0)
Monocytes Relative: 8 % (ref 3–12)
Neutro Abs: 5.5 10*3/uL (ref 1.7–7.7)
Neutrophils Relative %: 60 % (ref 43–77)
Platelets: 272 10*3/uL (ref 150–400)
RBC: 4.46 MIL/uL (ref 3.87–5.11)
RDW: 13.5 % (ref 11.5–15.5)
WBC: 9.2 10*3/uL (ref 4.0–10.5)

## 2014-05-03 LAB — LIPASE, BLOOD: Lipase: 28 U/L (ref 11–59)

## 2014-05-03 LAB — POC URINE PREG, ED: Preg Test, Ur: NEGATIVE

## 2014-05-03 MED ORDER — MORPHINE SULFATE 4 MG/ML IJ SOLN
4.0000 mg | Freq: Once | INTRAMUSCULAR | Status: AC
  Administered 2014-05-03: 4 mg via INTRAVENOUS
  Filled 2014-05-03: qty 1

## 2014-05-03 MED ORDER — ONDANSETRON HCL 4 MG/2ML IJ SOLN: 4.0000 mg | Freq: Once | INTRAMUSCULAR | Status: DC

## 2014-05-03 MED ORDER — GI COCKTAIL ~~LOC~~
30.0000 mL | Freq: Once | ORAL | Status: AC
  Administered 2014-05-03: 30 mL via ORAL
  Filled 2014-05-03: qty 30

## 2014-05-03 MED ORDER — OXYCODONE-ACETAMINOPHEN 5-325 MG PO TABS
1.0000 | ORAL_TABLET | Freq: Once | ORAL | Status: AC
  Administered 2014-05-03: 1 via ORAL
  Filled 2014-05-03: qty 1

## 2014-05-03 MED ORDER — DIPHENHYDRAMINE HCL 50 MG/ML IJ SOLN
25.0000 mg | Freq: Once | INTRAMUSCULAR | Status: AC
  Administered 2014-05-03: 25 mg via INTRAVENOUS
  Filled 2014-05-03: qty 1

## 2014-05-03 MED ORDER — METOCLOPRAMIDE HCL 5 MG/ML IJ SOLN
10.0000 mg | Freq: Once | INTRAMUSCULAR | Status: AC
  Administered 2014-05-03: 10 mg via INTRAVENOUS
  Filled 2014-05-03: qty 2

## 2014-05-03 MED ORDER — OXYCODONE-ACETAMINOPHEN 5-325 MG PO TABS: 1.0000 | ORAL_TABLET | ORAL | Status: DC | PRN

## 2014-05-03 NOTE — ED Notes (Signed)
Pt reporting mid back pain & chest pain. Pt states has some nausea also.

## 2014-05-03 NOTE — ED Notes (Signed)
Patient asleep and snoring at this time.

## 2014-05-03 NOTE — ED Provider Notes (Signed)
CSN: [id number]     Arrival date & time 05/03/14  0019 History   First MD Initiated Contact with Patient 05/03/14 0139     Chief Complaint  Patient presents with  . Back Pain     (Consider location/radiation/quality/duration/timing/severity/associated sxs/prior Treatment) Patient is a 23 y.o. female presenting with back pain. The history is provided by the patient.  Back Pain She states that she has been having sharp epigastric pain for about the last 3 hours. There is also pain in her upper back. She has chronic pain in her upper back minute this does seem to be worse. There is associated nausea but no vomiting. She feels better she is in a certain position-on her right side with her hips and knees flexed. Nothing makes her pain worse. She has been having diarrhea for the last week and that is not any different today. She denies fever, chills, sweats. She denies arthralgias or myalgias. She has not taken anything for pain. She rates pain at 8/10. She does relate a history of an ovarian cyst.she does state that she is under the care of a gastroenterologist who states that her problem is that she is not digesting her food and has her on some medication. She is not sure what the medication is. Her description sounds like sucralfate.  Past Medical History  Diagnosis Date  . GERD (gastroesophageal reflux disease)   . Bipolar 1 disorder   . Depression   . Headache(784.0)   . Urinary tract infection   . Kidney stones   . Carpal tunnel syndrome on both sides     with preg  . Bipolar disorder   . Abnormal Pap smear   . Vaginal discharge 03/11/2013  . RLQ abdominal pain 03/11/2013  . BV (bacterial vaginosis) 03/11/2013  . Ovarian cyst, right   . Obesity 06/17/2013  . Unspecified symptom associated with female genital organs 01/06/2014   Past Surgical History  Procedure Laterality Date  . Tonsillectomy and adenoidectomy Bilateral   . Tonsillectomy    . Myringotomy     Family History  Problem  Relation Age of Onset  . Hypertension Mother   . Hypertension Father   . Hypertension Maternal Aunt   . Hypertension Maternal Uncle   . Hypertension Paternal Aunt   . Hypertension Paternal Uncle   . Diabetes Maternal Grandfather   . Diabetes Paternal Grandmother   . Other Son     swallowing problems   History  Substance Use Topics  . Smoking status: Light Tobacco Smoker -- 0.25 packs/day for 1 years    Types: Cigarettes    Last Attempt to Quit: 04/19/2012  . Smokeless tobacco: Never Used  . Alcohol Use: No   OB History    Gravida Para Term Preterm AB TAB SAB Ectopic Multiple Living   1 1 1  0 0 0 0 0 0 1     Review of Systems  Musculoskeletal: Positive for back pain.  All other systems reviewed and are negative.     Allergies  Hydrocodone  Home Medications   Prior to Admission medications   Medication Sig Start Date End Date Taking? Authorizing Provider  gabapentin (NEURONTIN) 100 MG capsule Take 100 mg by mouth daily.   Yes Historical Provider, MD  omeprazole (PRILOSEC) 20 MG capsule Take 20 mg by mouth daily.   Yes Historical Provider, MD  ondansetron (ZOFRAN ODT) 4 MG disintegrating tablet Take 1 tablet (4 mg total) by mouth every 8 (eight) hours as needed for nausea  or vomiting. 4mg  ODT q4 hours prn nausea/vomit 04/12/14  Yes John L Molpus, MD  ondansetron (ZOFRAN ODT) 4 MG disintegrating tablet Take 1 tablet (4 mg total) by mouth every 8 (eight) hours as needed for nausea or vomiting. 4mg  ODT q4 hours prn nausea/vomit 04/12/14  Yes John L Molpus, MD  Oxcarbazepine (TRILEPTAL) 300 MG tablet Take 300 mg by mouth 2 (two) times daily.   Yes Historical Provider, MD  PARoxetine (PAXIL) 20 MG tablet Take 20 mg by mouth daily. 09/02/13  Yes Historical Provider, MD  traZODone (DESYREL) 100 MG tablet Take 100 mg by mouth at bedtime.   Yes Historical Provider, MD  cephALEXin (KEFLEX) 500 MG capsule Take 1 capsule (500 mg total) by mouth 4 (four) times daily. 04/01/14   Hope Orlene Och, NP  ibuprofen (ADVIL,MOTRIN) 200 MG tablet Take 200 mg by mouth every 6 (six) hours as needed for moderate pain.    Historical Provider, MD  oxyCODONE-acetaminophen (PERCOCET) 5-325 MG per tablet Take 1-2 tablets by mouth every 6 (six) hours as needed (for pain). 04/12/14   Carlisle Beers Molpus, MD  oxyCODONE-acetaminophen (PERCOCET) 5-325 MG per tablet Take 1-2 tablets by mouth every 6 (six) hours as needed (for pain). 04/12/14   Carlisle Beers Molpus, MD  phenazopyridine (PYRIDIUM) 200 MG tablet Take 1 tablet (200 mg total) by mouth 3 (three) times daily. 04/01/14   Hope Orlene Och, NP   BP 131/86 mmHg  Pulse 93  Temp(Src) 98.1 F (36.7 C) (Oral)  Resp 20  Ht 5' (1.524 m)  Wt 178 lb (80.74 kg)  BMI 34.76 kg/m2  SpO2 98%  LMP 04/26/2014 Physical Exam  Nursing note and vitals reviewed.  Morbidly obese 23 year old female, resting comfortably and in no acute distress. Vital signs are normal. Oxygen saturation is 98%, which is normal. Head is normocephalic and atraumatic. PERRLA, EOMI. Oropharynx is clear. Neck is nontender and supple without adenopathy or JVD. Back is moderately tender in the region around the right scapula. There is no CVA tenderness. Lungs are clear without rales, wheezes, or rhonchi. Chest is nontender. Heart has regular rate and rhythm without murmur. Abdomen is soft, flat, nontender with moderate epigastric and right upper quadrant tenderness. There is a plus/minus Murphy sign. There are no masses or hepatosplenomegaly and peristalsis is hypoactive. Extremities have no cyanosis or edema, full range of motion is present. Skin is warm and dry without rash. Neurologic: Mental status is normal, cranial nerves are intact, there are no motor or sensory deficits.  ED Course  Procedures (including critical care time) Labs Review Results for orders placed or performed during the hospital encounter of 05/03/14  CBC with Differential  Result Value Ref Range   WBC 9.2 4.0 - 10.5 K/uL    RBC 4.46 3.87 - 5.11 MIL/uL   Hemoglobin 13.8 12.0 - 15.0 g/dL   HCT 40.9 81.1 - 91.4 %   MCV 92.2 78.0 - 100.0 fL   MCH 30.9 26.0 - 34.0 pg   MCHC 33.6 30.0 - 36.0 g/dL   RDW 78.2 95.6 - 21.3 %   Platelets 272 150 - 400 K/uL   Neutrophils Relative % 60 43 - 77 %   Neutro Abs 5.5 1.7 - 7.7 K/uL   Lymphocytes Relative 27 12 - 46 %   Lymphs Abs 2.4 0.7 - 4.0 K/uL   Monocytes Relative 8 3 - 12 %   Monocytes Absolute 0.7 0.1 - 1.0 K/uL   Eosinophils Relative 5 0 -  5 %   Eosinophils Absolute 0.5 0.0 - 0.7 K/uL   Basophils Relative 0 0 - 1 %   Basophils Absolute 0.0 0.0 - 0.1 K/uL  Comprehensive metabolic panel  Result Value Ref Range   Sodium 140 137 - 147 mEq/L   Potassium 4.1 3.7 - 5.3 mEq/L   Chloride 101 96 - 112 mEq/L   CO2 28 19 - 32 mEq/L   Glucose, Bld 105 (H) 70 - 99 mg/dL   BUN 3 (L) 6 - 23 mg/dL   Creatinine, Ser 2.130.83 0.50 - 1.10 mg/dL   Calcium 9.3 8.4 - 08.610.5 mg/dL   Total Protein 7.2 6.0 - 8.3 g/dL   Albumin 4.0 3.5 - 5.2 g/dL   AST 19 0 - 37 U/L   ALT 15 0 - 35 U/L   Alkaline Phosphatase 69 39 - 117 U/L   Total Bilirubin 0.2 (L) 0.3 - 1.2 mg/dL   GFR calc non Af Amer >90 >90 mL/min   GFR calc Af Amer >90 >90 mL/min   Anion gap 11 5 - 15  Lipase, blood  Result Value Ref Range   Lipase 28 11 - 59 U/L  Urinalysis, Routine w reflex microscopic  Result Value Ref Range   Color, Urine STRAW (A) YELLOW   APPearance CLEAR CLEAR   Specific Gravity, Urine 1.010 1.005 - 1.030   pH 7.0 5.0 - 8.0   Glucose, UA NEGATIVE NEGATIVE mg/dL   Hgb urine dipstick NEGATIVE NEGATIVE   Bilirubin Urine NEGATIVE NEGATIVE   Ketones, ur NEGATIVE NEGATIVE mg/dL   Protein, ur NEGATIVE NEGATIVE mg/dL   Urobilinogen, UA 0.2 0.0 - 1.0 mg/dL   Nitrite NEGATIVE NEGATIVE   Leukocytes, UA MODERATE (A) NEGATIVE  Urine microscopic-add on  Result Value Ref Range   Squamous Epithelial / LPF MANY (A) RARE   WBC, UA 7-10 <3 WBC/hpf   RBC / HPF 0-2 <3 RBC/hpf   Bacteria, UA MANY (A) RARE   POC urine preg, [facility]  Result Value Ref Range   Preg Test, Ur NEGATIVE NEGATIVE   MDM   Final diagnoses:  Abdominal pain, unspecified abdominal location  Upper back pain    Epigastric and right upper quadrant pain. This may be part of gastroesophageal reflux disease and peptic ulcer disease. Consider possibility of biliary colic. Back pain is probably musculoskeletal. Screening labs are sent. Old records reviewed and she has multiple visits for her ovarian cyst but no other relevant visits.  Laboratory workup is unremarkable. She does have mild pyuria and bacteriuria but on a contaminated specimen with no urinary symptoms to suggest actual urinary tract infection. She was given a small dose of morphine with slight improvement. She is discharged with prescription for oxycodone-acetaminophen and is referred back to her PCP and gastroenterologist.  Dione Boozeavid Jaquil Todt, MD 05/03/14 [phone]

## 2014-05-03 NOTE — ED Notes (Signed)
Pt alert & oriented x4, stable gait. Patient given discharge instructions, paperwork & prescription(s). Patient  instructed to stop at the registration desk to finish any additional paperwork. Patient verbalized understanding. Pt left department w/ no further questions. 

## 2014-05-03 NOTE — ED Notes (Signed)
Pt states the rash is improving, not as red & whelps not as raised.

## 2014-05-03 NOTE — ED Notes (Signed)
Pt states mid back pain that radiates to the chest. Pt reports nausea, has meds for chronic nausea. States she may be drowsy from taking her trazodone before coming to the ER but is in so much pain unable to sleep.

## 2014-05-03 NOTE — Discharge Instructions (Signed)
Abdominal Pain Many things can cause abdominal pain. Usually, abdominal pain is not caused by a disease and will improve without treatment. It can often be observed and treated at home. Your health care provider will do a physical exam and possibly order blood tests and X-rays to help determine the seriousness of your pain. However, in many cases, more time must pass before a clear cause of the pain can be found. Before that point, your health care provider may not know if you need more testing or further treatment. HOME CARE INSTRUCTIONS  Monitor your abdominal pain for any changes. The following actions may help to alleviate any discomfort you are experiencing:  Only take over-the-counter or prescription medicines as directed by your health care provider.  Do not take laxatives unless directed to do so by your health care provider.  Try a clear liquid diet (broth, tea, or water) as directed by your health care provider. Slowly move to a bland diet as tolerated. SEEK MEDICAL CARE IF:  You have unexplained abdominal pain.  You have abdominal pain associated with nausea or diarrhea.  You have pain when you urinate or have a bowel movement.  You experience abdominal pain that wakes you in the night.  You have abdominal pain that is worsened or improved by eating food.  You have abdominal pain that is worsened with eating fatty foods.  You have a fever. SEEK IMMEDIATE MEDICAL CARE IF:   Your pain does not go away within 2 hours.  You keep throwing up (vomiting).  Your pain is felt only in portions of the abdomen, such as the right side or the left lower portion of the abdomen.  You pass bloody or black tarry stools. MAKE SURE YOU:  Understand these instructions.   Will watch your condition.   Will get help right away if you are not doing well or get worse.  Document Released: 03/27/2005 Document Revised: 06/22/2013 Document Reviewed: 02/24/2013 [facility] Patient Information  2015 [place], [place]. This information is not intended to replace advice given to you by your health care provider. Make sure you discuss any questions you have with your health care provider.  Back Pain, Adult Low back pain is very common. About 1 in 5 people have back pain.The cause of low back pain is rarely dangerous. The pain often gets better over time.About half of people with a sudden onset of back pain feel better in just 2 weeks. About 8 in 10 people feel better by 6 weeks.  CAUSES Some common causes of back pain include:  Strain of the muscles or ligaments supporting the spine.  Wear and tear (degeneration) of the spinal discs.  Arthritis.  Direct injury to the back. DIAGNOSIS Most of the time, the direct cause of low back pain is not known.However, back pain can be treated effectively even when the exact cause of the pain is unknown.Answering your caregiver's questions about your overall health and symptoms is one of the most accurate ways to make sure the cause of your pain is not dangerous. If your caregiver needs more information, he or she may order lab work or imaging tests (X-rays or MRIs).However, even if imaging tests show changes in your back, this usually does not require surgery. HOME CARE INSTRUCTIONS For many people, back pain returns.Since low back pain is rarely dangerous, it is often a condition that people can learn to [facility] their own.   Remain active. It is stressful on the back to sit or stand in  one place. Do not sit, drive, or stand in one place for more than 30 minutes at a time. Take short walks on level surfaces as soon as pain allows.Try to increase the length of time you walk each day.  Do not stay in bed.Resting more than 1 or 2 days can delay your recovery.  Do not avoid exercise or work.Your body is made to move.It is not dangerous to be active, even though your back may hurt.Your back will likely heal faster if you return to being active  before your pain is gone.  Pay attention to your body when you bend and lift. Many people have less discomfortwhen lifting if they bend their knees, keep the load close to their bodies,and avoid twisting. Often, the most comfortable positions are those that put less stress on your recovering back.  Find a comfortable position to sleep. Use a firm mattress and lie on your side with your knees slightly bent. If you lie on your back, put a pillow under your knees.  Only take over-the-counter or prescription medicines as directed by your caregiver. Over-the-counter medicines to reduce pain and inflammation are often the most helpful.Your caregiver may prescribe muscle relaxant drugs.These medicines help dull your pain so you can more quickly return to your normal activities and healthy exercise.  Put ice on the injured area.  Put ice in a plastic bag.  Place a towel between your skin and the bag.  Leave the ice on for 15-20 minutes, 03-04 times a day for the first 2 to 3 days. After that, ice and heat may be alternated to reduce pain and spasms.  Ask your caregiver about trying back exercises and gentle massage. This may be of some benefit.  Avoid feeling anxious or stressed.Stress increases muscle tension and can worsen back pain.It is important to recognize when you are anxious or stressed and learn ways to manage it.Exercise is a great option. SEEK MEDICAL CARE IF:  You have pain that is not relieved with rest or medicine.  You have pain that does not improve in 1 week.  You have new symptoms.  You are generally not feeling well. SEEK IMMEDIATE MEDICAL CARE IF:   You have pain that radiates from your back into your legs.  You develop new bowel or bladder control problems.  You have unusual weakness or numbness in your arms or legs.  You develop nausea or vomiting.  You develop abdominal pain.  You feel faint. Document Released: 06/17/2005 Document Revised: 12/17/2011  Document Reviewed: 10/19/2013 [facility] Patient Information 2015 [place]. This information is not intended to replace advice given to you by your health care provider. Make sure you discuss any questions you have with your health care provider.  Acetaminophen; Oxycodone capsules What is this medicine? ACETAMINOPHEN; OXYCODONE (a set a MEE noe fen; ox i KOE done) is a pain reliever. It is used to treat mild to moderate pain. This medicine may be used for other purposes; ask your health care provider or pharmacist if you have questions. COMMON BRAND NAME(S): Tylox What should I tell my health care provider before I take this medicine? They need to know if you have any of these conditions: -brain tumor -Crohn's disease, inflammatory bowel disease, or ulcerative colitis -drug abuse or addiction -head injury -heart or circulation problems -if you often drink alcohol -kidney disease or problems going to the bathroom -liver disease -lung disease, asthma, or breathing problems -an unusual or allergic reaction to salicylates, acetaminophen, oxycodone, other  opioid analgesics, other medicines, foods, dyes, or preservatives -pregnant or trying to get pregnant -breast-feeding How should I use this medicine? Take this medicine by mouth with a full glass of water. Follow the directions on the prescription label. If the medicine upsets your stomach, take it with food or milk. Take your medicine at regular intervals. Do not take your medicine more often than directed. Talk to your pediatrician regarding the use of this medicine in children. Special care may be needed. Patients over 49 years old may have a stronger reaction and need a smaller dose. Overdosage: If you think you have taken too much of this medicine contact a poison control center or emergency room at once. NOTE: This medicine is only for you. Do not share this medicine with others. What if I miss a dose? If you miss a dose, take it  as soon as you can. If it is almost time for your next dose, take only that dose. Do not take double or extra doses. What may interact with this medicine? -alcohol -antihistamines -barbiturates like amobarbital, butalbital, butabarbital, methohexital, pentobarbital, phenobarbital, thiopental, and secobarbital -benztropine -drugs for bladder problems like solifenacin, trospium, oxybutynin, tolterodine, hyoscyamine, and methscopolamine -drugs for breathing problems like ipratropium and tiotropium -drugs for certain stomach or intestine problems like propantheline, homatropine methylbromide, glycopyrrolate, atropine, belladonna, and dicyclomine -general anesthetics like etomidate, ketamine, nitrous oxide, propofol, desflurane, enflurane, halothane, isoflurane, and sevoflurane -medicines for depression, anxiety, or psychotic disturbances -medicines for sleep -muscle relaxants -naltrexone -narcotic medicines (opiates) for pain -phenothiazines like perphenazine, thioridazine, chlorpromazine, mesoridazine, fluphenazine, prochlorperazine, promazine, and trifluoperazine -scopolamine -tramadol -trihexyphenidyl This list may not describe all possible interactions. Give your health care provider a list of all the medicines, herbs, non-prescription drugs, or dietary supplements you use. Also tell them if you smoke, drink alcohol, or use illegal drugs. Some items may interact with your medicine. What should I watch for while using this medicine? Tell your doctor or health care professional if your pain does not go away, if it gets worse, or if you have new or a different type of pain. You may develop tolerance to the medicine. Tolerance means that you will need a higher dose of the medication for pain relief. Tolerance is normal and is expected if you take this medicine for a long time. Do not suddenly stop taking your medicine because you may develop a severe reaction. Your body becomes used to the  medicine. This does NOT mean you are addicted. Addiction is a behavior related to getting and using a drug for a nonmedical reason. If you have pain, you have a medical reason to take pain medicine. Your doctor will tell you how much medicine to take. If your doctor wants you to stop the medicine, the dose will be slowly lowered over time to avoid any side effects. You may get drowsy or dizzy. Do not drive, use machinery, or do anything that needs mental alertness until you know how this medicine affects you. Do not stand or sit up quickly, especially if you are an older patient. This reduces the risk of dizzy or fainting spells. Alcohol may interfere with the effect of this medicine. Avoid alcoholic drinks. There are different types of narcotic medicines (opiates) for pain. If you take more than one type at the same time, you may have more side effects. Give your health care provider a list of all medicines you use. Your doctor will tell you how much medicine to take. Do not take more medicine  than directed. Call emergency for help if you have problems breathing. The medicine will cause constipation. Try to have a bowel movement at least every 2 to 3 days. If you do not have a bowel movement for 3 days, call your doctor or health care professional. Do not take Tylenol (acetaminophen) or medicines that have acetaminophen with this medicine. Too much acetaminophen can be very dangerous. Many non-prescription medicines contain acetaminophen. Always read the labels carefully to avoid taking more acetaminophen. What side effects may I notice from receiving this medicine? Side effects that you should report to your doctor or health care professional as soon as possible: -allergic reactions like skin rash, itching or hives, swelling of the face, lips, or tongue -breathing difficulties, wheezing -confusion -light headedness or fainting spells -severe stomach pain -unusually weak or tired -yellowing of the skin  or the whites of the eyes Side effects that usually do not require medical attention (report to your doctor or health care professional if they continue or are bothersome): -dizziness -drowsiness -nausea -vomiting This list may not describe all possible side effects. Call your doctor for medical advice about side effects. You may report side effects to FDA at 1-800-FDA-1088. Where should I keep my medicine? Keep out of the reach of children. This medicine can be abused. Keep your medicine in a safe place to protect it from theft. Do not share this medicine with anyone. Selling or giving away this medicine is dangerous and against the law. Store at room temperature between 15 and 30 degrees C (59 and 86 degrees F). Keep container tightly closed. Protect from light. This medicine may cause accidental overdose and death if it is taken by other adults, children, or pets. Flush any unused medicine down the toilet to reduce the chance of harm. Do not use the medicine after the expiration date. NOTE: This sheet is a summary. It may not cover all possible information. If you have questions about this medicine, talk to your doctor, pharmacist, or health care provider.  2015, [employer]. (2013-02-08 13:16:32)

## 2014-05-09 ENCOUNTER — Encounter (HOSPITAL_COMMUNITY): Payer: Self-pay | Admitting: Emergency Medicine

## 2014-05-09 ENCOUNTER — Emergency Department (HOSPITAL_COMMUNITY)
Admission: EM | Admit: 2014-05-09 | Discharge: 2014-05-09 | Disposition: A | Discharge status: Home / Self Care | Payer: Medicare Other | Attending: Emergency Medicine | Admitting: Emergency Medicine

## 2014-05-09 DIAGNOSIS — N390 Urinary tract infection, site not specified: Secondary | ICD-10-CM | POA: Diagnosis not present

## 2014-05-09 DIAGNOSIS — Z79899 Other long term (current) drug therapy: Secondary | ICD-10-CM | POA: Insufficient documentation

## 2014-05-09 DIAGNOSIS — Z792 Long term (current) use of antibiotics: Secondary | ICD-10-CM | POA: Insufficient documentation

## 2014-05-09 DIAGNOSIS — F319 Bipolar disorder, unspecified: Secondary | ICD-10-CM | POA: Insufficient documentation

## 2014-05-09 DIAGNOSIS — Z87442 Personal history of urinary calculi: Secondary | ICD-10-CM | POA: Insufficient documentation

## 2014-05-09 DIAGNOSIS — E669 Obesity, unspecified: Secondary | ICD-10-CM | POA: Insufficient documentation

## 2014-05-09 DIAGNOSIS — Z720 Tobacco use: Secondary | ICD-10-CM | POA: Diagnosis not present

## 2014-05-09 DIAGNOSIS — Z3202 Encounter for pregnancy test, result negative: Secondary | ICD-10-CM | POA: Diagnosis not present

## 2014-05-09 DIAGNOSIS — K219 Gastro-esophageal reflux disease without esophagitis: Secondary | ICD-10-CM | POA: Insufficient documentation

## 2014-05-09 DIAGNOSIS — G8929 Other chronic pain: Secondary | ICD-10-CM | POA: Diagnosis not present

## 2014-05-09 DIAGNOSIS — R102 Pelvic and perineal pain: Secondary | ICD-10-CM | POA: Diagnosis present

## 2014-05-09 LAB — URINALYSIS, ROUTINE W REFLEX MICROSCOPIC
Bilirubin Urine: NEGATIVE
Glucose, UA: 500 mg/dL — AB
Hgb urine dipstick: NEGATIVE
Nitrite: POSITIVE — AB
Protein, ur: 100 mg/dL — AB
Specific Gravity, Urine: 1.025 (ref 1.005–1.030)
Urobilinogen, UA: >8 mg/dL — ABNORMAL HIGH (ref 0.0–1.0)
pH: 5 (ref 5.0–8.0)

## 2014-05-09 LAB — URINE MICROSCOPIC-ADD ON

## 2014-05-09 LAB — PREGNANCY, URINE: Preg Test, Ur: NEGATIVE

## 2014-05-09 MED ORDER — ACETAMINOPHEN 325 MG PO TABS
ORAL_TABLET | ORAL | Status: AC
  Filled 2014-05-09: qty 2

## 2014-05-09 MED ORDER — CEPHALEXIN 500 MG PO CAPS: 500.0000 mg | ORAL_CAPSULE | Freq: Four times a day (QID) | ORAL | Status: DC

## 2014-05-09 MED ORDER — ONDANSETRON 8 MG PO TBDP
8.0000 mg | ORAL_TABLET | Freq: Once | ORAL | Status: AC
  Administered 2014-05-09: 8 mg via ORAL
  Filled 2014-05-09: qty 1

## 2014-05-09 MED ORDER — ACETAMINOPHEN 325 MG PO TABS
650.0000 mg | ORAL_TABLET | Freq: Once | ORAL | Status: AC
  Administered 2014-05-09: 650 mg via ORAL

## 2014-05-09 MED ORDER — IBUPROFEN 800 MG PO TABS
800.0000 mg | ORAL_TABLET | Freq: Once | ORAL | Status: AC
  Administered 2014-05-09: 800 mg via ORAL
  Filled 2014-05-09: qty 1

## 2014-05-09 MED ORDER — CEPHALEXIN 500 MG PO CAPS
500.0000 mg | ORAL_CAPSULE | Freq: Once | ORAL | Status: AC
  Administered 2014-05-09: 500 mg via ORAL
  Filled 2014-05-09: qty 1

## 2014-05-09 NOTE — ED Provider Notes (Signed)
CSN: [id number]     Arrival date & time 05/09/14  0013 History   First MD Initiated Contact with Patient 05/09/14 0130     Chief Complaint  Patient presents with  . Pelvic Pain      Patient is a 23 y.o. female presenting with pelvic pain. The history is provided by the patient.  Pelvic Pain This is a chronic problem. The current episode started more than 1 week ago. The problem occurs every several days. The problem has been gradually worsening. Associated symptoms include abdominal pain. Pertinent negatives include no chest pain. Exacerbated by: palpation. Nothing relieves the symptoms.  Pt presents for low abdominal/pelvic pain She reports she has this pain frequently, usually has it weekly She reports this episode started again 2 days ago, similar to prior She reports associated nausea/vomiting/diarrhea (nonbloody) She denies vag bleeding/discharge She reports she has f/u with PCP later this week but she "Couldn't wait"  Past Medical History  Diagnosis Date  . GERD (gastroesophageal reflux disease)   . Bipolar 1 disorder   . Depression   . Headache(784.0)   . Urinary tract infection   . Kidney stones   . Carpal tunnel syndrome on both sides     with preg  . Bipolar disorder   . Abnormal Pap smear   . Vaginal discharge 03/11/2013  . RLQ abdominal pain 03/11/2013  . BV (bacterial vaginosis) 03/11/2013  . Ovarian cyst, right   . Obesity 06/17/2013  . Unspecified symptom associated with female genital organs 01/06/2014   Past Surgical History  Procedure Laterality Date  . Tonsillectomy and adenoidectomy Bilateral   . Tonsillectomy    . Myringotomy     Family History  Problem Relation Age of Onset  . Hypertension Mother   . Hypertension Father   . Hypertension Maternal Aunt   . Hypertension Maternal Uncle   . Hypertension Paternal Aunt   . Hypertension Paternal Uncle   . Diabetes Maternal Grandfather   . Diabetes Paternal Grandmother   . Other Son     swallowing  problems   History  Substance Use Topics  . Smoking status: Light Tobacco Smoker -- 0.25 packs/day for 1 years    Types: Cigarettes    Last Attempt to Quit: 04/19/2012  . Smokeless tobacco: Never Used  . Alcohol Use: No   OB History    Gravida Para Term Preterm AB TAB SAB Ectopic Multiple Living   1 1 1  0 0 0 0 0 0 1     Review of Systems  Constitutional: Positive for chills. Negative for fever.  Cardiovascular: Negative for chest pain.  Gastrointestinal: Positive for vomiting, abdominal pain and diarrhea. Negative for blood in stool.  Genitourinary: Positive for pelvic pain. Negative for vaginal bleeding and vaginal discharge.  Musculoskeletal: Positive for back pain.  All other systems reviewed and are negative.     Allergies  Hydrocodone  Home Medications   Prior to Admission medications   Medication Sig Start Date End Date Taking? Authorizing Provider  cephALEXin (KEFLEX) 500 MG capsule Take 1 capsule (500 mg total) by mouth 4 (four) times daily. 05/09/14   Joya Gaskinsonald W Temple Sporer, MD  gabapentin (NEURONTIN) 100 MG capsule Take 100 mg by mouth daily.    Historical Provider, MD  ibuprofen (ADVIL,MOTRIN) 200 MG tablet Take 200 mg by mouth every 6 (six) hours as needed for moderate pain.    Historical Provider, MD  omeprazole (PRILOSEC) 20 MG capsule Take 20 mg by mouth daily.  Historical Provider, MD  ondansetron (ZOFRAN ODT) 4 MG disintegrating tablet Take 1 tablet (4 mg total) by mouth every 8 (eight) hours as needed for nausea or vomiting. 4mg  ODT q4 hours prn nausea/vomit 04/12/14   John L Molpus, MD  ondansetron (ZOFRAN ODT) 4 MG disintegrating tablet Take 1 tablet (4 mg total) by mouth every 8 (eight) hours as needed for nausea or vomiting. 4mg  ODT q4 hours prn nausea/vomit 04/12/14   John L Molpus, MD  Oxcarbazepine (TRILEPTAL) 300 MG tablet Take 300 mg by mouth 2 (two) times daily.    Historical Provider, MD  PARoxetine (PAXIL) 20 MG tablet Take 20 mg by mouth daily.  09/02/13   Historical Provider, MD  traZODone (DESYREL) 100 MG tablet Take 100 mg by mouth at bedtime.    Historical Provider, MD   BP 128/76 mmHg  Pulse 83  Temp(Src) 98.1 F (36.7 C) (Oral)  Resp 16  SpO2 96%  LMP 04/26/2014 (Approximate) Physical Exam CONSTITUTIONAL: Well developed/well nourished HEAD: Normocephalic/atraumatic EYES: EOMI/PERRL ENMT: Mucous membranes moist NECK: supple no meningeal signs SPINE:entire spine nontender Mild tenderness to right thoracic paraspinal region.  No bruising/erythema noted CV: S1/S2 noted, no murmurs/rubs/gallops noted LUNGS: Lungs are clear to auscultation bilaterally, no apparent distress ABDOMEN: soft, nontender, no rebound or guarding GU:no cva tenderness NEURO: Pt is awake/alert, moves all extremitiesx4 EXTREMITIES: pulses normal, full ROM SKIN: warm, color normal PSYCH: no abnormalities of mood noted  ED Course  Procedures  Labs Review Labs Reviewed  URINALYSIS, ROUTINE W REFLEX MICROSCOPIC - Abnormal; Notable for the following:    Color, Urine ORANGE (*)    Glucose, UA 500 (*)    Ketones, ur TRACE (*)    Protein, ur 100 (*)    Urobilinogen, UA >8.0 (*)    Nitrite POSITIVE (*)    Leukocytes, UA TRACE (*)    All other components within normal limits  URINE MICROSCOPIC-ADD ON - Abnormal; Notable for the following:    Squamous Epithelial / LPF FEW (*)    Bacteria, UA FEW (*)    All other components within normal limits  PREGNANCY, URINE   2:02 AM PATIENT NOTED TO HAVE UTI OTHERWISE HER EXAM IS UNREMARKABLE PT ADMITS THIS PAIN IS A CHRONIC PROCESS.  DEFERRED FURTHER IMAGING/LABS.  DEFERRED PELVIC EXAM WHEN IBUPROFEN/APAP OFFERED, SHE REQUESTED ONLY IV MEDICATIONS PT WANTS TO LEAVE AND COME BACK LATER ADVISED THAT HER CHRONIC PAIN IS BEST TREATED BY HER PCP OR PAIN MANAGEMENT AT DISCHARGE, PT BEGAN YELLING AND CURSING STAFF. SHE WAS ESCORTED OUT OF THE ER BY SECURITY    MDM   Final diagnoses:  Chronic pain  UTI  (lower urinary tract infection)    Nursing notes including past medical history and social history reviewed and considered in documentation Labs/vital reviewed and considered Previous records reviewed and considered Narcotic database reviewed and considered in decision making     Joya Gaskinsonald W Adi Seales, MD 05/09/14 0206

## 2014-05-09 NOTE — ED Notes (Signed)
Per EMS and patient, c/o right pelvic pain with nausea since yesterday.

## 2014-05-09 NOTE — ED Notes (Signed)
Pt states she is unable to take ibuprofen due to her ulcers; Dr. Bebe ShaggyWickline informed and order given for pt to have tylenol.  When said nurse went in to give tylenol pt stated that she had been taking tylenol all day with no relief and that she has been unable to keep anything down; said nurse stated that is the reason for giving Zofran;  Pt states she will be back because she is not going to be any better; Dr. Bebe ShaggyWickline in to see pt

## 2014-05-09 NOTE — ED Notes (Signed)
Pt given discharge papers; pt cussing and stating she is never coming back here; as pt was walking up the hallway pt had a cup a water and spilled her water and slipped in the puddle. Pt did not fall and continued up the hall and out to the waiting room

## 2014-05-20 ENCOUNTER — Emergency Department (HOSPITAL_COMMUNITY)
Admission: EM | Admit: 2014-05-20 | Discharge: 2014-05-20 | Discharge status: Against Medical Advice | Payer: Medicare Other | Attending: Emergency Medicine | Admitting: Emergency Medicine

## 2014-05-20 ENCOUNTER — Encounter (HOSPITAL_COMMUNITY): Payer: Self-pay | Admitting: *Deleted

## 2014-05-20 DIAGNOSIS — F319 Bipolar disorder, unspecified: Secondary | ICD-10-CM | POA: Insufficient documentation

## 2014-05-20 DIAGNOSIS — Z87440 Personal history of urinary (tract) infections: Secondary | ICD-10-CM | POA: Diagnosis not present

## 2014-05-20 DIAGNOSIS — Z8742 Personal history of other diseases of the female genital tract: Secondary | ICD-10-CM | POA: Diagnosis not present

## 2014-05-20 DIAGNOSIS — Z87442 Personal history of urinary calculi: Secondary | ICD-10-CM | POA: Diagnosis not present

## 2014-05-20 DIAGNOSIS — Z3202 Encounter for pregnancy test, result negative: Secondary | ICD-10-CM | POA: Diagnosis not present

## 2014-05-20 DIAGNOSIS — Z765 Malingerer [conscious simulation]: Secondary | ICD-10-CM | POA: Insufficient documentation

## 2014-05-20 DIAGNOSIS — Z720 Tobacco use: Secondary | ICD-10-CM | POA: Insufficient documentation

## 2014-05-20 DIAGNOSIS — Z79899 Other long term (current) drug therapy: Secondary | ICD-10-CM | POA: Insufficient documentation

## 2014-05-20 DIAGNOSIS — E669 Obesity, unspecified: Secondary | ICD-10-CM | POA: Insufficient documentation

## 2014-05-20 DIAGNOSIS — Z792 Long term (current) use of antibiotics: Secondary | ICD-10-CM | POA: Insufficient documentation

## 2014-05-20 DIAGNOSIS — R1031 Right lower quadrant pain: Secondary | ICD-10-CM | POA: Insufficient documentation

## 2014-05-20 DIAGNOSIS — K219 Gastro-esophageal reflux disease without esophagitis: Secondary | ICD-10-CM | POA: Diagnosis not present

## 2014-05-20 LAB — COMPREHENSIVE METABOLIC PANEL
ALT: 15 U/L (ref 0–35)
AST: 17 U/L (ref 0–37)
Albumin: 4.2 g/dL (ref 3.5–5.2)
Alkaline Phosphatase: 66 U/L (ref 39–117)
Anion gap: 10 (ref 5–15)
BUN: 6 mg/dL (ref 6–23)
CO2: 27 mEq/L (ref 19–32)
Calcium: 9.4 mg/dL (ref 8.4–10.5)
Chloride: 102 mEq/L (ref 96–112)
Creatinine, Ser: 0.82 mg/dL (ref 0.50–1.10)
GFR calc Af Amer: >90 mL/min (ref >90)
GFR calc non Af Amer: >90 mL/min (ref >90)
Glucose, Bld: 92 mg/dL (ref 70–99)
Potassium: 4.1 mEq/L (ref 3.7–5.3)
Sodium: 139 mEq/L (ref 137–147)
Total Bilirubin: 0.3 mg/dL (ref 0.3–1.2)
Total Protein: 7 g/dL (ref 6.0–8.3)

## 2014-05-20 LAB — URINALYSIS, ROUTINE W REFLEX MICROSCOPIC
Bilirubin Urine: NEGATIVE
Glucose, UA: NEGATIVE mg/dL
Hgb urine dipstick: NEGATIVE
Ketones, ur: NEGATIVE mg/dL
Nitrite: NEGATIVE
Protein, ur: NEGATIVE mg/dL
Specific Gravity, Urine: 1.015 (ref 1.005–1.030)
Urobilinogen, UA: 0.2 mg/dL (ref 0.0–1.0)
pH: 6.5 (ref 5.0–8.0)

## 2014-05-20 LAB — CBC WITH DIFFERENTIAL/PLATELET
Basophils Absolute: 0.1 10*3/uL (ref 0.0–0.1)
Basophils Relative: 1 % (ref 0–1)
Eosinophils Absolute: 0.4 10*3/uL (ref 0.0–0.7)
Eosinophils Relative: 5 % (ref 0–5)
HCT: 39.5 % (ref 36.0–46.0)
Hemoglobin: 13.3 g/dL (ref 12.0–15.0)
Lymphocytes Relative: 39 % (ref 12–46)
Lymphs Abs: 2.6 10*3/uL (ref 0.7–4.0)
MCH: 31.2 pg (ref 26.0–34.0)
MCHC: 33.7 g/dL (ref 30.0–36.0)
MCV: 92.7 fL (ref 78.0–100.0)
Monocytes Absolute: 0.5 10*3/uL (ref 0.1–1.0)
Monocytes Relative: 7 % (ref 3–12)
Neutro Abs: 3.2 10*3/uL (ref 1.7–7.7)
Neutrophils Relative %: 48 % (ref 43–77)
Platelets: 292 10*3/uL (ref 150–400)
RBC: 4.26 MIL/uL (ref 3.87–5.11)
RDW: 13.9 % (ref 11.5–15.5)
WBC: 6.7 10*3/uL (ref 4.0–10.5)

## 2014-05-20 LAB — URINE MICROSCOPIC-ADD ON

## 2014-05-20 LAB — PREGNANCY, URINE: Preg Test, Ur: NEGATIVE

## 2014-05-20 MED ORDER — GI COCKTAIL ~~LOC~~
30.0000 mL | Freq: Once | ORAL | Status: AC
  Administered 2014-05-20: 30 mL via ORAL
  Filled 2014-05-20: qty 30

## 2014-05-20 MED ORDER — ONDANSETRON HCL 4 MG/2ML IJ SOLN
4.0000 mg | Freq: Once | INTRAMUSCULAR | Status: AC
  Administered 2014-05-20: 4 mg via INTRAVENOUS
  Filled 2014-05-20: qty 2

## 2014-05-20 MED ORDER — KETOROLAC TROMETHAMINE 30 MG/ML IJ SOLN
30.0000 mg | Freq: Once | INTRAMUSCULAR | Status: AC
  Administered 2014-05-20: 30 mg via INTRAVENOUS
  Filled 2014-05-20: qty 1

## 2014-05-20 MED ORDER — SODIUM CHLORIDE 0.9 % IV BOLUS (SEPSIS)
1000.0000 mL | Freq: Once | INTRAVENOUS | Status: AC
  Administered 2014-05-20: 1000 mL via INTRAVENOUS

## 2014-05-20 NOTE — ED Notes (Signed)
Pt reports she is leaving because "no one is going to help me". Pt took out own IV. Requested that patient wait for all test results to come back in case of need for further treatment. Pt refused. Stated "I will figure it out on my own". EDP made aware.

## 2014-05-20 NOTE — ED Notes (Signed)
Rt abd pain, hx of ovarian cyst.  N/V/D  Stools are "dark"

## 2014-05-20 NOTE — ED Provider Notes (Signed)
CSN: [id number]     Arrival date & time 05/20/14  1739 History   First MD Initiated Contact with Patient 05/20/14 1840     Chief Complaint  Patient presents with  . Abdominal Pain     (Consider location/radiation/quality/duration/timing/severity/associated sxs/prior Treatment) Patient is a 23 y.o. female presenting with abdominal pain. The history is provided by the patient.  Abdominal Pain Pain location:  RLQ Pain quality: stabbing   Pain radiates to:  Does not radiate Pain severity:  Moderate Onset quality:  Gradual Timing:  Constant Progression:  Worsening Chronicity:  New Relieved by:  Nothing Worsened by:  Movement and palpation Ineffective treatments:  None tried Associated symptoms: no chills, no fever, no hematuria, no vaginal bleeding and no vaginal discharge     Past Medical History  Diagnosis Date  . GERD (gastroesophageal reflux disease)   . Bipolar 1 disorder   . Depression   . Headache(784.0)   . Urinary tract infection   . Carpal tunnel syndrome on both sides     with preg  . Bipolar disorder   . Abnormal Pap smear   . Vaginal discharge 03/11/2013  . RLQ abdominal pain 03/11/2013  . BV (bacterial vaginosis) 03/11/2013  . Ovarian cyst, right   . Obesity 06/17/2013  . Unspecified symptom associated with female genital organs 01/06/2014  . Kidney stones    Past Surgical History  Procedure Laterality Date  . Tonsillectomy and adenoidectomy Bilateral   . Tonsillectomy    . Myringotomy     Family History  Problem Relation Age of Onset  . Hypertension Mother   . Hypertension Father   . Hypertension Maternal Aunt   . Hypertension Maternal Uncle   . Hypertension Paternal Aunt   . Hypertension Paternal Uncle   . Diabetes Maternal Grandfather   . Diabetes Paternal Grandmother   . Other Son     swallowing problems   History  Substance Use Topics  . Smoking status: Light Tobacco Smoker -- 0.25 packs/day for 1 years    Types: Cigarettes    Last Attempt  to Quit: 04/19/2012  . Smokeless tobacco: Never Used  . Alcohol Use: No   OB History    Gravida Para Term Preterm AB TAB SAB Ectopic Multiple Living   1 1 1  0 0 0 0 0 0 1     Review of Systems  Constitutional: Negative for fever and chills.  Gastrointestinal: Positive for abdominal pain.  Genitourinary: Negative for hematuria, vaginal bleeding and vaginal discharge.  All other systems reviewed and are negative.     Allergies  Hydrocodone  Home Medications   Prior to Admission medications   Medication Sig Start Date End Date Taking? Authorizing Provider  cephALEXin (KEFLEX) 500 MG capsule Take 1 capsule (500 mg total) by mouth 4 (four) times daily. 05/09/14   Joya Gaskinsonald W Wickline, MD  gabapentin (NEURONTIN) 100 MG capsule Take 100 mg by mouth daily.    Historical Provider, MD  ibuprofen (ADVIL,MOTRIN) 200 MG tablet Take 200 mg by mouth every 6 (six) hours as needed for moderate pain.    Historical Provider, MD  omeprazole (PRILOSEC) 20 MG capsule Take 20 mg by mouth daily.    Historical Provider, MD  ondansetron (ZOFRAN ODT) 4 MG disintegrating tablet Take 1 tablet (4 mg total) by mouth every 8 (eight) hours as needed for nausea or vomiting. 4mg  ODT q4 hours prn nausea/vomit 04/12/14   John L Molpus, MD  ondansetron (ZOFRAN ODT) 4 MG disintegrating tablet Take 1  tablet (4 mg total) by mouth every 8 (eight) hours as needed for nausea or vomiting. 4mg  ODT q4 hours prn nausea/vomit 04/12/14   John L Molpus, MD  Oxcarbazepine (TRILEPTAL) 300 MG tablet Take 300 mg by mouth 2 (two) times daily.    Historical Provider, MD  PARoxetine (PAXIL) 20 MG tablet Take 20 mg by mouth daily. 09/02/13   Historical Provider, MD  traZODone (DESYREL) 100 MG tablet Take 100 mg by mouth at bedtime.    Historical Provider, MD   BP 131/74 mmHg  Pulse 65  Temp(Src) 98.3 F (36.8 C) (Oral)  Resp 20  Ht 5' (1.524 m)  Wt 178 lb (80.74 kg)  BMI 34.76 kg/m2  SpO2 100%  LMP 04/26/2014 (Approximate)   Breastfeeding? No Physical Exam  Constitutional: She is oriented to person, place, and time. She appears well-developed and well-nourished. No distress.  HENT:  Head: Normocephalic and atraumatic.  Neck: Normal range of motion. Neck supple.  Cardiovascular: Normal rate and regular rhythm.  Exam reveals no gallop and no friction rub.   No murmur heard. Pulmonary/Chest: Effort normal and breath sounds normal. No respiratory distress. She has no wheezes.  Abdominal: Soft. Bowel sounds are normal. She exhibits no distension and no mass. There is tenderness. There is no rebound and no guarding.  TTP in the right lower quadrant.  No rebound or guarding.    Musculoskeletal: Normal range of motion.  Neurological: She is alert and oriented to person, place, and time.  Skin: Skin is warm and dry. She is not diaphoretic.  Nursing note and vitals reviewed.   ED Course  Procedures (including critical care time) Labs Review Labs Reviewed  CBC WITH DIFFERENTIAL  COMPREHENSIVE METABOLIC PANEL    Imaging Review No results found.   EKG Interpretation None      MDM   Final diagnoses:  None    Patient is a 23 year old female who presents with complaints of right lower quadrant pain which is chronic in nature and has been present for many months. She has had multiple workups including CT scans, laboratory studies which have all been unremarkable. She presents today with complaints of ongoing pain and requesting pain medication.  Her physical examination reveals tenderness in the right lower quadrant with no rebound and no guarding. Laboratory studies were obtained which reveal no elevation of white count and unremarkable electrolytes despite her telling me she has been unable to eat or drink for several days. Her urinalysis is clear and there are no ketones in her urine nor is the specific gravity abnormal. Her physical complaints do not match these laboratory study findings and I highly suspect  she is drug-seeking.  Upon reviewing her record from her prior visit, she left angry cursing at the ED staff when she was not given the pain medication she was requesting. This occurred again today when I declined to administer narcotic medications. She removed her own IV, then signed out AGAINST MEDICAL ADVICE making derogatory remarks about me.    Geoffery Lyonsouglas Kataleah Bejar, MD 05/20/14 2116

## 2014-05-20 NOTE — ED Notes (Signed)
EDP at pt's bedside. Informed pt that she would not be receiving any more pain medication.

## 2014-05-29 ENCOUNTER — Emergency Department (HOSPITAL_COMMUNITY)
Admission: EM | Admit: 2014-05-29 | Discharge: 2014-05-29 | Disposition: A | Discharge status: Home / Self Care | Payer: Medicare Other | Attending: Emergency Medicine | Admitting: Emergency Medicine

## 2014-05-29 ENCOUNTER — Emergency Department (HOSPITAL_COMMUNITY): Payer: Medicare Other

## 2014-05-29 ENCOUNTER — Encounter (HOSPITAL_COMMUNITY): Payer: Self-pay | Admitting: *Deleted

## 2014-05-29 DIAGNOSIS — F319 Bipolar disorder, unspecified: Secondary | ICD-10-CM | POA: Insufficient documentation

## 2014-05-29 DIAGNOSIS — Z87442 Personal history of urinary calculi: Secondary | ICD-10-CM | POA: Insufficient documentation

## 2014-05-29 DIAGNOSIS — R52 Pain, unspecified: Secondary | ICD-10-CM

## 2014-05-29 DIAGNOSIS — E669 Obesity, unspecified: Secondary | ICD-10-CM | POA: Insufficient documentation

## 2014-05-29 DIAGNOSIS — Z720 Tobacco use: Secondary | ICD-10-CM | POA: Insufficient documentation

## 2014-05-29 DIAGNOSIS — J209 Acute bronchitis, unspecified: Secondary | ICD-10-CM | POA: Diagnosis not present

## 2014-05-29 DIAGNOSIS — Z3202 Encounter for pregnancy test, result negative: Secondary | ICD-10-CM | POA: Insufficient documentation

## 2014-05-29 DIAGNOSIS — Z79899 Other long term (current) drug therapy: Secondary | ICD-10-CM | POA: Insufficient documentation

## 2014-05-29 DIAGNOSIS — Z87440 Personal history of urinary (tract) infections: Secondary | ICD-10-CM | POA: Diagnosis not present

## 2014-05-29 DIAGNOSIS — K219 Gastro-esophageal reflux disease without esophagitis: Secondary | ICD-10-CM | POA: Diagnosis not present

## 2014-05-29 DIAGNOSIS — Z792 Long term (current) use of antibiotics: Secondary | ICD-10-CM | POA: Insufficient documentation

## 2014-05-29 DIAGNOSIS — Z8742 Personal history of other diseases of the female genital tract: Secondary | ICD-10-CM | POA: Insufficient documentation

## 2014-05-29 DIAGNOSIS — R05 Cough: Secondary | ICD-10-CM | POA: Diagnosis present

## 2014-05-29 LAB — POC URINE PREG, ED: Preg Test, Ur: NEGATIVE

## 2014-05-29 LAB — URINALYSIS, ROUTINE W REFLEX MICROSCOPIC
Glucose, UA: NEGATIVE mg/dL
Hgb urine dipstick: NEGATIVE
Nitrite: NEGATIVE
Specific Gravity, Urine: >1.03 — ABNORMAL HIGH (ref 1.005–1.030)
Urobilinogen, UA: 0.2 mg/dL (ref 0.0–1.0)
pH: 6 (ref 5.0–8.0)

## 2014-05-29 LAB — WET PREP, GENITAL
Clue Cells Wet Prep HPF POC: NONE SEEN
Trich, Wet Prep: NONE SEEN
Yeast Wet Prep HPF POC: NONE SEEN

## 2014-05-29 LAB — URINE MICROSCOPIC-ADD ON

## 2014-05-29 MED ORDER — IPRATROPIUM BROMIDE 0.02 % IN SOLN
0.5000 mg | Freq: Once | RESPIRATORY_TRACT | Status: AC
  Administered 2014-05-29: 0.5 mg via RESPIRATORY_TRACT
  Filled 2014-05-29: qty 2.5

## 2014-05-29 MED ORDER — BENZONATATE 100 MG PO CAPS: 100.0000 mg | ORAL_CAPSULE | Freq: Three times a day (TID) | ORAL | Status: DC | PRN

## 2014-05-29 MED ORDER — DIPHENHYDRAMINE HCL 25 MG PO TABS: 25.0000 mg | ORAL_TABLET | Freq: Four times a day (QID) | ORAL | Status: DC | PRN

## 2014-05-29 MED ORDER — ALBUTEROL SULFATE HFA 108 (90 BASE) MCG/ACT IN AERS
2.0000 | INHALATION_SPRAY | RESPIRATORY_TRACT | Status: DC | PRN
  Administered 2014-05-29: 2 via RESPIRATORY_TRACT
  Filled 2014-05-29: qty 6.7

## 2014-05-29 MED ORDER — IPRATROPIUM-ALBUTEROL 0.5-2.5 (3) MG/3ML IN SOLN: 3.0000 mL | Freq: Once | RESPIRATORY_TRACT | Status: DC

## 2014-05-29 MED ORDER — ALBUTEROL SULFATE (2.5 MG/3ML) 0.083% IN NEBU
5.0000 mg | INHALATION_SOLUTION | Freq: Once | RESPIRATORY_TRACT | Status: AC
  Administered 2014-05-29: 5 mg via RESPIRATORY_TRACT
  Filled 2014-05-29: qty 6

## 2014-05-29 NOTE — ED Notes (Signed)
Pt up to restroom.

## 2014-05-29 NOTE — Discharge Instructions (Signed)

## 2014-05-29 NOTE — ED Provider Notes (Signed)
CSN: [id number]     Arrival date & time 05/29/14  0145 History   First MD Initiated Contact with Patient 05/29/14 0249     Chief Complaint  Patient presents with  . Cough     (Consider location/radiation/quality/duration/timing/severity/associated sxs/prior Treatment) HPI  This is a 23 year old female who complains of several weeks of coughing. She was seen by her primary care physician and given a prescription for an unspecified medication that she could not afford. The cough is persistent. She has also had vomiting which is a chronic problem for her. She has a gastroenterologist with whom she has scheduled follow-up. She has been diagnosed with irritable bowel syndrome. This is accompanied by diarrhea at times as well as constipation at times. She also has chronic pain of the pubic bone, worse with palpation or wearing tight close. She has also been seen recently for right lower quadrant abdominal pain; a CT scan did not elucidate the cause of this pain although she does have a known left adnexal cyst. She has an albuterol inhaler but states she has run out. She describes the cough as due to the feeling of a scratch in her throat.  She further complains of a watery vaginal discharge and is concerned that she may be pregnant.  Past Medical History  Diagnosis Date  . GERD (gastroesophageal reflux disease)   . Bipolar 1 disorder   . Depression   . Headache(784.0)   . Urinary tract infection   . Carpal tunnel syndrome on both sides     with preg  . Bipolar disorder   . Abnormal Pap smear   . Vaginal discharge 03/11/2013  . RLQ abdominal pain 03/11/2013  . BV (bacterial vaginosis) 03/11/2013  . Ovarian cyst, right   . Obesity 06/17/2013  . Unspecified symptom associated with female genital organs 01/06/2014  . Kidney stones    Past Surgical History  Procedure Laterality Date  . Tonsillectomy and adenoidectomy Bilateral   . Tonsillectomy    . Myringotomy     Family History  Problem  Relation Age of Onset  . Hypertension Mother   . Hypertension Father   . Hypertension Maternal Aunt   . Hypertension Maternal Uncle   . Hypertension Paternal Aunt   . Hypertension Paternal Uncle   . Diabetes Maternal Grandfather   . Diabetes Paternal Grandmother   . Other Son     swallowing problems   History  Substance Use Topics  . Smoking status: Light Tobacco Smoker -- 0.25 packs/day for 1 years    Types: Cigarettes    Last Attempt to Quit: 04/19/2012  . Smokeless tobacco: Never Used  . Alcohol Use: No   OB History    Gravida Para Term Preterm AB TAB SAB Ectopic Multiple Living   1 1 1  0 0 0 0 0 0 1     Review of Systems  All other systems reviewed and are negative.   Allergies  Hydrocodone  Home Medications   Prior to Admission medications   Medication Sig Start Date End Date Taking? Authorizing Provider  cephALEXin (KEFLEX) 500 MG capsule Take 1 capsule (500 mg total) by mouth 4 (four) times daily. 05/09/14   Joya Gaskinsonald W Wickline, MD  gabapentin (NEURONTIN) 100 MG capsule Take 100 mg by mouth daily.    Historical Provider, MD  ibuprofen (ADVIL,MOTRIN) 200 MG tablet Take 200 mg by mouth every 6 (six) hours as needed for moderate pain.    Historical Provider, MD  omeprazole (PRILOSEC) 20 MG capsule  Take 20 mg by mouth daily.    Historical Provider, MD  ondansetron (ZOFRAN ODT) 4 MG disintegrating tablet Take 1 tablet (4 mg total) by mouth every 8 (eight) hours as needed for nausea or vomiting. 4mg  ODT q4 hours prn nausea/vomit Patient not taking: Reported on 05/20/2014 04/12/14   Carlisle Beers Priyansh Pry, MD  ondansetron (ZOFRAN ODT) 4 MG disintegrating tablet Take 1 tablet (4 mg total) by mouth every 8 (eight) hours as needed for nausea or vomiting. 4mg  ODT q4 hours prn nausea/vomit Patient not taking: Reported on 05/20/2014 04/12/14   Carlisle Beers Naftuli Dalsanto, MD  oxcarbazepine (TRILEPTAL) 600 MG tablet Take 600 mg by mouth 2 (two) times daily. 04/26/14   Historical Provider, MD  PARoxetine  (PAXIL) 20 MG tablet Take 20 mg by mouth daily. 09/02/13   Historical Provider, MD  traZODone (DESYREL) 100 MG tablet Take 100 mg by mouth at bedtime.    Historical Provider, MD   BP 130/78 mmHg  Pulse 83  Temp(Src) 98.2 F (36.8 C) (Oral)  Resp 20  Ht 5' (1.524 m)  Wt 186 lb (84.369 kg)  BMI 36.33 kg/m2  SpO2 96%  LMP 04/26/2014 (Approximate)   Physical Exam  General: Well-developed, well-nourished female in no acute distress; appearance consistent with age of record HENT: normocephalic; atraumatic Eyes: pupils equal, round and reactive to light; extraocular muscles intact Neck: supple Heart: regular rate and rhythm Lungs: clear to auscultation bilaterally Abdomen: soft; nondistended; tenderness of pubic bone; no masses or hepatosplenomegaly; bowel sounds present GU: Normal external genitalia; no vaginal bleeding; watery vaginal discharge; right adnexal tenderness Extremities: No deformity; full range of motion; pulses normal Neurologic: Awake, alert and oriented; motor function intact in all extremities and symmetric; no facial droop Skin: Warm and dry Psychiatric: Pressured speech; circumstantial    ED Course  Procedures (including critical care time)    MDM   Nursing notes and vitals signs, including pulse oximetry, reviewed.  Summary of this visit's results, reviewed by myself:  Labs:  Results for orders placed or performed during the hospital encounter of 05/29/14 (from the past 24 hour(s))  Wet prep, genital     Status: Abnormal   Collection Time: 05/29/14  3:14 AM  Result Value Ref Range   Yeast Wet Prep HPF POC NONE SEEN NONE SEEN   Trich, Wet Prep NONE SEEN NONE SEEN   Clue Cells Wet Prep HPF POC NONE SEEN NONE SEEN   WBC, Wet Prep HPF POC FEW (A) NONE SEEN  POC Urine Pregnancy, ED (do NOT order at [facility])     Status: None   Collection Time: 05/29/14  3:24 AM  Result Value Ref Range   Preg Test, Ur NEGATIVE NEGATIVE  Urinalysis, Routine w reflex microscopic      Status: Abnormal   Collection Time: 05/29/14  4:15 AM  Result Value Ref Range   Color, Urine YELLOW YELLOW   APPearance HAZY (A) CLEAR   Specific Gravity, Urine >1.030 (H) 1.005 - 1.030   pH 6.0 5.0 - 8.0   Glucose, UA NEGATIVE NEGATIVE mg/dL   Hgb urine dipstick NEGATIVE NEGATIVE   Bilirubin Urine SMALL (A) NEGATIVE   Ketones, ur TRACE (A) NEGATIVE mg/dL   Protein, ur TRACE (A) NEGATIVE mg/dL   Urobilinogen, UA 0.2 0.0 - 1.0 mg/dL   Nitrite NEGATIVE NEGATIVE   Leukocytes, UA SMALL (A) NEGATIVE  Urine microscopic-add on     Status: Abnormal   Collection Time: 05/29/14  4:15 AM  Result Value Ref Range  Squamous Epithelial / LPF MANY (A) RARE   WBC, UA 3-6 <3 WBC/hpf   RBC / HPF 0-2 <3 RBC/hpf   Bacteria, UA FEW (A) RARE    Imaging Studies: Dg Pelvis 1-2 Views  05/29/2014   CLINICAL DATA:  Vomiting.  Cough.  EXAM: PELVIS - 1-2 VIEW  COMPARISON:  CT of the abdomen and pelvis April 12, 2014  FINDINGS: There is no evidence of pelvic fracture or diastasis. No pelvic bone lesions are seen.  IMPRESSION: Negative.   Electronically Signed   By: Awilda Metroourtnay  Bloomer   On: 05/29/2014 04:08       Hanley SeamenJohn L Curby Carswell, MD 05/29/14 [phone]

## 2014-05-29 NOTE — ED Notes (Signed)
Pt c/o cough with vomiting and is c/o clear vaginal discharge

## 2014-05-31 LAB — GC/CHLAMYDIA PROBE AMP
CT Probe RNA: NEGATIVE
GC Probe RNA: NEGATIVE

## 2014-06-26 DIAGNOSIS — F332 Major depressive disorder, recurrent severe without psychotic features: Secondary | ICD-10-CM | POA: Insufficient documentation

## 2014-06-30 DIAGNOSIS — Z87898 Personal history of other specified conditions: Secondary | ICD-10-CM | POA: Insufficient documentation

## 2014-06-30 DIAGNOSIS — F1491 Cocaine use, unspecified, in remission: Secondary | ICD-10-CM | POA: Insufficient documentation

## 2014-08-07 ENCOUNTER — Encounter (HOSPITAL_COMMUNITY): Payer: Self-pay | Admitting: Emergency Medicine

## 2014-08-07 ENCOUNTER — Emergency Department (HOSPITAL_COMMUNITY)
Admission: EM | Admit: 2014-08-07 | Discharge: 2014-08-07 | Disposition: A | Discharge status: Home / Self Care | Payer: Medicare Other | Attending: Emergency Medicine | Admitting: Emergency Medicine

## 2014-08-07 ENCOUNTER — Emergency Department (HOSPITAL_COMMUNITY): Payer: Medicare Other

## 2014-08-07 DIAGNOSIS — R202 Paresthesia of skin: Secondary | ICD-10-CM | POA: Insufficient documentation

## 2014-08-07 DIAGNOSIS — Z87442 Personal history of urinary calculi: Secondary | ICD-10-CM | POA: Diagnosis not present

## 2014-08-07 DIAGNOSIS — F319 Bipolar disorder, unspecified: Secondary | ICD-10-CM | POA: Diagnosis not present

## 2014-08-07 DIAGNOSIS — E669 Obesity, unspecified: Secondary | ICD-10-CM | POA: Insufficient documentation

## 2014-08-07 DIAGNOSIS — K219 Gastro-esophageal reflux disease without esophagitis: Secondary | ICD-10-CM | POA: Diagnosis not present

## 2014-08-07 DIAGNOSIS — Z8742 Personal history of other diseases of the female genital tract: Secondary | ICD-10-CM | POA: Diagnosis not present

## 2014-08-07 DIAGNOSIS — Z3202 Encounter for pregnancy test, result negative: Secondary | ICD-10-CM | POA: Diagnosis not present

## 2014-08-07 DIAGNOSIS — Z79899 Other long term (current) drug therapy: Secondary | ICD-10-CM | POA: Insufficient documentation

## 2014-08-07 DIAGNOSIS — M25531 Pain in right wrist: Secondary | ICD-10-CM | POA: Diagnosis present

## 2014-08-07 DIAGNOSIS — Z87440 Personal history of urinary (tract) infections: Secondary | ICD-10-CM | POA: Diagnosis not present

## 2014-08-07 DIAGNOSIS — Z720 Tobacco use: Secondary | ICD-10-CM | POA: Insufficient documentation

## 2014-08-07 DIAGNOSIS — Z8669 Personal history of other diseases of the nervous system and sense organs: Secondary | ICD-10-CM | POA: Insufficient documentation

## 2014-08-07 LAB — POC URINE PREG, ED: Preg Test, Ur: NEGATIVE

## 2014-08-07 MED ORDER — OXYCODONE-ACETAMINOPHEN 5-325 MG PO TABS
ORAL_TABLET | ORAL | Status: AC
  Filled 2014-08-07: qty 1

## 2014-08-07 MED ORDER — OXYCODONE-ACETAMINOPHEN 5-325 MG PO TABS
1.0000 | ORAL_TABLET | Freq: Once | ORAL | Status: AC
  Administered 2014-08-07: 1 via ORAL

## 2014-08-07 NOTE — ED Notes (Signed)
Patient reports pain to right wrist and swelling x 3 days. States history of carpal tunnel.

## 2014-08-07 NOTE — ED Provider Notes (Signed)
CSN: [id number]     Arrival date & time 08/07/14  4098 History  This chart was scribed for non-physician practitioner, Kerrie Buffalo, NP, working with Flint Melter, MD, by Ronney Lion, ED Scribe. This patient was seen in room APFT22/APFT22 and the patient's care was started at 7:14 PM.    Chief Complaint  Patient presents with  . Wrist Pain   Patient is a 24 y.o. female presenting with wrist pain. The history is provided by the patient. No language interpreter was used.  Wrist Pain This is a recurrent problem. The current episode started more than 2 days ago. The problem occurs constantly. The problem has not changed since onset.Pertinent negatives include no chest pain, no abdominal pain, no headaches and no shortness of breath. Nothing aggravates the symptoms. Nothing relieves the symptoms. She has tried nothing for the symptoms.     HPI Comments: Connie [name] is a 24 y.o. female with a history of biilateral carpal tunnel syndrome who presents to the Emergency Department complaining of right wrist swelling and pain. Patient doesn't know if she has injured it, since she has been in and out of behavioral health the past few days. She complains of associated mild tingling.    Past Medical History  Diagnosis Date  . GERD (gastroesophageal reflux disease)   . Bipolar 1 disorder   . Depression   . Headache(784.0)   . Urinary tract infection   . Carpal tunnel syndrome on both sides     with preg  . Bipolar disorder   . Abnormal Pap smear   . Vaginal discharge 03/11/2013  . RLQ abdominal pain 03/11/2013  . BV (bacterial vaginosis) 03/11/2013  . Ovarian cyst, right   . Obesity 06/17/2013  . Unspecified symptom associated with female genital organs 01/06/2014  . Kidney stones    Past Surgical History  Procedure Laterality Date  . Tonsillectomy and adenoidectomy Bilateral   . Tonsillectomy    . Myringotomy     Family History  Problem Relation Age of Onset  . Hypertension Mother   .  Hypertension Father   . Hypertension Maternal Aunt   . Hypertension Maternal Uncle   . Hypertension Paternal Aunt   . Hypertension Paternal Uncle   . Diabetes Maternal Grandfather   . Diabetes Paternal Grandmother   . Other Son     swallowing problems   History  Substance Use Topics  . Smoking status: Light Tobacco Smoker -- 0.25 packs/day for 1 years    Types: Cigarettes    Last Attempt to Quit: 04/19/2012  . Smokeless tobacco: Never Used  . Alcohol Use: No   OB History    Gravida Para Term Preterm AB TAB SAB Ectopic Multiple Living   0 0 0 0 0 0 1     Review of Systems  Respiratory: Negative for shortness of breath.   Cardiovascular: Negative for chest pain.  Gastrointestinal: Negative for abdominal pain.  Musculoskeletal: Positive for arthralgias (right wrist).  Neurological: Negative for headaches.       Tingling      Allergies  Bactrim and Hydrocodone  Home Medications   Prior to Admission medications   Medication Sig Start Date End Date Taking? Authorizing Provider  albuterol (PROVENTIL HFA;VENTOLIN HFA) 108 (90 BASE) MCG/ACT inhaler Inhale 1 puff into the lungs every 4 (four) hours as needed. whezzing 08/02/14  Yes Historical Provider, MD  cyclobenzaprine (FLEXERIL) 10 MG tablet Take 10 mg by mouth 2 (two) times daily as  needed. For muscle spasms 07/22/14  Yes Historical Provider, MD  diclofenac sodium (VOLTAREN) 1 % GEL Apply 1 application topically 4 (four) times daily as needed. For pain 08/02/14  Yes Historical Provider, MD  divalproex (DEPAKOTE) 500 MG DR tablet Take 500-1,000 mg by mouth 2 (two) times daily. 1 in the morning and 2 at bedtime 08/02/14  Yes Historical Provider, MD  gabapentin (NEURONTIN) 300 MG capsule Take 300 mg by mouth 3 (three) times daily. 08/02/14  Yes Historical Provider, MD  ibuprofen (ADVIL,MOTRIN) 200 MG tablet Take 200 mg by mouth every 6 (six) hours as needed for moderate pain.   Yes Historical Provider, MD  pantoprazole (PROTONIX)  40 MG tablet Take 40 mg by mouth 2 (two) times daily. 08/02/14  Yes Historical Provider, MD  sertraline (ZOLOFT) 100 MG tablet Take 150 mg by mouth daily. Da 08/02/14  Yes Historical Provider, MD  traZODone (DESYREL) 100 MG tablet Take 150 mg by mouth at bedtime.    Yes Historical Provider, MD  ziprasidone (GEODON) 40 MG capsule Take 40 mg by mouth 2 (two) times daily. 08/02/14 10/31/14 Yes Historical Provider, MD  benzonatate (TESSALON) 100 MG capsule Take 1 capsule (100 mg total) by mouth 3 (three) times daily as needed for cough. Patient not taking: Reported on 08/07/2014 05/29/14   Carlisle Beers Molpus, MD  cephALEXin (KEFLEX) 500 MG capsule Take 1 capsule (500 mg total) by mouth 4 (four) times daily. Patient not taking: Reported on 08/07/2014 05/09/14   Joya Gaskins, MD  diphenhydrAMINE (BENADRYL) 25 MG tablet Take 1-2 tablets (25-50 mg total) by mouth every 6 (six) hours as needed (allergy symptoms). Patient not taking: Reported on 08/07/2014 05/29/14   Carlisle Beers Molpus, MD  ondansetron (ZOFRAN ODT) 4 MG disintegrating tablet Take 1 tablet (4 mg total) by mouth every 8 (eight) hours as needed for nausea or vomiting.  ODT q4 hours prn nausea/vomit Patient not taking: Reported on 05/20/2014 04/12/14   Carlisle Beers Molpus, MD  ondansetron (ZOFRAN ODT) 4 MG disintegrating tablet Take 1 tablet (4 mg total) by mouth every 8 (eight) hours as needed for nausea or vomiting.  ODT q4 hours prn nausea/vomit Patient not taking: Reported on 05/20/2014 04/12/14   Carlisle Beers Molpus, MD   BP 125/64 mmHg  Pulse 72  Temp(Src) 97.8 F (36.6 C) (Oral)  Resp 18  Ht 5' (1.524 m)  Wt 179 lb 6.4 oz (81.375 kg)  BMI 35.04 kg/m2  SpO2 99%  LMP 06/26/2014 Physical Exam  Constitutional: She is oriented to person, place, and time. She appears well-developed and well-nourished. No distress.  HENT:  Head: Normocephalic and atraumatic.  Eyes: Conjunctivae and EOM are normal.  Neck: Neck supple. No tracheal deviation present.   Cardiovascular: Normal rate.   Pulmonary/Chest: Effort normal. No respiratory distress.  Musculoskeletal: Normal range of motion. She exhibits tenderness. She exhibits no edema.       Right wrist: She exhibits tenderness. She exhibits normal range of motion, no deformity and no laceration. Swelling: minimal.  Full ROM of right wrist, pulses 2+ and equal, adequate circulation. Tenderness at the radial aspect of the right wrist, radiating to the thumb.  Neurological: She is alert and oriented to person, place, and time.  Skin: Skin is warm and dry.  Psychiatric: She has a normal mood and affect. Her behavior is normal.  Nursing note and vitals reviewed.   ED Course  Procedures (including critical care time)  DIAGNOSTIC STUDIES: Oxygen Saturation is 99% on room air,  normal by my interpretation.    COORDINATION OF CARE: 7:15 PM - Discussed treatment plan with pt at bedside which includes left wrist XR, and pt agreed to plan.   Labs Review Labs Reviewed  POC URINE PREG, ED    Imaging Review Dg Wrist Complete Right  08/07/2014   CLINICAL DATA:  Right wrist pain and swelling.  No trauma.  EXAM: RIGHT WRIST - COMPLETE 3+ VIEW  COMPARISON:  None.  FINDINGS: There is no evidence of fracture or dislocation. There is no evidence of arthropathy or other focal bone abnormality. Soft tissues are unremarkable.  IMPRESSION: Normal exam.   Electronically Signed   By: Geanie CooleyJim  Maxwell M.D.   On: 08/07/2014 19:54   MDM  24 y.o. female with right wrist pain that has been evaluated and treated with Voltaren and elastic wrist bandage. Continued pain today. I have reviewed this patient's vital signs, nurses notes, appropriate imaging and discussed findings and plan of care with the patient. Discussed follow up with hand and possible carpal tunnel pain. Patient voices understandings and agrees with plan. Wrist splint applied, ice, elevation.  Final diagnoses:  Right wrist pain   I personally performed the  services described in this documentation, which was scribed in my presence. The recorded information has been reviewed and is accurate.    Pearl RiverHope M Neese, NP 08/07/14 2010  Flint MelterElliott L Wentz, MD 08/07/14 2028

## 2014-08-07 NOTE — Discharge Instructions (Signed)
Apply ice as instructed in your discharge instructions. Take the Voltaren as directed. Wear the splint for comfort, elevate the area and follow up with orthopedic doctor if pain continues.

## 2014-08-07 NOTE — ED Notes (Signed)
Patient with no complaints at this time. Respirations even and unlabored. Skin warm/dry. Discharge instructions reviewed with patient at this time. Patient given opportunity to voice concerns/ask questions. Patient discharged at this time and left Emergency Department with steady gait.   

## 2014-11-08 NOTE — H&P (Signed)
L&D Evaluation:  History Expanded:   HPI 24 yo G1, presents at 6326 6/7 weeks with c/o abdominal pain and pelvic pressure. PNC in [place]. Denies LOF, VB or decreased FM. Also reporting "bump" in inner thigh. Collier Flowers.    Presents with abdominal pain    Patient's Medical History Bipolar    Patient's Surgical History tonsil, adnoids, tubes in ears    Medications Pre Natal Vitamins    Allergies NKDA   ROS:   ROS see HPI   Exam:   Vital Signs stable    General no apparent distress    Mental Status clear    Abdomen gravid, non-tender    Edema no edema    Pelvic no external lesions, cervix closed and thick    Mebranes Intact    FHT + FHR, appropriate for gestational age    Ucx absent    Other wet mount negative UA: Neg nitrites, neg blood, 2+ leuk, 22 WBC, 15 epithelial cells, crystals present Urine culture ordered   Impression:   Impression IUP at 25 weeks, no evidence of PTL   Plan:   Plan discharge    Comments F/u with primary OB re: inner thigh lesion Will f/u with pt if urine culture abnormal    Follow Up Appointment already scheduled. Early October   Electronic Signatures: Vella KohlerBrothers, Annisten Manchester K (CNM)  (Signed 11-Sep-13 22:19)  Authored: L&D Evaluation   Last Updated: 11-Sep-13 22:19 by Vella KohlerBrothers, Gemayel Mascio K (CNM)

## 2015-06-09 ENCOUNTER — Encounter (HOSPITAL_COMMUNITY): Payer: Self-pay | Admitting: Emergency Medicine

## 2015-06-09 ENCOUNTER — Emergency Department (HOSPITAL_COMMUNITY)
Admission: EM | Admit: 2015-06-09 | Discharge: 2015-06-10 | Disposition: A | Discharge status: Home / Self Care | Payer: 59 | Attending: Emergency Medicine | Admitting: Emergency Medicine

## 2015-06-09 DIAGNOSIS — Z8742 Personal history of other diseases of the female genital tract: Secondary | ICD-10-CM | POA: Diagnosis not present

## 2015-06-09 DIAGNOSIS — E669 Obesity, unspecified: Secondary | ICD-10-CM | POA: Insufficient documentation

## 2015-06-09 DIAGNOSIS — F319 Bipolar disorder, unspecified: Secondary | ICD-10-CM | POA: Insufficient documentation

## 2015-06-09 DIAGNOSIS — Z8619 Personal history of other infectious and parasitic diseases: Secondary | ICD-10-CM | POA: Insufficient documentation

## 2015-06-09 DIAGNOSIS — Z79899 Other long term (current) drug therapy: Secondary | ICD-10-CM | POA: Insufficient documentation

## 2015-06-09 DIAGNOSIS — Z3202 Encounter for pregnancy test, result negative: Secondary | ICD-10-CM | POA: Diagnosis not present

## 2015-06-09 DIAGNOSIS — K2970 Gastritis, unspecified, without bleeding: Secondary | ICD-10-CM | POA: Insufficient documentation

## 2015-06-09 DIAGNOSIS — Z87442 Personal history of urinary calculi: Secondary | ICD-10-CM | POA: Insufficient documentation

## 2015-06-09 DIAGNOSIS — K219 Gastro-esophageal reflux disease without esophagitis: Secondary | ICD-10-CM | POA: Diagnosis not present

## 2015-06-09 DIAGNOSIS — Z87440 Personal history of urinary (tract) infections: Secondary | ICD-10-CM | POA: Diagnosis not present

## 2015-06-09 DIAGNOSIS — R1013 Epigastric pain: Secondary | ICD-10-CM | POA: Diagnosis present

## 2015-06-09 DIAGNOSIS — F17210 Nicotine dependence, cigarettes, uncomplicated: Secondary | ICD-10-CM | POA: Diagnosis not present

## 2015-06-09 LAB — URINE MICROSCOPIC-ADD ON

## 2015-06-09 LAB — CBC WITH DIFFERENTIAL/PLATELET
Basophils Absolute: 0 10*3/uL (ref 0.0–0.1)
Basophils Relative: 0 %
Eosinophils Absolute: 0.3 10*3/uL (ref 0.0–0.7)
Eosinophils Relative: 4 %
HCT: 42.5 % (ref 36.0–46.0)
Hemoglobin: 14.9 g/dL (ref 12.0–15.0)
Lymphocytes Relative: 31 %
Lymphs Abs: 2.9 10*3/uL (ref 0.7–4.0)
MCH: 34 pg (ref 26.0–34.0)
MCHC: 35.1 g/dL (ref 30.0–36.0)
MCV: 97 fL (ref 78.0–100.0)
Monocytes Absolute: 0.8 10*3/uL (ref 0.1–1.0)
Monocytes Relative: 9 %
Neutro Abs: 5.1 10*3/uL (ref 1.7–7.7)
Neutrophils Relative %: 56 %
Platelets: 278 10*3/uL (ref 150–400)
RBC: 4.38 MIL/uL (ref 3.87–5.11)
RDW: 13.5 % (ref 11.5–15.5)
WBC: 9.1 10*3/uL (ref 4.0–10.5)

## 2015-06-09 LAB — COMPREHENSIVE METABOLIC PANEL
ALT: 14 U/L (ref 14–54)
AST: 16 U/L (ref 15–41)
Albumin: 4.8 g/dL (ref 3.5–5.0)
Alkaline Phosphatase: 53 U/L (ref 38–126)
Anion gap: 10 (ref 5–15)
BUN: 10 mg/dL (ref 6–20)
CO2: 26 mmol/L (ref 22–32)
Calcium: 9.4 mg/dL (ref 8.9–10.3)
Chloride: 102 mmol/L (ref 101–111)
Creatinine, Ser: 0.87 mg/dL (ref 0.44–1.00)
GFR calc Af Amer: >60 mL/min (ref >60)
GFR calc non Af Amer: >60 mL/min (ref >60)
Glucose, Bld: 83 mg/dL (ref 65–99)
Potassium: 3.9 mmol/L (ref 3.5–5.1)
Sodium: 138 mmol/L (ref 135–145)
Total Bilirubin: 0.4 mg/dL (ref 0.3–1.2)
Total Protein: 7.5 g/dL (ref 6.5–8.1)

## 2015-06-09 LAB — URINALYSIS, ROUTINE W REFLEX MICROSCOPIC
Bilirubin Urine: NEGATIVE
Glucose, UA: NEGATIVE mg/dL
Hgb urine dipstick: NEGATIVE
Ketones, ur: NEGATIVE mg/dL
Nitrite: NEGATIVE
Protein, ur: NEGATIVE mg/dL
Specific Gravity, Urine: 1.025 (ref 1.005–1.030)
pH: 7 (ref 5.0–8.0)

## 2015-06-09 LAB — LIPASE, BLOOD: Lipase: 26 U/L (ref 11–51)

## 2015-06-09 LAB — PREGNANCY, URINE: Preg Test, Ur: NEGATIVE

## 2015-06-09 MED ORDER — FAMOTIDINE IN NACL 20-0.9 MG/50ML-% IV SOLN
20.0000 mg | Freq: Once | INTRAVENOUS | Status: AC
  Administered 2015-06-09: 20 mg via INTRAVENOUS
  Filled 2015-06-09: qty 50

## 2015-06-09 MED ORDER — MORPHINE SULFATE (PF) 4 MG/ML IV SOLN
4.0000 mg | Freq: Once | INTRAVENOUS | Status: AC
  Administered 2015-06-09: 4 mg via INTRAVENOUS
  Filled 2015-06-09: qty 1

## 2015-06-09 MED ORDER — ONDANSETRON HCL 4 MG/2ML IJ SOLN
4.0000 mg | Freq: Once | INTRAMUSCULAR | Status: AC
  Administered 2015-06-09: 4 mg via INTRAVENOUS
  Filled 2015-06-09: qty 2

## 2015-06-09 NOTE — ED Notes (Signed)
Onset 2 days ago, sharp pain in abdomen, vomiting, denies diarrhea

## 2015-06-09 NOTE — ED Provider Notes (Signed)
CSN: [id number]     Arrival date & time 06/09/15  2131 History   First MD Initiated Contact with Patient 06/09/15 2208     Chief Complaint  Patient presents with  . Abdominal Pain     (Consider location/radiation/quality/duration/timing/severity/associated sxs/prior Treatment) The history is provided by the patient.   Connie [name] is a 24 y.o. female with a  2 day history of sharp intermittent epigastric pain along with vomiting, stating she has been unable to keep anything PO down. She states her last emesis was a dark brown liquid but denies eating or drinking any dark foods or liquid.  She denies fevers, chills, diarrhea, dysuria, cough, chest pain or shortness of breath.  She has found no alleviators or triggers for her symptoms. She does have a history of GERD and takes protonix daily.  She denies hematemesis or blood in stool.  She denies h/o stomach ulcers and denies frequent use of nsaids although has taken 3 advil this week due to headache which is better now.   Past Medical History  Diagnosis Date  . GERD (gastroesophageal reflux disease)   . Bipolar 1 disorder (HCC)   . Depression   . Headache(784.0)   . Urinary tract infection   . Carpal tunnel syndrome on both sides     with preg  . Bipolar disorder (HCC)   . Abnormal Pap smear   . Vaginal discharge 03/11/2013  . RLQ abdominal pain 03/11/2013  . BV (bacterial vaginosis) 03/11/2013  . Ovarian cyst, right   . Obesity 06/17/2013  . Unspecified symptom associated with female genital organs 01/06/2014  . Kidney stones    Past Surgical History  Procedure Laterality Date  . Tonsillectomy and adenoidectomy Bilateral   . Tonsillectomy    . Myringotomy     Family History  Problem Relation Age of Onset  . Hypertension Mother   . Hypertension Father   . Hypertension Maternal Aunt   . Hypertension Maternal Uncle   . Hypertension Paternal Aunt   . Hypertension Paternal Uncle   . Diabetes Maternal Grandfather   .  Diabetes Paternal Grandmother   . Other Son     swallowing problems   Social History  Substance Use Topics  . Smoking status: Light Tobacco Smoker -- 0.25 packs/day for 1 years    Types: Cigarettes    Last Attempt to Quit: 04/19/2012  . Smokeless tobacco: Never Used  . Alcohol Use: No   OB History    Gravida Para Term Preterm AB TAB SAB Ectopic Multiple Living   0 0 0 0 0 0 1     Review of Systems  Constitutional: Negative for fever.  HENT: Negative for congestion and sore throat.   Eyes: Negative.   Respiratory: Negative for chest tightness and shortness of breath.   Cardiovascular: Negative for chest pain.  Gastrointestinal: Positive for nausea, vomiting and abdominal pain. Negative for diarrhea and blood in stool.  Genitourinary: Negative.   Musculoskeletal: Negative for joint swelling, arthralgias and neck pain.  Skin: Negative.  Negative for rash and wound.  Neurological: Negative for dizziness, weakness, light-headedness, numbness and headaches.  Psychiatric/Behavioral: Negative.       Allergies  Bactrim and Hydrocodone  Home Medications   Prior to Admission medications   Medication Sig Start Date End Date Taking? Authorizing Provider  albuterol (PROVENTIL HFA;VENTOLIN HFA) 108 (90 BASE) MCG/ACT inhaler Inhale 1 puff into the lungs every 4 (four) hours as needed for wheezing or shortness of  breath. whezzing 08/02/14  Yes Historical Provider, MD  citalopram (CELEXA) 20 MG tablet Take 20 mg by mouth daily. 05/16/15  Yes Historical Provider, MD  ibuprofen (ADVIL,MOTRIN) 200 MG tablet Take 400 mg by mouth every 6 (six) hours as needed for moderate pain.    Yes Historical Provider, MD  oxcarbazepine (TRILEPTAL) 600 MG tablet Take 600 mg by mouth 2 (two) times daily. 04/12/15  Yes Historical Provider, MD  pantoprazole (PROTONIX) 40 MG tablet Take 40 mg by mouth 2 (two) times daily. 08/02/14  Yes Historical Provider, MD  traZODone (DESYREL) 100 MG tablet Take 150 mg by  mouth at bedtime.    Yes Historical Provider, MD  famotidine (PEPCID) 20 MG tablet Take 1 tablet (20 mg total) by mouth 2 (two) times daily. 06/10/15   Burgess AmorJulie Natale Thoma, PA-C  ondansetron (ZOFRAN ODT) 8 MG disintegrating tablet Take 1 tablet (8 mg total) by mouth every 8 (eight) hours as needed for nausea or vomiting. 06/10/15   Burgess AmorJulie Houston Surges, PA-C   BP 117/66 mmHg  Pulse 75  Temp(Src) 98.4 F (36.9 C) (Oral)  Resp 18  Ht 5' (1.524 m)  Wt 72.576 kg  BMI 31.25 kg/m2  SpO2 99%  LMP 05/18/2015 Physical Exam  Constitutional: She appears well-developed and well-nourished.  HENT:  Head: Normocephalic and atraumatic.  Eyes: Conjunctivae are normal.  Neck: Normal range of motion.  Cardiovascular: Normal rate, regular rhythm, normal heart sounds and intact distal pulses.   Pulmonary/Chest: Effort normal and breath sounds normal. She has no wheezes.  Abdominal: Soft. Bowel sounds are normal. She exhibits no distension and no mass. There is no hepatosplenomegaly. There is tenderness in the epigastric area. There is guarding.  Musculoskeletal: Normal range of motion.  Neurological: She is alert.  Skin: Skin is warm and dry.  Psychiatric: She has a normal mood and affect.  Nursing note and vitals reviewed.   ED Course  Procedures (including critical care time) Labs Review Labs Reviewed  URINALYSIS, ROUTINE W REFLEX MICROSCOPIC (NOT AT [facility]) - Abnormal; Notable for the following:    Leukocytes, UA TRACE (*)    All other components within normal limits  URINE MICROSCOPIC-ADD ON - Abnormal; Notable for the following:    Squamous Epithelial / LPF TOO NUMEROUS TO COUNT (*)    Bacteria, UA MANY (*)    Crystals CA OXALATE CRYSTALS (*)    All other components within normal limits  COMPREHENSIVE METABOLIC PANEL  LIPASE, BLOOD  CBC WITH DIFFERENTIAL/PLATELET  PREGNANCY, URINE    Imaging Review Dg Abd Acute W/chest  06/10/2015  CLINICAL DATA:  Acute onset sharp abdominal pain and vomiting for 2  days. Initial encounter. EXAM: DG ABDOMEN ACUTE W/ 1V CHEST COMPARISON:  Chest and abdominal radiographs, and CT of the abdomen and pelvis, performed 02/06/2015 FINDINGS: The lungs are well-aerated. Mild bibasilar atelectasis is noted. There is no evidence of pleural effusion or pneumothorax. The cardiomediastinal silhouette is within normal limits. The visualized bowel gas pattern is unremarkable. Scattered stool and air are seen within the colon; there is no evidence of small bowel dilatation to suggest obstruction. No free intra-abdominal air is identified on the provided upright view. No acute osseous abnormalities are seen; the sacroiliac joints are unremarkable in appearance. IMPRESSION: 1. Unremarkable bowel gas pattern; no free intra-abdominal air seen. Small to moderate amount of stool noted in the colon. 2. Mild bibasilar atelectasis noted.  Lungs otherwise clear. Electronically Signed   By: Roanna RaiderJeffery  Chang M.D.   On: 06/10/2015 01:30  I have personally reviewed and evaluated these images and lab results as part of my medical decision-making.   EKG Interpretation None      MDM   Final diagnoses:  Gastritis    Pt given IV fluids, zofran and morphine per IV with no relief of pain, improvement but not complete relief of nausea.  Pepcid IV given, GI cocktail given with numb feeling in throat, minimal relief of abd pain.   Patients  labs reviewed.  Radiological studies were viewed, interpreted and considered during the medical decision making and disposition process. I agree with radiologists reading.  Results were also discussed with patient. Normal findings, suspect viral gastroenteritis.  Pt prescribed zofran, pepcid.  Advised to f/u with pcp if sx persist, returning for any worsened sx.     Burgess Amor, PA-C 06/10/15 [id number]  Raeford Razor, MD 06/10/15 [phone]

## 2015-06-10 ENCOUNTER — Emergency Department (HOSPITAL_COMMUNITY): Payer: 59

## 2015-06-10 DIAGNOSIS — K2970 Gastritis, unspecified, without bleeding: Secondary | ICD-10-CM | POA: Diagnosis not present

## 2015-06-10 MED ORDER — ONDANSETRON 8 MG PO TBDP: 8.0000 mg | ORAL_TABLET | Freq: Three times a day (TID) | ORAL | Status: DC | PRN

## 2015-06-10 MED ORDER — GI COCKTAIL ~~LOC~~
30.0000 mL | Freq: Once | ORAL | Status: AC
  Administered 2015-06-10: 30 mL via ORAL
  Filled 2015-06-10: qty 30

## 2015-06-10 MED ORDER — FAMOTIDINE 20 MG PO TABS: 20.0000 mg | ORAL_TABLET | Freq: Two times a day (BID) | ORAL | Status: DC

## 2015-06-10 NOTE — ED Notes (Signed)
Patient given discharge instruction, verbalized understand. IV removed, band aid applied. Patient ambulatory out of the department.  

## 2015-06-10 NOTE — Discharge Instructions (Signed)
Gastritis, Adult Gastritis is soreness and puffiness (inflammation) of the lining of the stomach. If you do not get help, gastritis can cause bleeding and sores (ulcers) in the stomach. HOME CARE   Only take medicine as told by your doctor.  If you were given antibiotic medicines, take them as told. Finish the medicines even if you start to feel better.  Drink enough fluids to keep your pee (urine) clear or pale yellow.  Avoid foods and drinks that make your problems worse. Foods you may want to avoid include:  Caffeine or alcohol.  Chocolate.  Mint.  Garlic and onions.  Spicy foods.  Citrus fruits, including oranges, lemons, or limes.  Food containing tomatoes, including sauce, chili, salsa, and pizza.  Fried and fatty foods.  Eat small meals throughout the day instead of large meals. GET HELP RIGHT AWAY IF:   You have black or dark red poop (stools).  You throw up (vomit) blood.   You cannot keep fluids down.  Your belly (abdominal) pain gets worse.  You have a fever.  You do not feel better after 1 week.  You have any other questions or concerns. MAKE SURE YOU:   Understand these instructions.  Will watch your condition.  Will get help right away if you are not doing well or get worse.   This information is not intended to replace advice given to you by your health care provider. Make sure you discuss any questions you have with your health care provider.   Document Released: 12/04/2007 Document Revised: 09/09/2011 Document Reviewed: 07/31/2011 Elsevier Interactive Patient Education [employer]2016 Elsevier Inc.

## 2015-07-02 NOTE — L&D Delivery Note (Signed)
Delivery Note At 3:26 AM a viable female was delivered via Vaginal, Spontaneous Delivery (Presentation: OA).  APGAR: 6, 8; weight: pending. Placenta status: delivered spontaneously intact Cord: 3 vessel with nuchal x1.  Anesthesia:  Epidural Episiotomy: None Lacerations: None Suture Repair: NA Est. Blood Loss (mL):  200  Mom to postpartum.  Baby to Couplet care / Skin to Skin. All instruments and gauze accounted for and sharps disposed of.  Andres Egericia Brein, MD, PGY-1, MPH 02/09/2016, 3:57 AM   OB FELLOW DELIVERY ATTESTATION  I was gloved and present for the delivery in its entirety, and I agree with the above resident's note.    Ernestina PennaNicholas Dezmond Downie, MD 7:48 AM

## 2015-07-13 ENCOUNTER — Ambulatory Visit: Payer: Medicare Other | Admitting: Adult Health

## 2015-07-14 ENCOUNTER — Encounter: Payer: Self-pay | Admitting: Adult Health

## 2015-07-14 ENCOUNTER — Ambulatory Visit (INDEPENDENT_AMBULATORY_CARE_PROVIDER_SITE_OTHER): Payer: Medicare Other | Admitting: Adult Health

## 2015-07-14 VITALS — BP 140/78 | HR 80 | Ht 60.0 in | Wt 171.0 lb

## 2015-07-14 DIAGNOSIS — R110 Nausea: Secondary | ICD-10-CM

## 2015-07-14 DIAGNOSIS — Z3490 Encounter for supervision of normal pregnancy, unspecified, unspecified trimester: Secondary | ICD-10-CM

## 2015-07-14 DIAGNOSIS — O09899 Supervision of other high risk pregnancies, unspecified trimester: Secondary | ICD-10-CM | POA: Insufficient documentation

## 2015-07-14 DIAGNOSIS — Z3201 Encounter for pregnancy test, result positive: Secondary | ICD-10-CM | POA: Diagnosis not present

## 2015-07-14 DIAGNOSIS — O3680X0 Pregnancy with inconclusive fetal viability, not applicable or unspecified: Secondary | ICD-10-CM

## 2015-07-14 HISTORY — DX: Nausea: R11.0

## 2015-07-14 HISTORY — DX: Encounter for supervision of normal pregnancy, unspecified, unspecified trimester: Z34.90

## 2015-07-14 LAB — POCT URINE PREGNANCY: Preg Test, Ur: POSITIVE — AB

## 2015-07-14 MED ORDER — PROMETHAZINE HCL 25 MG PO TABS: 25.0000 mg | ORAL_TABLET | Freq: Four times a day (QID) | ORAL | Status: DC | PRN

## 2015-07-14 MED ORDER — PRENATAL PLUS 27-1 MG PO TABS: 1.0000 | ORAL_TABLET | Freq: Every day | ORAL | Status: DC

## 2015-07-14 NOTE — Progress Notes (Signed)
Subjective:     Patient ID: Connie [name], female   DOB: Dec 26, 1990, 25 y.o.   MRN: [id number]  HPI Connie [name] is a 25 year old white female in for UPT, has had +HPT and has nausea, she sees [facility] for bipolar and is on celexa, trazodone and trileptal. No bleeding.  Review of Systems Patient denies any headaches, hearing loss, fatigue, blurred vision, shortness of breath, chest pain, abdominal pain, problems with bowel movements, urination, or intercourse. No joint pain or mood swings.See HPI for positives. Reviewed past medical,surgical, social and family history. Reviewed medications and allergies.     Objective:   Physical Exam BP 140/78 mmHg  Pulse 80  Ht 5' (1.524 m)  Wt 171 lb (77.565 kg)  BMI 33.40 kg/m2  LMP 05/18/2015 UPT +, about 8+1 week by LMP with EDD 02/22/16, Skin warm and dry. Neck: mid line trachea, normal thyroid, good ROM, no lymphadenopathy noted. Lungs: clear to ausculation bilaterally. Cardiovascular: regular rate and rhythm.abdomen soft non tender, try to decrease cigarettes to less than 6, discussed med with Dr Emelda FearFerguson to continue to take.    Assessment:     Pregnant Nausea Hx bipolar disorder     Plan:     Continue celexa,trazodone and tileptal Rx prenatal plus #30 take 1 daily with 11 refills Rx phenergan 25 mg #30 take 1 every 6 hours prn N/V with 1 refill Return in 1 week for dating [place] Review handout on first trimester

## 2015-07-14 NOTE — Patient Instructions (Signed)
First Trimester of Pregnancy The first trimester of pregnancy is from week 1 until the end of week 12 (months 1 through 3). A week after a sperm fertilizes an egg, the egg will implant on the wall of the uterus. This embryo will begin to develop into a baby. Genes from you and your partner are forming the baby. The female genes determine whether the baby is a boy or a girl. At 6-8 weeks, the eyes and face are formed, and the heartbeat can be seen on ultrasound. At the end of 12 weeks, all the baby's organs are formed.  Now that you are pregnant, you will want to do everything you can to have a healthy baby. Two of the most important things are to get good prenatal care and to follow your health care provider's instructions. Prenatal care is all the medical care you receive before the baby's birth. This care will help prevent, find, and treat any problems during the pregnancy and childbirth. BODY CHANGES Your body goes through many changes during pregnancy. The changes vary from woman to woman.   You may gain or lose a couple of pounds at first.  You may feel sick to your stomach (nauseous) and throw up (vomit). If the vomiting is uncontrollable, call your health care provider.  You may tire easily.  You may develop headaches that can be relieved by medicines approved by your health care provider.  You may urinate more often. Painful urination may mean you have a bladder infection.  You may develop heartburn as a result of your pregnancy.  You may develop constipation because certain hormones are causing the muscles that push waste through your intestines to slow down.  You may develop hemorrhoids or swollen, bulging veins (varicose veins).  Your breasts may begin to grow larger and become tender. Your nipples may stick out more, and the tissue that surrounds them (areola) may become darker.  Your gums may bleed and may be sensitive to brushing and flossing.  Dark spots or blotches (chloasma,  mask of pregnancy) may develop on your face. This will likely fade after the baby is born.  Your menstrual periods will stop.  You may have a loss of appetite.  You may develop cravings for certain kinds of food.  You may have changes in your emotions from day to day, such as being excited to be pregnant or being concerned that something may go wrong with the pregnancy and baby.  You may have more vivid and strange dreams.  You may have changes in your hair. These can include thickening of your hair, rapid growth, and changes in texture. Some women also have hair loss during or after pregnancy, or hair that feels dry or thin. Your hair will most likely return to normal after your baby is born. WHAT TO EXPECT AT YOUR PRENATAL VISITS During a routine prenatal visit:  You will be weighed to make sure you and the baby are growing normally.  Your blood pressure will be taken.  Your abdomen will be measured to track your baby's growth.  The fetal heartbeat will be listened to starting around week 10 or 12 of your pregnancy.  Test results from any previous visits will be discussed. Your health care provider may ask you:  How you are feeling.  If you are feeling the baby move.  If you have had any abnormal symptoms, such as leaking fluid, bleeding, severe headaches, or abdominal cramping.  If you are using any tobacco products,   including cigarettes, chewing tobacco, and electronic cigarettes.  If you have any questions. Other tests that may be performed during your first trimester include:  Blood tests to find your blood type and to check for the presence of any previous infections. They will also be used to check for low iron levels (anemia) and Rh antibodies. Later in the pregnancy, blood tests for diabetes will be done along with other tests if problems develop.  Urine tests to check for infections, diabetes, or protein in the urine.  An ultrasound to confirm the proper growth  and development of the baby.  An amniocentesis to check for possible genetic problems.  Fetal screens for spina bifida and Down syndrome.  You may need other tests to make sure you and the baby are doing well.  HIV (human immunodeficiency virus) testing. Routine prenatal testing includes screening for HIV, unless you choose not to have this test. HOME CARE INSTRUCTIONS  Medicines  Follow your health care provider's instructions regarding medicine use. Specific medicines may be either safe or unsafe to take during pregnancy.  Take your prenatal vitamins as directed.  If you develop constipation, try taking a stool softener if your health care provider approves. Diet  Eat regular, well-balanced meals. Choose a variety of foods, such as meat or vegetable-based protein, fish, milk and low-fat dairy products, vegetables, fruits, and whole grain breads and cereals. Your health care provider will help you determine the amount of weight gain that is right for you.  Avoid raw meat and uncooked cheese. These carry germs that can cause birth defects in the baby.  Eating four or five small meals rather than three large meals a day may help relieve nausea and vomiting. If you start to feel nauseous, eating a few soda crackers can be helpful. Drinking liquids between meals instead of during meals also seems to help nausea and vomiting.  If you develop constipation, eat more high-fiber foods, such as fresh vegetables or fruit and whole grains. Drink enough fluids to keep your urine clear or pale yellow. Activity and Exercise  Exercise only as directed by your health care provider. Exercising will help you:  Control your weight.  Stay in shape.  Be prepared for labor and delivery.  Experiencing pain or cramping in the lower abdomen or low back is a good sign that you should stop exercising. Check with your health care provider before continuing normal exercises.  Try to avoid standing for long  periods of time. Move your legs often if you must stand in one place for a long time.  Avoid heavy lifting.  Wear low-heeled shoes, and practice good posture.  You may continue to have sex unless your health care provider directs you otherwise. Relief of Pain or Discomfort  Wear a good support bra for breast tenderness.   Take warm sitz baths to soothe any pain or discomfort caused by hemorrhoids. Use hemorrhoid cream if your health care provider approves.   Rest with your legs elevated if you have leg cramps or low back pain.  If you develop varicose veins in your legs, wear support hose. Elevate your feet for 15 minutes, 3-4 times a day. Limit salt in your diet. Prenatal Care  Schedule your prenatal visits by the twelfth week of pregnancy. They are usually scheduled monthly at first, then more often in the last 2 months before delivery.  Write down your questions. Take them to your prenatal visits.  Keep all your prenatal visits as directed by your   health care provider. Safety  Wear your seat belt at all times when driving.  Make a list of emergency phone numbers, including numbers for family, friends, the hospital, and police and fire departments. General Tips  Ask your health care provider for a referral to a local prenatal education class. Begin classes no later than at the beginning of month 6 of your pregnancy.  Ask for help if you have counseling or nutritional needs during pregnancy. Your health care provider can offer advice or refer you to specialists for help with various needs.  Do not use hot tubs, steam rooms, or saunas.  Do not douche or use tampons or scented sanitary pads.  Do not cross your legs for long periods of time.  Avoid cat litter boxes and soil used by cats. These carry germs that can cause birth defects in the baby and possibly loss of the fetus by miscarriage or stillbirth.  Avoid all smoking, herbs, alcohol, and medicines not prescribed by  your health care provider. Chemicals in these affect the formation and growth of the baby.  Do not use any tobacco products, including cigarettes, chewing tobacco, and electronic cigarettes. If you need help quitting, ask your health care provider. You may receive counseling support and other resources to help you quit.  Schedule a dentist appointment. At home, brush your teeth with a soft toothbrush and be gentle when you floss. SEEK MEDICAL CARE IF:   You have dizziness.  You have mild pelvic cramps, pelvic pressure, or nagging pain in the abdominal area.  You have persistent nausea, vomiting, or diarrhea.  You have a bad smelling vaginal discharge.  You have pain with urination.  You notice increased swelling in your face, hands, legs, or ankles. SEEK IMMEDIATE MEDICAL CARE IF:   You have a fever.  You are leaking fluid from your vagina.  You have spotting or bleeding from your vagina.  You have severe abdominal cramping or pain.  You have rapid weight gain or loss.  You vomit blood or material that looks like coffee grounds.  You are exposed to [place] measles and have never had them.  You are exposed to fifth disease or chickenpox.  You develop a severe headache.  You have shortness of breath.  You have any kind of trauma, such as from a fall or a car accident.   This information is not intended to replace advice given to you by your health care provider. Make sure you discuss any questions you have with your health care provider.   Document Released: 06/11/2001 Document Revised: 07/08/2014 Document Reviewed: 04/27/2013 Elsevier Interactive Patient Education [employer] Elsevier Inc. Follow up in 1 week for dating [place]

## 2015-07-18 ENCOUNTER — Ambulatory Visit (INDEPENDENT_AMBULATORY_CARE_PROVIDER_SITE_OTHER): Payer: Medicare Other

## 2015-07-18 DIAGNOSIS — Z3A08 8 weeks gestation of pregnancy: Secondary | ICD-10-CM

## 2015-07-18 DIAGNOSIS — O3680X0 Pregnancy with inconclusive fetal viability, not applicable or unspecified: Secondary | ICD-10-CM

## 2015-07-18 NOTE — Progress Notes (Signed)
[place] 7+5wks single IUP w/ys, pos fht 152bpm,normal ov's bilat,crl 13.20mm

## 2015-07-23 ENCOUNTER — Emergency Department (HOSPITAL_COMMUNITY)
Admission: EM | Admit: 2015-07-23 | Discharge: 2015-07-23 | Disposition: A | Discharge status: Home / Self Care | Payer: 59 | Attending: Emergency Medicine | Admitting: Emergency Medicine

## 2015-07-23 ENCOUNTER — Encounter (HOSPITAL_COMMUNITY): Payer: Self-pay | Admitting: Emergency Medicine

## 2015-07-23 DIAGNOSIS — O99331 Smoking (tobacco) complicating pregnancy, first trimester: Secondary | ICD-10-CM | POA: Diagnosis not present

## 2015-07-23 DIAGNOSIS — Z87442 Personal history of urinary calculi: Secondary | ICD-10-CM | POA: Diagnosis not present

## 2015-07-23 DIAGNOSIS — F319 Bipolar disorder, unspecified: Secondary | ICD-10-CM | POA: Insufficient documentation

## 2015-07-23 DIAGNOSIS — Z3A01 Less than 8 weeks gestation of pregnancy: Secondary | ICD-10-CM | POA: Insufficient documentation

## 2015-07-23 DIAGNOSIS — Z8619 Personal history of other infectious and parasitic diseases: Secondary | ICD-10-CM | POA: Insufficient documentation

## 2015-07-23 DIAGNOSIS — Z79899 Other long term (current) drug therapy: Secondary | ICD-10-CM | POA: Diagnosis not present

## 2015-07-23 DIAGNOSIS — Z8742 Personal history of other diseases of the female genital tract: Secondary | ICD-10-CM | POA: Insufficient documentation

## 2015-07-23 DIAGNOSIS — E669 Obesity, unspecified: Secondary | ICD-10-CM | POA: Diagnosis not present

## 2015-07-23 DIAGNOSIS — F17210 Nicotine dependence, cigarettes, uncomplicated: Secondary | ICD-10-CM | POA: Diagnosis not present

## 2015-07-23 DIAGNOSIS — K219 Gastro-esophageal reflux disease without esophagitis: Secondary | ICD-10-CM | POA: Diagnosis not present

## 2015-07-23 DIAGNOSIS — O99341 Other mental disorders complicating pregnancy, first trimester: Secondary | ICD-10-CM | POA: Insufficient documentation

## 2015-07-23 DIAGNOSIS — O9989 Other specified diseases and conditions complicating pregnancy, childbirth and the puerperium: Secondary | ICD-10-CM | POA: Diagnosis present

## 2015-07-23 DIAGNOSIS — O99211 Obesity complicating pregnancy, first trimester: Secondary | ICD-10-CM | POA: Diagnosis not present

## 2015-07-23 DIAGNOSIS — R1030 Lower abdominal pain, unspecified: Secondary | ICD-10-CM | POA: Insufficient documentation

## 2015-07-23 DIAGNOSIS — O99611 Diseases of the digestive system complicating pregnancy, first trimester: Secondary | ICD-10-CM | POA: Insufficient documentation

## 2015-07-23 DIAGNOSIS — R109 Unspecified abdominal pain: Secondary | ICD-10-CM

## 2015-07-23 DIAGNOSIS — Z3491 Encounter for supervision of normal pregnancy, unspecified, first trimester: Secondary | ICD-10-CM

## 2015-07-23 DIAGNOSIS — Z87440 Personal history of urinary (tract) infections: Secondary | ICD-10-CM | POA: Diagnosis not present

## 2015-07-23 LAB — URINALYSIS, ROUTINE W REFLEX MICROSCOPIC
Bilirubin Urine: NEGATIVE
Glucose, UA: NEGATIVE mg/dL
Hgb urine dipstick: NEGATIVE
Ketones, ur: NEGATIVE mg/dL
Leukocytes, UA: NEGATIVE
Nitrite: NEGATIVE
Protein, ur: NEGATIVE mg/dL
Specific Gravity, Urine: 1.025 (ref 1.005–1.030)
pH: 6.5 (ref 5.0–8.0)

## 2015-07-23 MED ORDER — ACETAMINOPHEN 325 MG PO TABS
650.0000 mg | ORAL_TABLET | Freq: Once | ORAL | Status: AC
  Administered 2015-07-23: 650 mg via ORAL
  Filled 2015-07-23: qty 2

## 2015-07-23 NOTE — Discharge Instructions (Signed)
Take Tylenol as needed for pain. Use heat on the sore area 3 or 4 times a day.   Abdominal Pain, Adult Many things can cause abdominal pain. Usually, abdominal pain is not caused by a disease and will improve without treatment. It can often be observed and treated at home. Your health care provider will do a physical exam and possibly order blood tests and X-rays to help determine the seriousness of your pain. However, in many cases, more time must pass before a clear cause of the pain can be found. Before that point, your health care provider may not know if you need more testing or further treatment. HOME CARE INSTRUCTIONS Monitor your abdominal pain for any changes. The following actions may help to alleviate any discomfort you are experiencing:  Only take over-the-counter or prescription medicines as directed by your health care provider.  Do not take laxatives unless directed to do so by your health care provider.  Try a clear liquid diet (broth, tea, or water) as directed by your health care provider. Slowly move to a bland diet as tolerated. SEEK MEDICAL CARE IF:  You have unexplained abdominal pain.  You have abdominal pain associated with nausea or diarrhea.  You have pain when you urinate or have a bowel movement.  You experience abdominal pain that wakes you in the night.  You have abdominal pain that is worsened or improved by eating food.  You have abdominal pain that is worsened with eating fatty foods.  You have a fever. SEEK IMMEDIATE MEDICAL CARE IF:  Your pain does not go away within 2 hours.  You keep throwing up (vomiting).  Your pain is felt only in portions of the abdomen, such as the right side or the left lower portion of the abdomen.  You pass bloody or black tarry stools. MAKE SURE YOU:  Understand these instructions.  Will watch your condition.  Will get help right away if you are not doing well or get worse.   This information is not intended  to replace advice given to you by your health care provider. Make sure you discuss any questions you have with your health care provider.   Document Released: 03/27/2005 Document Revised: 03/08/2015 Document Reviewed: 02/24/2013 Elsevier Interactive Patient Education 2016 [employer].  Prenatal Care WHAT IS PRENATAL CARE?  Prenatal care is the process of caring for a pregnant woman before she gives birth. Prenatal care makes sure that she and her baby remain as healthy as possible throughout pregnancy. Prenatal care may be provided by a midwife, family practice health care provider, or a childbirth and pregnancy specialist (obstetrician). Prenatal care may include physical examinations, testing, treatments, and education on nutrition, lifestyle, and social support services. WHY IS PRENATAL CARE SO IMPORTANT?  Early and consistent prenatal care increases the chance that you and your baby will remain healthy throughout your pregnancy. This type of care also decreases a baby's risk of being born too early (prematurely), or being born smaller than expected (small for gestational age). Any underlying medical conditions you may have that could pose a risk during your pregnancy are discussed during prenatal care visits. You will also be monitored regularly for any new conditions that may arise during your pregnancy so they can be treated quickly and effectively. WHAT HAPPENS DURING PRENATAL CARE VISITS? Prenatal care visits may include the following: Discussion Tell your health care provider about any new signs or symptoms you have experienced since your last visit. These might include:  Nausea  or vomiting.  Increased or decreased level of energy.  Difficulty sleeping.  Back or leg pain.  Weight changes.  Frequent urination.  Shortness of breath with physical activity.  Changes in your skin, such as the development of a rash or itchiness.  Vaginal discharge or bleeding.  Feelings of  excitement or nervousness.  Changes in your baby's movements. You may want to write down any questions or topics you want to discuss with your health care provider and bring them with you to your appointment. Examination During your first prenatal care visit, you will likely have a complete physical exam. Your health care provider will often examine your vagina, cervix, and the position of your uterus, as well as check your heart, lungs, and other body systems. As your pregnancy progresses, your health care provider will measure the size of your uterus and your baby's position inside your uterus. He or she may also examine you for early signs of labor. Your prenatal visits may also include checking your blood pressure and, after about 10-12 weeks of pregnancy, listening to your baby's heartbeat. Testing Regular testing often includes:  Urinalysis. This checks your urine for glucose, protein, or signs of infection.  Blood count. This checks the levels of white and red blood cells in your body.  Tests for sexually transmitted infections (STIs). Testing for STIs at the beginning of pregnancy is routinely done and is required in many states.  Antibody testing. You will be checked to see if you are immune to certain illnesses, such as rubella, that can affect a developing fetus.  Glucose screen. Around 24-28 weeks of pregnancy, your blood glucose level will be checked for signs of gestational diabetes. Follow-up tests may be recommended.  Group B strep. This is a bacteria that is commonly found inside a woman's vagina. This test will inform your health care provider if you need an antibiotic to reduce the amount of this bacteria in your body prior to labor and childbirth.  Ultrasound. Many pregnant women undergo an ultrasound screening around 18-20 weeks of pregnancy to evaluate the health of the fetus and check for any developmental abnormalities.  HIV (human immunodeficiency virus) testing. Early  in your pregnancy, you will be screened for HIV. If you are at high risk for HIV, this test may be repeated during your third trimester of pregnancy. You may be offered other testing based on your age, personal or family medical history, or other factors.  HOW OFTEN SHOULD I PLAN TO SEE MY HEALTH CARE PROVIDER FOR PRENATAL CARE? Your prenatal care check-up schedule depends on any medical conditions you have before, or develop during, your pregnancy. If you do not have any underlying medical conditions, you will likely be seen for checkups:  Monthly, during the first 6 months of pregnancy.  Twice a month during months 7 and 8 of pregnancy.  Weekly starting in the 9th month of pregnancy and until delivery. If you develop signs of early labor or other concerning signs or symptoms, you may need to see your health care provider more often. Ask your health care provider what prenatal care schedule is best for you. WHAT CAN I DO TO KEEP MYSELF AND MY BABY AS HEALTHY AS POSSIBLE DURING MY PREGNANCY?  Take a prenatal vitamin containing 400 micrograms (0.4 mg) of folic acid every day. Your health care provider may also ask you to take additional vitamins such as iodine, vitamin D, iron, copper, and zinc.  Take 1500-2000 mg of calcium daily starting at  your 20th week of pregnancy until you deliver your baby.  Make sure you are up to date on your vaccinations. Unless directed otherwise by your health care provider:  You should receive a tetanus, diphtheria, and pertussis (Tdap) vaccination between the 27th and 36th week of your pregnancy, regardless of when your last Tdap immunization occurred. This helps protect your baby from whooping cough (pertussis) after he or she is born.  You should receive an annual inactivated influenza vaccine (IIV) to help protect you and your baby from influenza. This can be done at any point during your pregnancy.  Eat a well-rounded diet that includes:  Fresh fruits and  vegetables.  Lean proteins.  Calcium-rich foods such as milk, yogurt, hard cheeses, and dark, leafy greens.  Whole grain breads.  Do noteat seafood high in mercury, including:  Swordfish.  Tilefish.  Shark.  King mackerel.  More than 6 oz tuna per week.  Do not eat:  Raw or undercooked meats or eggs.  Unpasteurized foods, such as soft cheeses (brie, blue, or feta), juices, and milks.  Lunch meats.  Hot dogs that have not been heated until they are steaming.  Drink enough water to keep your urine clear or pale yellow. For many women, this may be 10 or more 8 oz glasses of water each day. Keeping yourself hydrated helps deliver nutrients to your baby and may prevent the start of pre-term uterine contractions.  Do not use any tobacco products including cigarettes, chewing tobacco, or electronic cigarettes. If you need help quitting, ask your health care provider.  Do not drink beverages containing alcohol. No safe level of alcohol consumption during pregnancy has been determined.  Do not use any illegal drugs. These can harm your developing baby or cause a miscarriage.  Ask your health care provider or pharmacist before taking any prescription or over-the-counter medicines, herbs, or supplements.  Limit your caffeine intake to no more than 200 mg per day.  Exercise. Unless told otherwise by your health care provider, try to get 30 minutes of moderate exercise most days of the week. Do not  do high-impact activities, contact sports, or activities with a high risk of falling, such as horseback riding or downhill skiing.  Get plenty of rest.  Avoid anything that raises your body temperature, such as hot tubs and saunas.  If you own a cat, do not empty its litter box. Bacteria contained in cat feces can cause an infection called toxoplasmosis. This can result in serious harm to the fetus.  Stay away from chemicals such as insecticides, lead, mercury, and cleaning or paint  products that contain solvents.  Do not have any X-rays taken unless medically necessary.  Take a childbirth and breastfeeding preparation class. Ask your health care provider if you need a referral or recommendation.   This information is not intended to replace advice given to you by your health care provider. Make sure you discuss any questions you have with your health care provider.   Document Released: 06/20/2003 Document Revised: 07/08/2014 Document Reviewed: 09/01/2013 Elsevier Interactive Patient Education [employer]

## 2015-07-23 NOTE — ED Notes (Signed)
Pt [redacted] weeks pregnant, reports lower abd pain since this morning.  Pt denies n/v/d or passing any blood at this time.  Pt alert and oriented.

## 2015-07-23 NOTE — ED Provider Notes (Signed)
CSN: [id number]     Arrival date & time 07/23/15  1512 History   First MD Initiated Contact with Patient 07/23/15 1531     Chief Complaint  Patient presents with  . Abdominal Pain     (Consider location/radiation/quality/duration/timing/severity/associated sxs/prior Treatment) HPI   Connie [name] is a 25 y.o. female who is G2, P1001, EDC 02/29/2016, presenting with lower abdominal pain described as sharp, intermittent since this morning. Sexual intercourse, was 3 weeks ago. She denies vaginal bleeding, vaginal drainage, back pain, weakness or dizziness. She has not had a couple occasions with this pregnancy. She had a screening ultrasound on 5 days ago. She has initiated prenatal care with her obstetrician. There are no other no modifying factors.   Past Medical History  Diagnosis Date  . GERD (gastroesophageal reflux disease)   . Bipolar 1 disorder (HCC)   . Depression   . Headache(784.0)   . Urinary tract infection   . Carpal tunnel syndrome on both sides     with preg  . Bipolar disorder (HCC)   . Abnormal Pap smear   . Vaginal discharge 03/11/2013  . RLQ abdominal pain 03/11/2013  . BV (bacterial vaginosis) 03/11/2013  . Ovarian cyst, right   . Obesity 06/17/2013  . Unspecified symptom associated with female genital organs 01/06/2014  . Kidney stones   . Pregnant 07/14/2015  . Nausea 07/14/2015   Past Surgical History  Procedure Laterality Date  . Tonsillectomy and adenoidectomy Bilateral   . Tonsillectomy    . Myringotomy     Family History  Problem Relation Age of Onset  . Hypertension Mother   . Hypertension Father   . Hypertension Maternal Aunt   . Hypertension Maternal Uncle   . Hypertension Paternal Aunt   . Hypertension Paternal Uncle   . Diabetes Maternal Grandfather   . Diabetes Paternal Grandmother   . Other Son     swallowing problems   Social History  Substance Use Topics  . Smoking status: Current Every Day Smoker -- 0.50 packs/day for 11 years     Types: Cigarettes    Last Attempt to Quit: 04/19/2012  . Smokeless tobacco: Never Used  . Alcohol Use: No   OB History    Gravida Para Term Preterm AB TAB SAB Ectopic Multiple Living   0 0 0 0 0 0 1     Review of Systems  All other systems reviewed and are negative.     Allergies  Sulfa antibiotics; Bactrim; and Hydrocodone  Home Medications   Prior to Admission medications   Medication Sig Start Date End Date Taking? Authorizing Provider  citalopram (CELEXA) 20 MG tablet Take 20 mg by mouth daily. 05/16/15  Yes Historical Provider, MD  omeprazole (PRILOSEC) 20 MG capsule Take 20 mg by mouth daily.   Yes Historical Provider, MD  oxcarbazepine (TRILEPTAL) 600 MG tablet Take 600 mg by mouth 2 (two) times daily. 04/12/15  Yes Historical Provider, MD  prenatal vitamin w/FE, FA (PRENATAL 1 + 1) 27-1 MG TABS tablet Take 1 tablet by mouth daily at 12 noon. 07/14/15  Yes Adline Potter, NP  promethazine (PHENERGAN) 25 MG tablet Take 1 tablet (25 mg total) by mouth every 6 (six) hours as needed for nausea or vomiting. 07/14/15  Yes Adline Potter, NP  traZODone (DESYREL) 100 MG tablet Take 150 mg by mouth at bedtime.     Historical Provider, MD   BP 130/74 mmHg  Pulse 94  Temp(Src) 99.6  F (37.6 C) (Oral)  Resp 18  Ht 5' (1.524 m)  Wt 171 lb (77.565 kg)  BMI 33.40 kg/m2  SpO2 98%  LMP 05/18/2015 (Exact Date) Physical Exam  Constitutional: She is oriented to person, place, and time. She appears well-developed and well-nourished.  HENT:  Head: Normocephalic and atraumatic.  Right Ear: External ear normal.  Left Ear: External ear normal.  Eyes: Conjunctivae and EOM are normal. Pupils are equal, round, and reactive to light.  Neck: Normal range of motion and phonation normal. Neck supple.  Cardiovascular: Normal rate, regular rhythm and normal heart sounds.   Pulmonary/Chest: Effort normal and breath sounds normal. She exhibits no bony tenderness.  Abdominal:  Soft. She exhibits no mass. There is tenderness (mild lumbar and suprapubic abdominal tenderness to palpation. No rebound tenderness.). There is no rebound and no guarding.  Musculoskeletal: Normal range of motion.  Neurological: She is alert and oriented to person, place, and time. No cranial nerve deficit or sensory deficit. She exhibits normal muscle tone. Coordination normal.  Skin: Skin is warm, dry and intact.  Psychiatric: She has a normal mood and affect. Her behavior is normal. Judgment and thought content normal.  Nursing note and vitals reviewed.   ED Course  Procedures (including critical care time) Medications  acetaminophen (TYLENOL) tablet 650 mg (650 mg Oral Given 07/23/15 1554)    Patient Vitals for the past 24 hrs:  BP Temp Temp src Pulse Resp SpO2 Height Weight  07/23/15 1528 130/74 mmHg 99.6 F (37.6 C) Oral 94 18 98 % 5' (1.524 m) 171 lb (77.565 kg)    5:16 PM Reevaluation with update and discussion. After initial assessment and treatment, an updated evaluation reveals she is comfortable now. No further complaints. Findings discussed with patient, all questions were answered. Jermiah Howton L   Labs Review Labs Reviewed  URINALYSIS, ROUTINE W REFLEX MICROSCOPIC (NOT AT [facility])    Imaging Review No results found. I have personally reviewed and evaluated these images and lab results as part of my medical decision-making.   EKG Interpretation None      MDM   Final diagnoses:  Abdominal pain, unspecified abdominal location  First trimester pregnancy    Nonspecific abdominal pain. Doubt UTI, pregnancy complication, serious bacterial infection or metabolic instability.  Nursing Notes Reviewed/ Care Coordinated Applicable Imaging Reviewed Interpretation of Laboratory Data incorporated into ED treatment  The patient appears reasonably screened and/or stabilized for discharge and I doubt any other medical condition or other [facility] requiring further screening,  evaluation, or treatment in the ED at this time prior to discharge.  Plan: Home Medications- Tylenol; Home Treatments- rest; return here if the recommended treatment, does not improve the symptoms; Recommended follow up- OB prn       Mancel Bale, MD 07/23/15 1722

## 2015-07-26 ENCOUNTER — Encounter: Payer: Self-pay | Admitting: Advanced Practice Midwife

## 2015-07-26 ENCOUNTER — Ambulatory Visit (INDEPENDENT_AMBULATORY_CARE_PROVIDER_SITE_OTHER): Payer: Medicare Other | Admitting: Advanced Practice Midwife

## 2015-07-26 ENCOUNTER — Other Ambulatory Visit (HOSPITAL_COMMUNITY)
Admission: RE | Admit: 2015-07-26 | Discharge: 2015-07-26 | Disposition: A | Discharge status: Home / Self Care | Payer: Medicaid Other | Source: Ambulatory Visit | Attending: Advanced Practice Midwife | Admitting: Advanced Practice Midwife

## 2015-07-26 VITALS — BP 120/80 | HR 91 | Wt 171.0 lb

## 2015-07-26 DIAGNOSIS — Z1389 Encounter for screening for other disorder: Secondary | ICD-10-CM

## 2015-07-26 DIAGNOSIS — O09291 Supervision of pregnancy with other poor reproductive or obstetric history, first trimester: Secondary | ICD-10-CM

## 2015-07-26 DIAGNOSIS — Z331 Pregnant state, incidental: Secondary | ICD-10-CM

## 2015-07-26 DIAGNOSIS — Z369 Encounter for antenatal screening, unspecified: Secondary | ICD-10-CM

## 2015-07-26 DIAGNOSIS — Z3491 Encounter for supervision of normal pregnancy, unspecified, first trimester: Secondary | ICD-10-CM

## 2015-07-26 DIAGNOSIS — F1421 Cocaine dependence, in remission: Secondary | ICD-10-CM

## 2015-07-26 DIAGNOSIS — Z3682 Encounter for antenatal screening for nuchal translucency: Secondary | ICD-10-CM

## 2015-07-26 DIAGNOSIS — Z01419 Encounter for gynecological examination (general) (routine) without abnormal findings: Secondary | ICD-10-CM | POA: Insufficient documentation

## 2015-07-26 DIAGNOSIS — Z113 Encounter for screening for infections with a predominantly sexual mode of transmission: Secondary | ICD-10-CM | POA: Diagnosis present

## 2015-07-26 LAB — POCT URINALYSIS DIPSTICK
Blood, UA: NEGATIVE
Glucose, UA: NEGATIVE
Ketones, UA: NEGATIVE
Leukocytes, UA: NEGATIVE
Nitrite, UA: NEGATIVE
Protein, UA: NEGATIVE

## 2015-07-26 NOTE — Patient Instructions (Signed)
Safe Medications in Pregnancy   Acne: Benzoyl Peroxide Salicylic Acid  Backache/Headache: Tylenol: 2 regular strength every 4 hours OR              2 Extra strength every 6 hours  Colds/Coughs/Allergies: Benadryl (alcohol free) 25 mg every 6 hours as needed Breath right strips Claritin Cepacol throat lozenges Chloraseptic throat spray Cold-Eeze- up to three times per day Cough drops, alcohol free Flonase (by prescription only) Guaifenesin Mucinex Robitussin DM (plain only, alcohol free) Saline nasal spray/drops Sudafed (pseudoephedrine) & Actifed ** use only after [redacted] weeks gestation and if you do not have high blood pressure Tylenol Vicks Vaporub Zinc lozenges Zyrtec   Constipation: Colace Ducolax suppositories Fleet enema Glycerin suppositories Metamucil Milk of magnesia Miralax Senokot Smooth move tea  Diarrhea: Kaopectate Imodium A-D  *NO pepto Bismol  Hemorrhoids: Anusol Anusol HC Preparation H Tucks  Indigestion: Tums Maalox Mylanta Zantac  Pepcid  Insomnia: Benadryl (alcohol free) 25mg every 6 hours as needed Tylenol PM Unisom, no Gelcaps  Leg Cramps: Tums MagGel  Nausea/Vomiting:  Bonine Dramamine Emetrol Ginger extract Sea bands Meclizine  Nausea medication to take during pregnancy:  Unisom (doxylamine succinate 25 mg tablets) Take one tablet daily at bedtime. If symptoms are not adequately controlled, the dose can be increased to a maximum recommended dose of two tablets daily (1/2 tablet in the morning, 1/2 tablet mid-afternoon and one at bedtime). Vitamin B6 100mg tablets. Take one tablet twice a day (up to 200 mg per day).  Skin Rashes: Aveeno products Benadryl cream or 25mg every 6 hours as needed Calamine Lotion 1% cortisone cream  Yeast infection: Gyne-lotrimin 7 Monistat 7   **If taking multiple medications, please check labels to avoid duplicating the same active ingredients **take medication as directed on  the label ** Do not exceed 4000 mg of tylenol in 24 hours **Do not take medications that contain aspirin or ibuprofen    First Trimester of Pregnancy The first trimester of pregnancy is from week 1 until the end of week 12 (months 1 through 3). A week after a sperm fertilizes an egg, the egg will implant on the wall of the uterus. This embryo will begin to develop into a baby. Genes from you and your partner are forming the baby. The female genes determine whether the baby is a boy or a girl. At 6-8 weeks, the eyes and face are formed, and the heartbeat can be seen on ultrasound. At the end of 12 weeks, all the baby's organs are formed.  Now that you are pregnant, you will want to do everything you can to have a healthy baby. Two of the most important things are to get good prenatal care and to follow your health care provider's instructions. Prenatal care is all the medical care you receive before the baby's birth. This care will help prevent, find, and treat any problems during the pregnancy and childbirth. BODY CHANGES Your body goes through many changes during pregnancy. The changes vary from woman to woman.   You may gain or lose a couple of pounds at first.  You may feel sick to your stomach (nauseous) and throw up (vomit). If the vomiting is uncontrollable, call your health care provider.  You may tire easily.  You may develop headaches that can be relieved by medicines approved by your health care provider.  You may urinate more often. Painful urination may mean you have a bladder infection.  You may develop heartburn as a result of your   pregnancy.  You may develop constipation because certain hormones are causing the muscles that push waste through your intestines to slow down.  You may develop hemorrhoids or swollen, bulging veins (varicose veins).  Your breasts may begin to grow larger and become tender. Your nipples may stick out more, and the tissue that surrounds them (areola)  may become darker.  Your gums may bleed and may be sensitive to brushing and flossing.  Dark spots or blotches (chloasma, mask of pregnancy) may develop on your face. This will likely fade after the baby is born.  Your menstrual periods will stop.  You may have a loss of appetite.  You may develop cravings for certain kinds of food.  You may have changes in your emotions from day to day, such as being excited to be pregnant or being concerned that something may go wrong with the pregnancy and baby.  You may have more vivid and strange dreams.  You may have changes in your hair. These can include thickening of your hair, rapid growth, and changes in texture. Some women also have hair loss during or after pregnancy, or hair that feels dry or thin. Your hair will most likely return to normal after your baby is born. WHAT TO EXPECT AT YOUR PRENATAL VISITS During a routine prenatal visit:  You will be weighed to make sure you and the baby are growing normally.  Your blood pressure will be taken.  Your abdomen will be measured to track your baby's growth.  The fetal heartbeat will be listened to starting around week 10 or 12 of your pregnancy.  Test results from any previous visits will be discussed. Your health care provider may ask you:  How you are feeling.  If you are feeling the baby move.  If you have had any abnormal symptoms, such as leaking fluid, bleeding, severe headaches, or abdominal cramping.  If you are using any tobacco products, including cigarettes, chewing tobacco, and electronic cigarettes.  If you have any questions. Other tests that may be performed during your first trimester include:  Blood tests to find your blood type and to check for the presence of any previous infections. They will also be used to check for low iron levels (anemia) and Rh antibodies. Later in the pregnancy, blood tests for diabetes will be done along with other tests if problems  develop.  Urine tests to check for infections, diabetes, or protein in the urine.  An ultrasound to confirm the proper growth and development of the baby.  An amniocentesis to check for possible genetic problems.  Fetal screens for spina bifida and Down syndrome.  You may need other tests to make sure you and the baby are doing well.  HIV (human immunodeficiency virus) testing. Routine prenatal testing includes screening for HIV, unless you choose not to have this test. HOME CARE INSTRUCTIONS  Medicines  Follow your health care provider's instructions regarding medicine use. Specific medicines may be either safe or unsafe to take during pregnancy.  Take your prenatal vitamins as directed.  If you develop constipation, try taking a stool softener if your health care provider approves. Diet  Eat regular, well-balanced meals. Choose a variety of foods, such as meat or vegetable-based protein, fish, milk and low-fat dairy products, vegetables, fruits, and whole grain breads and cereals. Your health care provider will help you determine the amount of weight gain that is right for you.  Avoid raw meat and uncooked cheese. These carry germs that can cause   birth defects in the baby.  Eating four or five small meals rather than three large meals a day may help relieve nausea and vomiting. If you start to feel nauseous, eating a few soda crackers can be helpful. Drinking liquids between meals instead of during meals also seems to help nausea and vomiting.  If you develop constipation, eat more high-fiber foods, such as fresh vegetables or fruit and whole grains. Drink enough fluids to keep your urine clear or pale yellow. Activity and Exercise  Exercise only as directed by your health care provider. Exercising will help you:  Control your weight.  Stay in shape.  Be prepared for labor and delivery.  Experiencing pain or cramping in the lower abdomen or low back is a good sign that you  should stop exercising. Check with your health care provider before continuing normal exercises.  Try to avoid standing for long periods of time. Move your legs often if you must stand in one place for a long time.  Avoid heavy lifting.  Wear low-heeled shoes, and practice good posture.  You may continue to have sex unless your health care provider directs you otherwise. Relief of Pain or Discomfort  Wear a good support bra for breast tenderness.   Take warm sitz baths to soothe any pain or discomfort caused by hemorrhoids. Use hemorrhoid cream if your health care provider approves.   Rest with your legs elevated if you have leg cramps or low back pain.  If you develop varicose veins in your legs, wear support hose. Elevate your feet for 15 minutes, 3-4 times a day. Limit salt in your diet. Prenatal Care  Schedule your prenatal visits by the twelfth week of pregnancy. They are usually scheduled monthly at first, then more often in the last 2 months before delivery.  Write down your questions. Take them to your prenatal visits.  Keep all your prenatal visits as directed by your health care provider. Safety  Wear your seat belt at all times when driving.  Make a list of emergency phone numbers, including numbers for family, friends, the hospital, and police and fire departments. General Tips  Ask your health care provider for a referral to a local prenatal education class. Begin classes no later than at the beginning of month 6 of your pregnancy.  Ask for help if you have counseling or nutritional needs during pregnancy. Your health care provider can offer advice or refer you to specialists for help with various needs.  Do not use hot tubs, steam rooms, or saunas.  Do not douche or use tampons or scented sanitary pads.  Do not cross your legs for long periods of time.  Avoid cat litter boxes and soil used by cats. These carry germs that can cause birth defects in the baby  and possibly loss of the fetus by miscarriage or stillbirth.  Avoid all smoking, herbs, alcohol, and medicines not prescribed by your health care provider. Chemicals in these affect the formation and growth of the baby.  Do not use any tobacco products, including cigarettes, chewing tobacco, and electronic cigarettes. If you need help quitting, ask your health care provider. You may receive counseling support and other resources to help you quit.  Schedule a dentist appointment. At home, brush your teeth with a soft toothbrush and be gentle when you floss. SEEK MEDICAL CARE IF:   You have dizziness.  You have mild pelvic cramps, pelvic pressure, or nagging pain in the abdominal area.  You have persistent   nausea, vomiting, or diarrhea.  You have a bad smelling vaginal discharge.  You have pain with urination.  You notice increased swelling in your face, hands, legs, or ankles. SEEK IMMEDIATE MEDICAL CARE IF:   You have a fever.  You are leaking fluid from your vagina.  You have spotting or bleeding from your vagina.  You have severe abdominal cramping or pain.  You have rapid weight gain or loss.  You vomit blood or material that looks like coffee grounds.  You are exposed to German measles and have never had them.  You are exposed to fifth disease or chickenpox.  You develop a severe headache.  You have shortness of breath.  You have any kind of trauma, such as from a fall or a car accident.   This information is not intended to replace advice given to you by your health care provider. Make sure you discuss any questions you have with your health care provider.   Document Released: 06/11/2001 Document Revised: 07/08/2014 Document Reviewed: 04/27/2013 Elsevier Interactive Patient Education 2016 [employer].  

## 2015-07-26 NOTE — Progress Notes (Signed)
Subjective:    Connie [name] is a G2P1001 [redacted]w[redacted]d being seen today for her first obstetrical visit.  Her obstetrical history is significant for IOL for GHTN, with postpartum preclampsia (admitted to [facility]). .  Shortly after delivery, she began acting erratically, and was later dx with bipolar disorder.  She was homeless and her baby was in the custody of her cousin, and pretended to hear voices so that she could be admitted to the hospital. She has had multiple inpatient admissions for mood disorders, SI. (many notes in Care Everywhere). Follow up with outpt pshchiatrists has been decent.  She has a hx of crack cocaine and narcotic abuse. Denies using drugs now. Lives with mom and sister. Not involved with FOB. States "he's not a very good person."  Would not elaborate. She states that her meds are working well now.   Patient reports no complaints.  Filed Vitals:   07/26/15 1458  BP: 120/80  Pulse: 91  Weight: 171 lb (77.565 kg)    HISTORY: OB History  Gravida Para Term Preterm AB SAB TAB Ectopic Multiple Living  0 0 0 0 0 0 1    # Outcome Date GA Lbr Len/2nd Weight Sex Delivery Anes PTL Lv  2 Current           1 Term 06/30/12 [redacted]w[redacted]d 05:51 / 02:53 6 lb 13.5 oz (3.104 kg) M Vag-Spont EPI  Y     Past Medical History  Diagnosis Date  . GERD (gastroesophageal reflux disease)   . Bipolar 1 disorder (HCC)   . Depression   . Headache(784.0)   . Urinary tract infection   . Carpal tunnel syndrome on both sides     with preg  . Bipolar disorder (HCC)   . Abnormal Pap smear   . Vaginal discharge 03/11/2013  . RLQ abdominal pain 03/11/2013  . BV (bacterial vaginosis) 03/11/2013  . Ovarian cyst, right   . Obesity 06/17/2013  . Unspecified symptom associated with female genital organs 01/06/2014  . Kidney stones   . Pregnant 07/14/2015  . Nausea 07/14/2015   Past Surgical History  Procedure Laterality Date  . Tonsillectomy and adenoidectomy Bilateral   . Tonsillectomy    .  Myringotomy     Family History  Problem Relation Age of Onset  . Hypertension Mother   . Hypertension Father   . Hypertension Maternal Aunt   . Hypertension Maternal Uncle   . Hypertension Paternal Aunt   . Hypertension Paternal Uncle   . Diabetes Maternal Grandfather   . Diabetes Paternal Grandmother   . Other Son     swallowing problems     Exam       Pelvic Exam:    Perineum: Normal Perineum   Vulva: normal   Vagina:  normal mucosa, normal discharge, no palpable nodules   Uterus Normal, Gravid, FH: 8     Cervix: normal   Adnexa: Not palpable   Urinary:  urethral meatus normal    System:     Skin: normal coloration and turgor, no rashes    Neurologic: oriented, normal, normal mood   Extremities: normal strength, tone, and muscle mass   HEENT PERRLA   Mouth/Teeth mucous membranes moist, fair dentition   Neck supple and no masses   Cardiovascular: regular rate and rhythm   Respiratory:  appears well, vitals normal, no respiratory distress, acyanotic   Abdomen: soft, non-tender;  FHR: 160 [place]          Assessment:  Pregnancy: G2P1001 Patient Active Problem List   Diagnosis Date Noted  . Supervision of normal pregnancy 07/14/2015  . Nausea 07/14/2015  . Cocaine dependence (HCC) 06/30/2014  . Severe episode of recurrent major depressive disorder, without psychotic features (HCC) 06/26/2014  . Obesity 06/17/2013  . Vaginal discharge 03/11/2013  . Hx of preeclampsia, prior pregnancy, currently pregnant 07/12/2012  . 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 Homeless 07/12/2012  . EPH (edema, proteinuria and hypertension of pregnancy) 07/04/2012  . Bipolar affective disorder (HCC) 07/02/2012  . Drug abuse 07/02/2012        Plan:     Initial labs drawn. Continue prenatal vitamins  Start ASA  After 12 weeks Problem list reviewed and updated  Reviewed n/v relief measures and warning s/s to report  Reviewed recommended weight gain based on pre-gravid BMI   Encouraged well-balanced diet Genetic Screening discussed Integrated Screen: requested.  Ultrasound discussed; fetal survey: requested.  Return in about 3 weeks (around 08/16/2015) for US:NT+1st IT, LROB.  CRESENZO-DISHMAN,Hannahmarie Asberry 07/26/2015

## 2015-07-26 NOTE — Progress Notes (Signed)
Pt states that she has been having "sharp Cramps".

## 2015-07-27 LAB — CBC
Hematocrit: 41.7 % (ref 34.0–46.6)
Hemoglobin: 14.5 g/dL (ref 11.1–15.9)
MCH: 32.8 pg (ref 26.6–33.0)
MCHC: 34.8 g/dL (ref 31.5–35.7)
MCV: 94 fL (ref 79–97)
Platelets: 314 10*3/uL (ref 150–379)
RBC: 4.42 x10E6/uL (ref 3.77–5.28)
RDW: 13.5 % (ref 12.3–15.4)
WBC: 12.5 10*3/uL — ABNORMAL HIGH (ref 3.4–10.8)

## 2015-07-27 LAB — MICROSCOPIC EXAMINATION: Casts: NONE SEEN /lpf

## 2015-07-27 LAB — PMP SCREEN PROFILE (10S), URINE
Amphetamine Screen, Ur: NEGATIVE ng/mL
Barbiturate Screen, Ur: NEGATIVE ng/mL
Benzodiazepine Screen, Urine: NEGATIVE ng/mL
Cannabinoids Ur Ql Scn: POSITIVE ng/mL
Cocaine(Metab.)Screen, Urine: NEGATIVE ng/mL
Creatinine(Crt), U: 141.9 mg/dL (ref 20.0–300.0)
Methadone Scn, Ur: NEGATIVE ng/mL
Opiate Scrn, Ur: NEGATIVE ng/mL
Oxycodone+Oxymorphone Ur Ql Scn: NEGATIVE ng/mL
PCP Scrn, Ur: NEGATIVE ng/mL
Ph of Urine: 5.8 (ref 4.5–8.9)
Propoxyphene, Screen: NEGATIVE ng/mL

## 2015-07-27 LAB — HIV ANTIBODY (ROUTINE TESTING W REFLEX): HIV Screen 4th Generation wRfx: NONREACTIVE

## 2015-07-27 LAB — URINALYSIS, ROUTINE W REFLEX MICROSCOPIC
Bilirubin, UA: NEGATIVE
Glucose, UA: NEGATIVE
Ketones, UA: NEGATIVE
Nitrite, UA: NEGATIVE
Protein, UA: NEGATIVE
RBC, UA: NEGATIVE
Specific Gravity, UA: 1.021 (ref 1.005–1.030)
Urobilinogen, Ur: 0.2 mg/dL (ref 0.2–1.0)
pH, UA: 6 (ref 5.0–7.5)

## 2015-07-27 LAB — ANTIBODY SCREEN: Antibody Screen: NEGATIVE

## 2015-07-27 LAB — RPR: RPR Ser Ql: NONREACTIVE

## 2015-07-27 LAB — ABO/RH: Rh Factor: POSITIVE

## 2015-07-27 LAB — URINE CULTURE: Organism ID, Bacteria: NO GROWTH

## 2015-07-27 LAB — RUBELLA SCREEN: Rubella Antibodies, IGG: 8.51 index (ref >0.99)

## 2015-07-27 LAB — VARICELLA ZOSTER ANTIBODY, IGG: Varicella zoster IgG: 1613 index (ref >165)

## 2015-07-27 LAB — HEPATITIS B SURFACE ANTIGEN: Hepatitis B Surface Ag: NEGATIVE

## 2015-07-31 LAB — CYTOLOGY - PAP

## 2015-08-04 IMAGING — CR DG PELVIS 1-2V
1 series · 1 of 1 positions shown · non-contrast
Comparison: CT of the abdomen and pelvis April 12, 2014

CLINICAL DATA: Vomiting.  Cough.

EXAM:
PELVIS - 1-2 VIEW

[view not recorded]
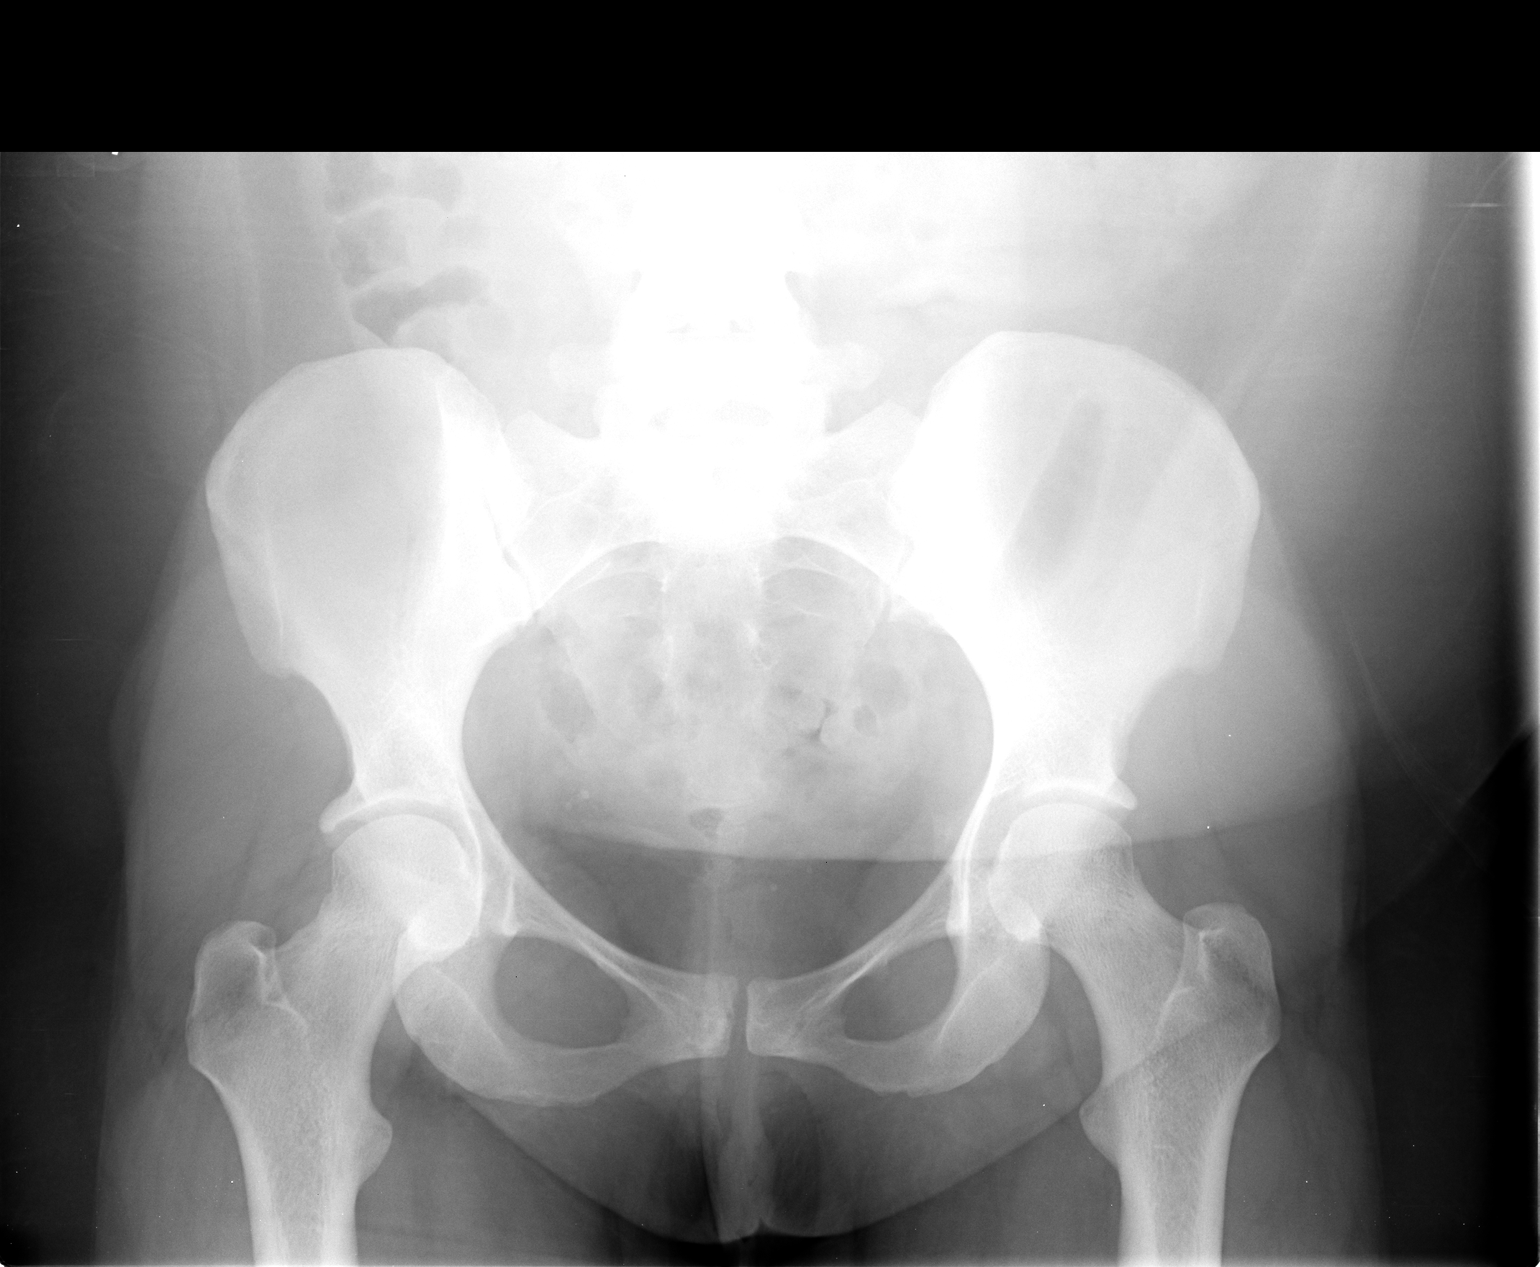

[1 of 1 positions shown; findings below may reference images not displayed]

FINDINGS: There is no evidence of pelvic fracture or diastasis. No pelvic bone
lesions are seen.
IMPRESSION: Negative.

  By: Aishah Hiller

## 2015-08-16 ENCOUNTER — Encounter: Payer: Self-pay | Admitting: Advanced Practice Midwife

## 2015-08-16 ENCOUNTER — Ambulatory Visit (INDEPENDENT_AMBULATORY_CARE_PROVIDER_SITE_OTHER): Payer: Medicare Other | Admitting: Advanced Practice Midwife

## 2015-08-16 ENCOUNTER — Ambulatory Visit (INDEPENDENT_AMBULATORY_CARE_PROVIDER_SITE_OTHER): Payer: Medicare Other

## 2015-08-16 VITALS — BP 142/80 | HR 84 | Wt 173.0 lb

## 2015-08-16 DIAGNOSIS — Z36 Encounter for antenatal screening of mother: Secondary | ICD-10-CM

## 2015-08-16 DIAGNOSIS — Z3491 Encounter for supervision of normal pregnancy, unspecified, first trimester: Secondary | ICD-10-CM

## 2015-08-16 DIAGNOSIS — Z3A12 12 weeks gestation of pregnancy: Secondary | ICD-10-CM

## 2015-08-16 DIAGNOSIS — Z331 Pregnant state, incidental: Secondary | ICD-10-CM

## 2015-08-16 DIAGNOSIS — Z3682 Encounter for antenatal screening for nuchal translucency: Secondary | ICD-10-CM

## 2015-08-16 DIAGNOSIS — O09291 Supervision of pregnancy with other poor reproductive or obstetric history, first trimester: Secondary | ICD-10-CM

## 2015-08-16 DIAGNOSIS — Z1389 Encounter for screening for other disorder: Secondary | ICD-10-CM

## 2015-08-16 LAB — POCT URINALYSIS DIPSTICK
Blood, UA: NEGATIVE
Glucose, UA: NEGATIVE
Ketones, UA: NEGATIVE
Leukocytes, UA: NEGATIVE
Nitrite, UA: NEGATIVE
Protein, UA: NEGATIVE

## 2015-08-16 NOTE — Progress Notes (Signed)
[place] 11+6 wks,crl 56.35mm,NB present,NT 1.16mm,normal ov's bilat,fhr 153 bpm

## 2015-08-16 NOTE — Progress Notes (Signed)
G2P1001 [redacted]w[redacted]d Estimated Date of Delivery: 02/29/16  Blood pressure 142/80, pulse 84, weight 173 lb (78.472 kg), last menstrual period 05/18/2015.   BP weight and urine results all reviewed and noted.  Please refer to the obstetrical flow sheet for the fundal height and fetal heart rate documentation: [place] 11+6 wks,crl 56.76mm,NB present,NT 1.98mm,normal ov's bilat,fhr 153 bpm   Patient denies any bleeding and no rupture of membranes symptoms or regular contractions. Patient is without complaints. All questions were answered.  Orders Placed This Encounter  Procedures  . Maternal Screen, Integrated #1  . POCT urinalysis dipstick    Plan:  Continued routine obstetrical care,   Return in about 4 weeks (around 09/13/2015) for LROB, 2nd IT.

## 2015-08-18 LAB — MATERNAL SCREEN, INTEGRATED #1
Crown Rump Length: 56 mm
Gest. Age on Collection Date: 12.1 weeks
Maternal Age at EDD: 26.7 years
Nuchal Translucency (NT): 1.5 mm
Number of Fetuses: 1
PAPP-A Value: 712.9 ng/mL
Weight: 173 [lb_av]

## 2015-09-13 ENCOUNTER — Encounter: Payer: Self-pay | Admitting: Women's Health

## 2015-09-13 ENCOUNTER — Ambulatory Visit (INDEPENDENT_AMBULATORY_CARE_PROVIDER_SITE_OTHER): Payer: Medicare Other | Admitting: Women's Health

## 2015-09-13 VITALS — BP 132/78 | HR 100 | Wt 173.0 lb

## 2015-09-13 DIAGNOSIS — Z331 Pregnant state, incidental: Secondary | ICD-10-CM

## 2015-09-13 DIAGNOSIS — F1290 Cannabis use, unspecified, uncomplicated: Secondary | ICD-10-CM

## 2015-09-13 DIAGNOSIS — F17200 Nicotine dependence, unspecified, uncomplicated: Secondary | ICD-10-CM

## 2015-09-13 DIAGNOSIS — Z23 Encounter for immunization: Secondary | ICD-10-CM

## 2015-09-13 DIAGNOSIS — Z1389 Encounter for screening for other disorder: Secondary | ICD-10-CM

## 2015-09-13 DIAGNOSIS — Z363 Encounter for antenatal screening for malformations: Secondary | ICD-10-CM

## 2015-09-13 DIAGNOSIS — N898 Other specified noninflammatory disorders of vagina: Secondary | ICD-10-CM

## 2015-09-13 DIAGNOSIS — Z3492 Encounter for supervision of normal pregnancy, unspecified, second trimester: Secondary | ICD-10-CM

## 2015-09-13 DIAGNOSIS — Z3682 Encounter for antenatal screening for nuchal translucency: Secondary | ICD-10-CM

## 2015-09-13 LAB — POCT URINALYSIS DIPSTICK
Blood, UA: NEGATIVE
Glucose, UA: NEGATIVE
Ketones, UA: NEGATIVE
Leukocytes, UA: NEGATIVE
Nitrite, UA: NEGATIVE
Protein, UA: NEGATIVE

## 2015-09-13 MED ORDER — PROMETHAZINE HCL 25 MG PO TABS: 25.0000 mg | ORAL_TABLET | Freq: Four times a day (QID) | ORAL | Status: DC | PRN

## 2015-09-13 NOTE — Patient Instructions (Addendum)
Begin taking a 81mg baby aspirin daily to decrease risk of preeclampsia during pregnancy   Second Trimester of Pregnancy The second trimester is from week 13 through week 28, months 4 through 6. The second trimester is often a time when you feel your best. Your body has also adjusted to being pregnant, and you begin to feel better physically. Usually, morning sickness has lessened or quit completely, you may have more energy, and you may have an increase in appetite. The second trimester is also a time when the fetus is growing rapidly. At the end of the sixth month, the fetus is about 9 inches long and weighs about 1 pounds. You will likely begin to feel the baby move (quickening) between 18 and 20 weeks of the pregnancy. BODY CHANGES Your body goes through many changes during pregnancy. The changes vary from woman to woman.   Your weight will continue to increase. You will notice your lower abdomen bulging out.  You may begin to get stretch marks on your hips, abdomen, and breasts.  You may develop headaches that can be relieved by medicines approved by your health care provider.  You may urinate more often because the fetus is pressing on your bladder.  You may develop or continue to have heartburn as a result of your pregnancy.  You may develop constipation because certain hormones are causing the muscles that push waste through your intestines to slow down.  You may develop hemorrhoids or swollen, bulging veins (varicose veins).  You may have back pain because of the weight gain and pregnancy hormones relaxing your joints between the bones in your pelvis and as a result of a shift in weight and the muscles that support your balance.  Your breasts will continue to grow and be tender.  Your gums may bleed and may be sensitive to brushing and flossing.  Dark spots or blotches (chloasma, mask of pregnancy) may develop on your face. This will likely fade after the baby is born.  A dark  line from your belly button to the pubic area (linea nigra) may appear. This will likely fade after the baby is born.  You may have changes in your hair. These can include thickening of your hair, rapid growth, and changes in texture. Some women also have hair loss during or after pregnancy, or hair that feels dry or thin. Your hair will most likely return to normal after your baby is born. WHAT TO EXPECT AT YOUR PRENATAL VISITS During a routine prenatal visit:  You will be weighed to make sure you and the fetus are growing normally.  Your blood pressure will be taken.  Your abdomen will be measured to track your baby's growth.  The fetal heartbeat will be listened to.  Any test results from the previous visit will be discussed. Your health care provider may ask you:  How you are feeling.  If you are feeling the baby move.  If you have had any abnormal symptoms, such as leaking fluid, bleeding, severe headaches, or abdominal cramping.  If you are using any tobacco products, including cigarettes, chewing tobacco, and electronic cigarettes.  If you have any questions. Other tests that may be performed during your second trimester include:  Blood tests that check for:  Low iron levels (anemia).  Gestational diabetes (between 24 and 28 weeks).  Rh antibodies.  Urine tests to check for infections, diabetes, or protein in the urine.  An ultrasound to confirm the proper growth and development of the   baby.  An amniocentesis to check for possible genetic problems.  Fetal screens for spina bifida and Down syndrome.  HIV (human immunodeficiency virus) testing. Routine prenatal testing includes screening for HIV, unless you choose not to have this test. HOME CARE INSTRUCTIONS   Avoid all smoking, herbs, alcohol, and unprescribed drugs. These chemicals affect the formation and growth of the baby.  Do not use any tobacco products, including cigarettes, chewing tobacco, and  electronic cigarettes. If you need help quitting, ask your health care provider. You may receive counseling support and other resources to help you quit.  Follow your health care provider's instructions regarding medicine use. There are medicines that are either safe or unsafe to take during pregnancy.  Exercise only as directed by your health care provider. Experiencing uterine cramps is a good sign to stop exercising.  Continue to eat regular, healthy meals.  Wear a good support bra for breast tenderness.  Do not use hot tubs, steam rooms, or saunas.  Wear your seat belt at all times when driving.  Avoid raw meat, uncooked cheese, cat litter boxes, and soil used by cats. These carry germs that can cause birth defects in the baby.  Take your prenatal vitamins.  Take 1500-2000 mg of calcium daily starting at the 20th week of pregnancy until you deliver your baby.  Try taking a stool softener (if your health care provider approves) if you develop constipation. Eat more high-fiber foods, such as fresh vegetables or fruit and whole grains. Drink plenty of fluids to keep your urine clear or pale yellow.  Take warm sitz baths to soothe any pain or discomfort caused by hemorrhoids. Use hemorrhoid cream if your health care provider approves.  If you develop varicose veins, wear support hose. Elevate your feet for 15 minutes, 3-4 times a day. Limit salt in your diet.  Avoid heavy lifting, wear low heel shoes, and practice good posture.  Rest with your legs elevated if you have leg cramps or low back pain.  Visit your dentist if you have not gone yet during your pregnancy. Use a soft toothbrush to brush your teeth and be gentle when you floss.  A sexual relationship may be continued unless your health care provider directs you otherwise.  Continue to go to all your prenatal visits as directed by your health care provider. SEEK MEDICAL CARE IF:   You have dizziness.  You have mild pelvic  cramps, pelvic pressure, or nagging pain in the abdominal area.  You have persistent nausea, vomiting, or diarrhea.  You have a bad smelling vaginal discharge.  You have pain with urination. SEEK IMMEDIATE MEDICAL CARE IF:   You have a fever.  You are leaking fluid from your vagina.  You have spotting or bleeding from your vagina.  You have severe abdominal cramping or pain.  You have rapid weight gain or loss.  You have shortness of breath with chest pain.  You notice sudden or extreme swelling of your face, hands, ankles, feet, or legs.  You have not felt your baby move in over an hour.  You have severe headaches that do not go away with medicine.  You have vision changes.   This information is not intended to replace advice given to you by your health care provider. Make sure you discuss any questions you have with your health care provider.   Document Released: 06/11/2001 Document Revised: 07/08/2014 Document Reviewed: 08/18/2012 Elsevier Interactive Patient Education 2016 [employer] Inc.   

## 2015-09-13 NOTE — Progress Notes (Addendum)
Low-risk OB appointment G2P1001 1937w6d Estimated Date of Delivery: 02/29/16 BP 132/78 mmHg  Pulse 100  Wt 173 lb (78.472 kg)  LMP 05/18/2015 (Exact Date)  BP, weight, and urine reviewed.  Refer to obstetrical flow sheet for FH & FHR.  No fm yet. Denies cramping, lof, vb, or uti s/s. No complaints. No THC in 2mths. Will recheck today. No cocaine in years. Currently taking celexa and trileptal for bipolar, was also taking trazadone prior to pregnancy. Sees [facility] q 3mths. Hasn't started baby asa for h/o pre-e yet as directed- to start today.  Reviewed warning s/s to report. Plan:  Continue routine obstetrical care  F/U in 3wks for OB appointment and anatomy u/s Flu shot and 2nd IT today

## 2015-09-14 LAB — PMP SCREEN PROFILE (10S), URINE
Amphetamine Screen, Ur: NEGATIVE ng/mL
Barbiturate Screen, Ur: NEGATIVE ng/mL
Benzodiazepine Screen, Urine: NEGATIVE ng/mL
Cannabinoids Ur Ql Scn: NEGATIVE ng/mL
Cocaine(Metab.)Screen, Urine: NEGATIVE ng/mL
Creatinine(Crt), U: 86.8 mg/dL (ref 20.0–300.0)
Methadone Scn, Ur: NEGATIVE ng/mL
Opiate Scrn, Ur: NEGATIVE ng/mL
Oxycodone+Oxymorphone Ur Ql Scn: NEGATIVE ng/mL
PCP Scrn, Ur: NEGATIVE ng/mL
Ph of Urine: 6.4 (ref 4.5–8.9)
Propoxyphene, Screen: NEGATIVE ng/mL

## 2015-09-15 LAB — MATERNAL SCREEN, INTEGRATED #2
AFP MoM: 0.89
Alpha-Fetoprotein: 25.4 ng/mL
Crown Rump Length: 56 mm
DIA MoM: 1.36
DIA Value: 220.2 pg/mL
Estriol, Unconjugated: 1.37 ng/mL
Gest. Age on Collection Date: 12.1 weeks
Gestational Age: 16.1 weeks
Maternal Age at EDD: 26.7 years
Nuchal Translucency (NT): 1.5 mm
Nuchal Translucency MoM: 1.03
Number of Fetuses: 1
PAPP-A MoM: 0.98
PAPP-A Value: 712.9 ng/mL
Test Results:: NEGATIVE
Weight: 173 [lb_av]
Weight: 173 [lb_av]
hCG MoM: 0.94
hCG Value: 30.2 IU/mL
uE3 MoM: 1.7

## 2015-10-04 ENCOUNTER — Ambulatory Visit (INDEPENDENT_AMBULATORY_CARE_PROVIDER_SITE_OTHER): Payer: Medicare Other

## 2015-10-04 ENCOUNTER — Ambulatory Visit (INDEPENDENT_AMBULATORY_CARE_PROVIDER_SITE_OTHER): Payer: Medicare Other | Admitting: Advanced Practice Midwife

## 2015-10-04 VITALS — BP 137/82 | HR 96 | Wt 174.5 lb

## 2015-10-04 DIAGNOSIS — Z3A19 19 weeks gestation of pregnancy: Secondary | ICD-10-CM

## 2015-10-04 DIAGNOSIS — Z363 Encounter for antenatal screening for malformations: Secondary | ICD-10-CM

## 2015-10-04 DIAGNOSIS — Z3492 Encounter for supervision of normal pregnancy, unspecified, second trimester: Secondary | ICD-10-CM

## 2015-10-04 DIAGNOSIS — O321XX1 Maternal care for breech presentation, fetus 1: Secondary | ICD-10-CM | POA: Diagnosis not present

## 2015-10-04 DIAGNOSIS — Z331 Pregnant state, incidental: Secondary | ICD-10-CM

## 2015-10-04 DIAGNOSIS — Z3482 Encounter for supervision of other normal pregnancy, second trimester: Secondary | ICD-10-CM

## 2015-10-04 DIAGNOSIS — Z36 Encounter for antenatal screening of mother: Secondary | ICD-10-CM | POA: Diagnosis not present

## 2015-10-04 DIAGNOSIS — Z1389 Encounter for screening for other disorder: Secondary | ICD-10-CM

## 2015-10-04 LAB — POCT URINALYSIS DIPSTICK
Glucose, UA: NEGATIVE
Ketones, UA: NEGATIVE
Nitrite, UA: NEGATIVE
Protein, UA: NEGATIVE

## 2015-10-04 NOTE — Progress Notes (Signed)
G2P1001 445w6d Estimated Date of Delivery: 02/29/16  Blood pressure 137/82, pulse 96, weight 174 lb 8 oz (79.153 kg), last menstrual period 05/18/2015.   BP weight and urine results all reviewed and noted.  Please refer to the obstetrical flow sheet for the fundal height and fetal heart rate documentation:US 18+6 wks,breech,ant pl gr 0, svp of fluid 5.2 cm,cx 4.4 cm,normal ov's bilat,efw 291 g, fhr 138 bpm,measurements c/w dates, anatomy complete no obvious abnormalities seen  Patient reports good fetal movement, denies any bleeding and no rupture of membranes symptoms or regular contractions. Patient is without complaints. All questions were answered.  Orders Placed This Encounter  Procedures  . POCT urinalysis dipstick    Plan:  Continued routine obstetrical care  Return in about 4 weeks (around 11/01/2015) for LROB.

## 2015-10-04 NOTE — Progress Notes (Signed)
US 18+6 wks,breech,ant pl gr 0, svp of fluid 5.2 cm,cx 4.4 cm,normal ov's bilat,efw 291 g, fhr 138 bpm,measurements c/w dates, anatomy complete no obvious abnormalities seen

## 2015-10-13 IMAGING — DX DG WRIST COMPLETE 3+V*R*
4 series · 4 of 4 positions shown · non-contrast
Comparison: None.

CLINICAL DATA: Right wrist pain and swelling.  No trauma.

EXAM:
RIGHT WRIST - COMPLETE 3+ VIEW

[wrist pa]
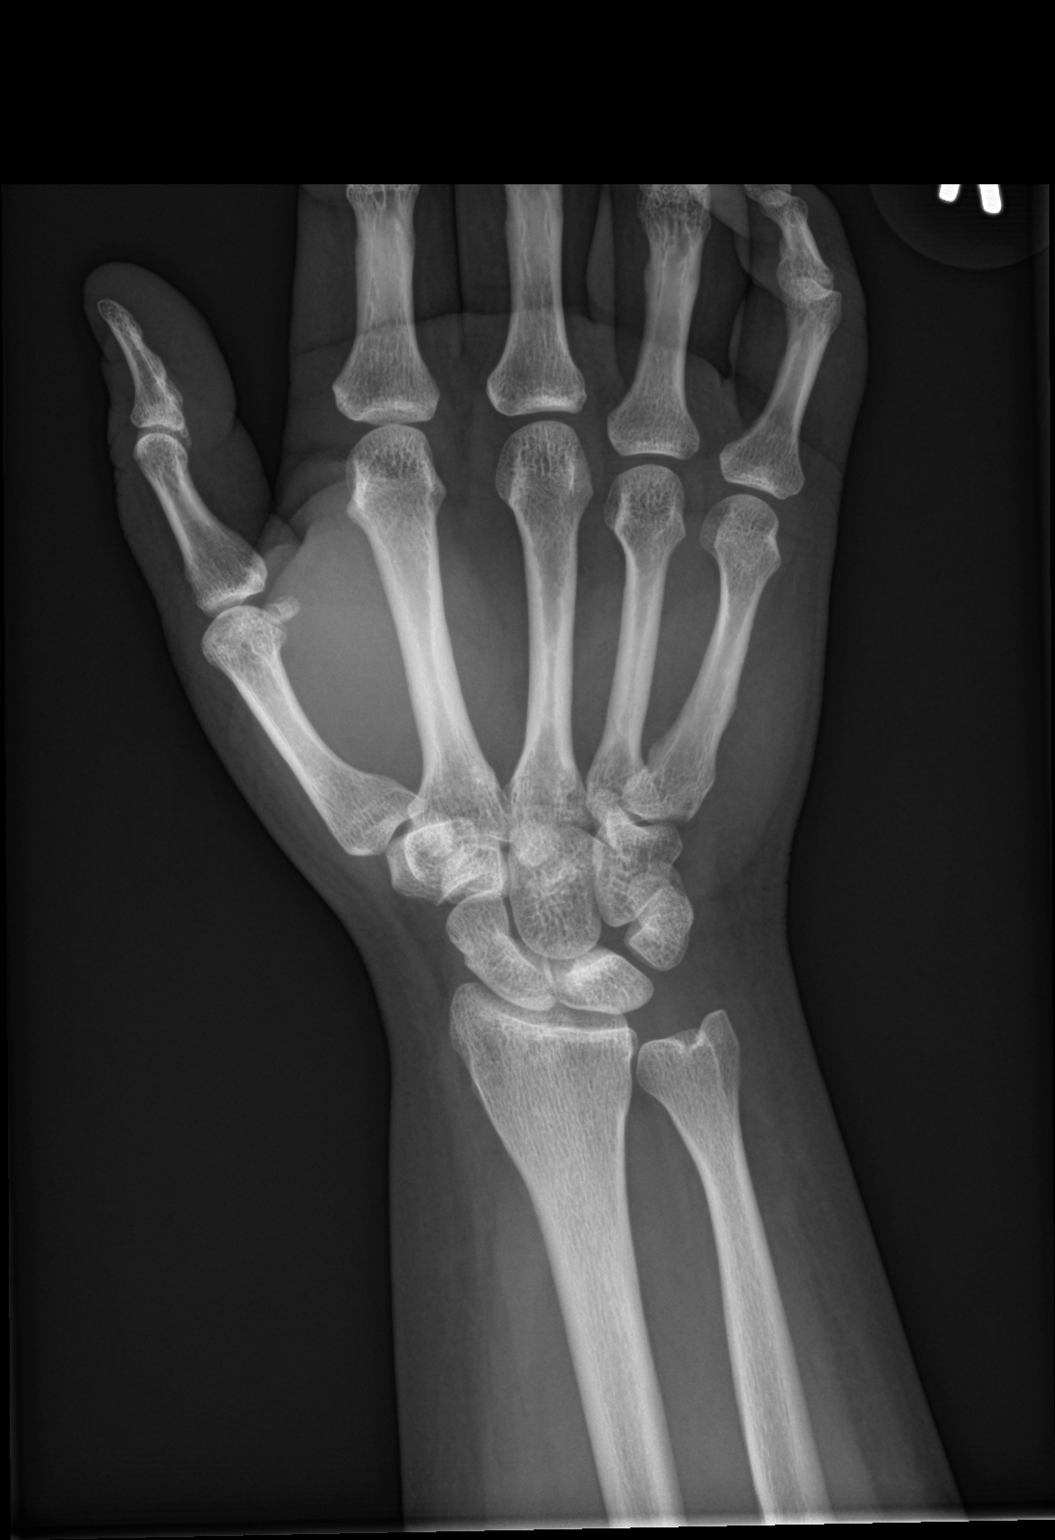

[wrist obl]
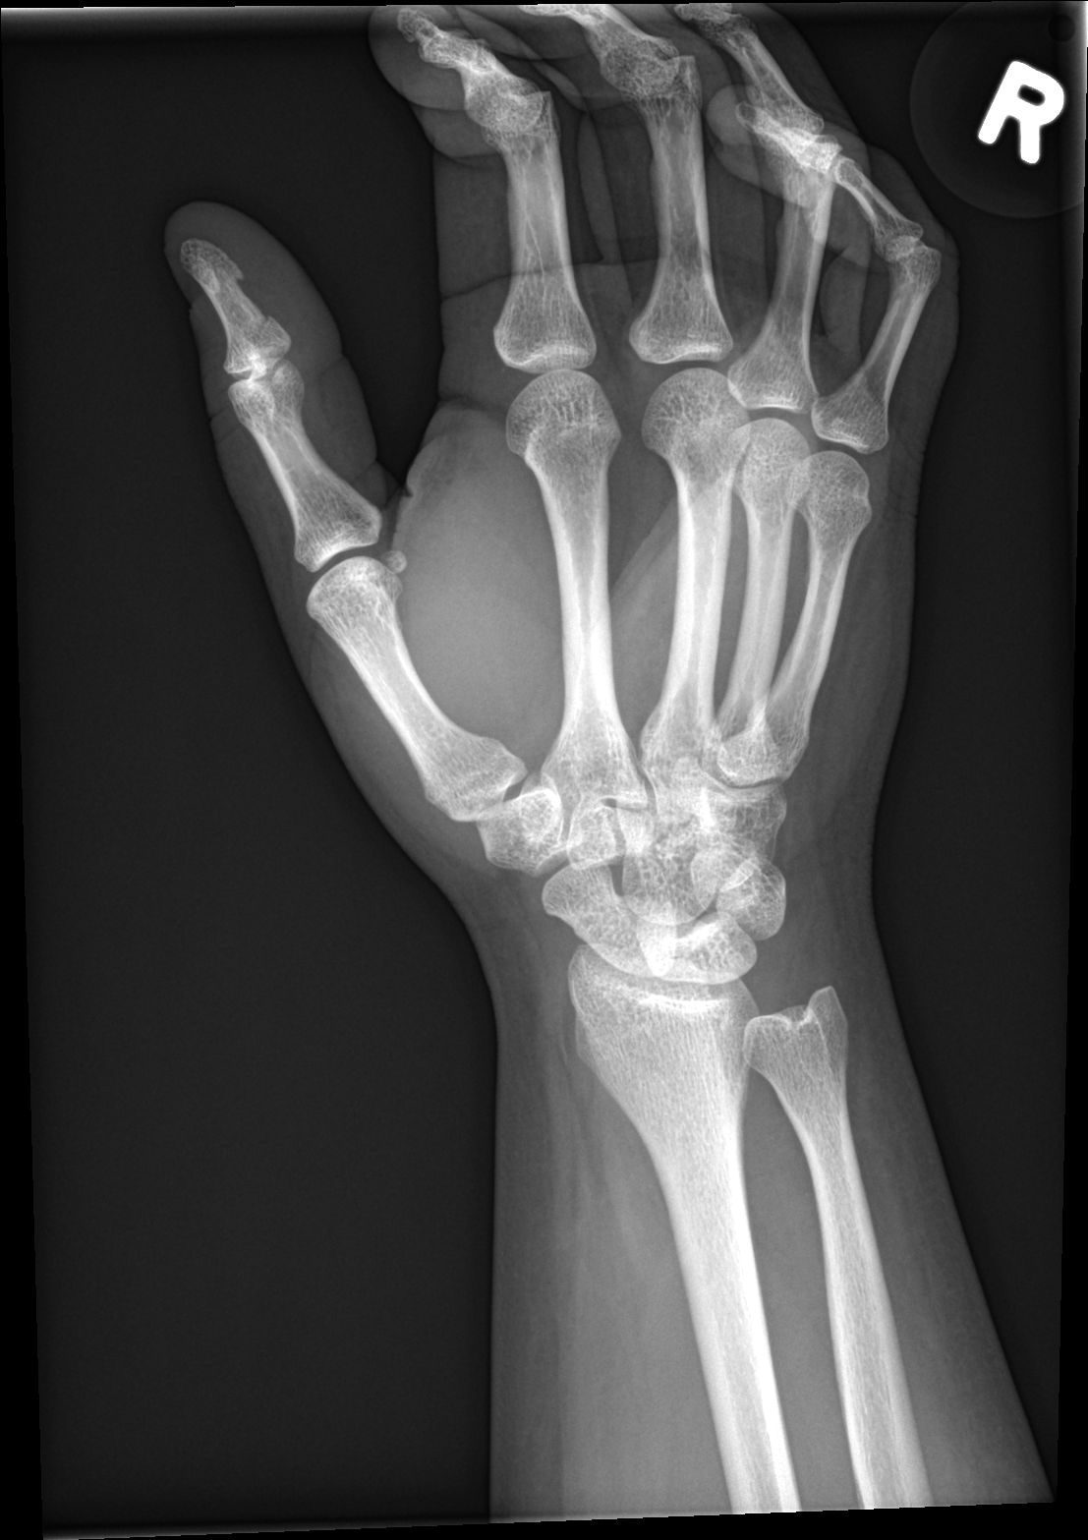

[wrist lat]
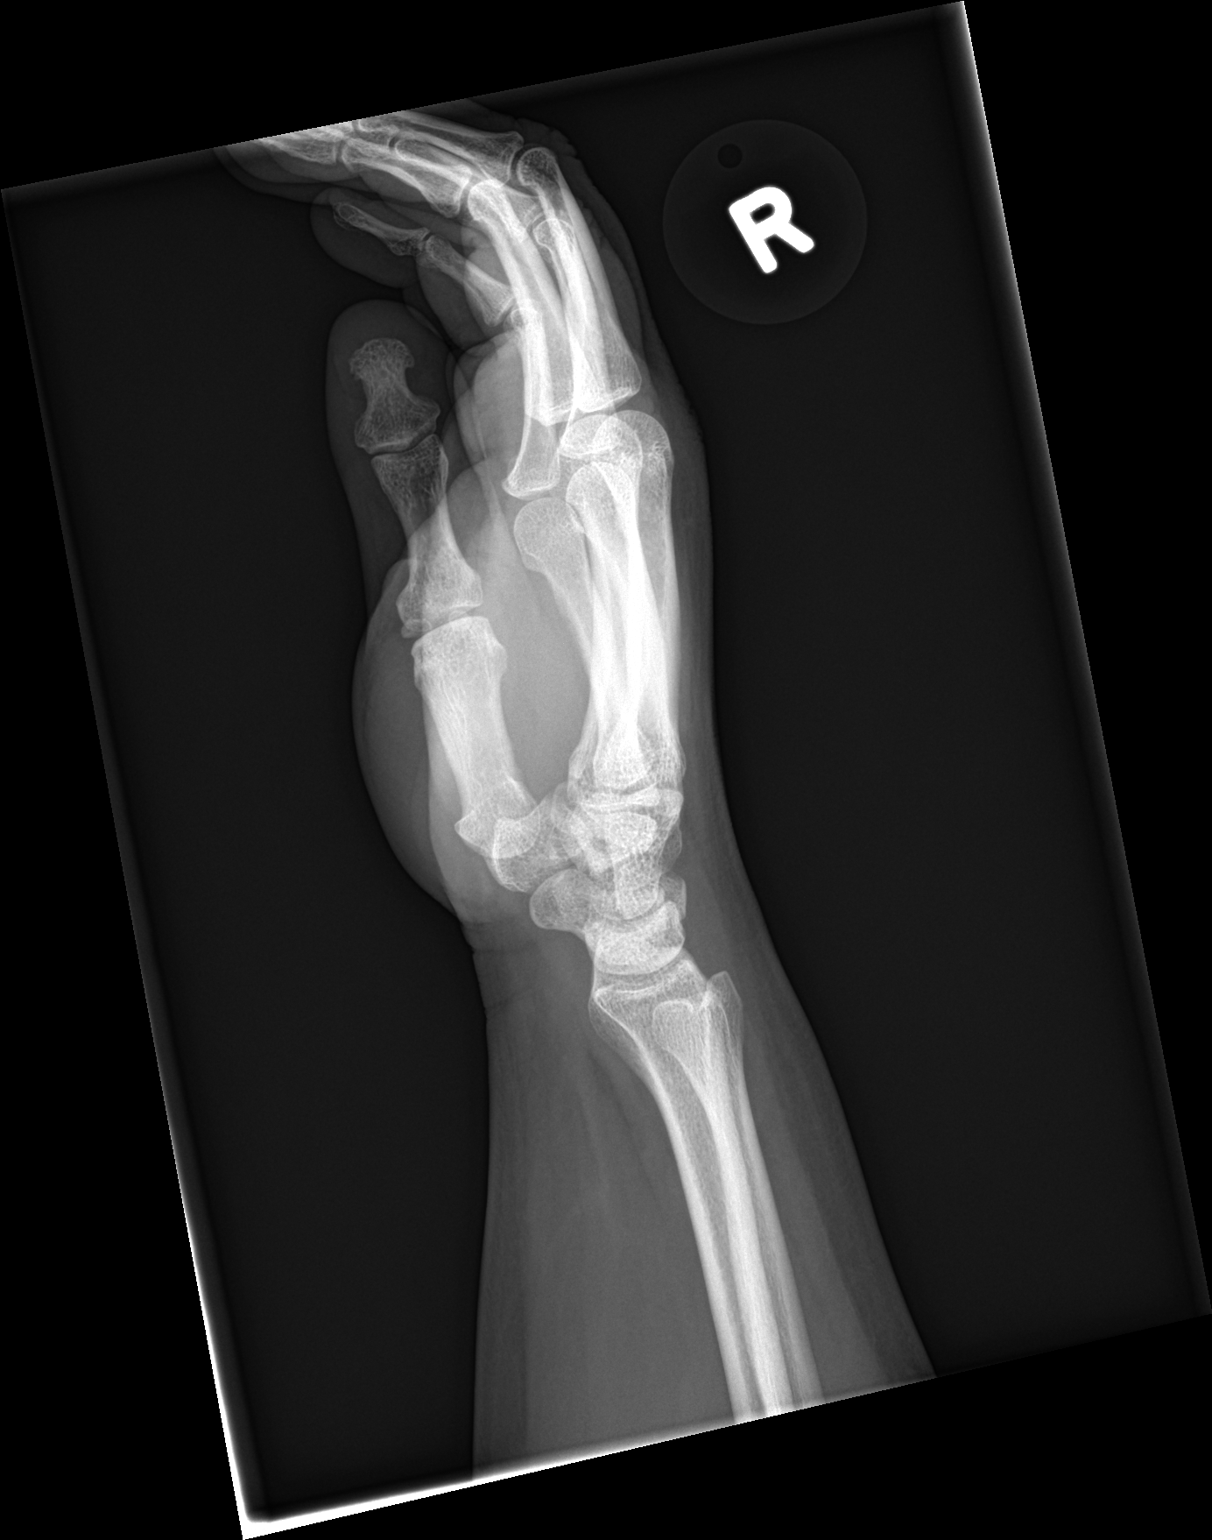

[wrist navicular]
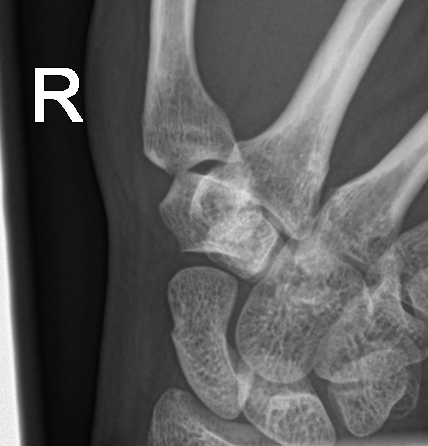

[4 of 4 positions shown; findings below may reference images not displayed]

FINDINGS: There is no evidence of fracture or dislocation. There is no
evidence of arthropathy or other focal bone abnormality. Soft
tissues are unremarkable.
IMPRESSION: Normal exam.

## 2015-11-01 ENCOUNTER — Ambulatory Visit (INDEPENDENT_AMBULATORY_CARE_PROVIDER_SITE_OTHER): Payer: Medicare Other | Admitting: Women's Health

## 2015-11-01 ENCOUNTER — Encounter: Payer: Self-pay | Admitting: Women's Health

## 2015-11-01 VITALS — BP 134/78 | HR 96 | Wt 177.0 lb

## 2015-11-01 DIAGNOSIS — Z331 Pregnant state, incidental: Secondary | ICD-10-CM

## 2015-11-01 DIAGNOSIS — Z1389 Encounter for screening for other disorder: Secondary | ICD-10-CM

## 2015-11-01 DIAGNOSIS — Z3492 Encounter for supervision of normal pregnancy, unspecified, second trimester: Secondary | ICD-10-CM

## 2015-11-01 LAB — POCT URINALYSIS DIPSTICK
Blood, UA: NEGATIVE
Glucose, UA: NEGATIVE
Ketones, UA: NEGATIVE
Leukocytes, UA: NEGATIVE
Nitrite, UA: NEGATIVE
Protein, UA: NEGATIVE

## 2015-11-01 NOTE — Progress Notes (Signed)
Low-risk OB appointment G2P1001 6660w6d Estimated Date of Delivery: 02/29/16 BP 134/78 mmHg  Pulse 96  Wt 177 lb (80.287 kg)  LMP 05/18/2015 (Exact Date)  BP, weight, and urine reviewed.  Refer to obstetrical flow sheet for FH & FHR.  Reports good fm.  Denies regular uc's, lof, vb, or uti s/s. Constipation and upper mid back pain- discussed relief measures for both. No THC, cocaine in a long time- uds x 2 here neg for cocaine, last THC neg. Still sees [facility] q 3mths. Smoking <10cigs/day, to try to continue to decrease until completely stopped.  Reviewed ptl s/s, fm. Plan:  Continue routine obstetrical care  F/U in 4wks for OB appointment and pn2

## 2015-11-01 NOTE — Patient Instructions (Addendum)
Constipation  Drink plenty of fluid, preferably water, throughout the day  Eat foods high in fiber such as fruits, vegetables, and grains  Exercise, such as walking, is a good way to keep your bowels regular  Drink warm fluids, especially warm prune juice, or decaf coffee  Eat a 1/2 cup of real oatmeal (not instant), 1/2 cup applesauce, and 1/2-1 cup warm prune juice every day  If needed, you may take Colace (docusate sodium) stool softener once or twice a day to help keep the stool soft. If you are pregnant, wait until you are out of your first trimester (12-14 weeks of pregnancy)  If you still are having problems with constipation, you may take Miralax once daily as needed to help keep your bowels regular.  If you are pregnant, wait until you are out of your first trimester (12-14 weeks of pregnancy)   You will have your sugar test next visit.  Please do not eat or drink anything after midnight the night before you come, not even water.  You will be here for at least two hours.     Lacrosse ball, heat/ice, tylenol, good posture, for back pain  Call the office [phone]) or go to [facility] if:  You begin to have strong, frequent contractions  Your water breaks.  Sometimes it is a big gush of fluid, sometimes it is just a trickle that keeps getting your panties wet or running down your legs  You have vaginal bleeding.  It is normal to have a small amount of spotting if your cervix was checked.   You don't feel your baby moving like normal.  If you don't, get you something to eat and drink and lay down and focus on feeling your baby move.   If your baby is still not moving like normal, you should call the office or go to [facility].  Second Trimester of Pregnancy The second trimester is from week 13 through week 28, months 4 through 6. The second trimester is often a time when you feel your best. Your body has also adjusted to being pregnant, and you begin to feel better  physically. Usually, morning sickness has lessened or quit completely, you may have more energy, and you may have an increase in appetite. The second trimester is also a time when the fetus is growing rapidly. At the end of the sixth month, the fetus is about 9 inches long and weighs about 1 pounds. You will likely begin to feel the baby move (quickening) between 18 and 20 weeks of the pregnancy. BODY CHANGES Your body goes through many changes during pregnancy. The changes vary from woman to woman.   Your weight will continue to increase. You will notice your lower abdomen bulging out.  You may begin to get stretch marks on your hips, abdomen, and breasts.  You may develop headaches that can be relieved by medicines approved by your health care provider.  You may urinate more often because the fetus is pressing on your bladder.  You may develop or continue to have heartburn as a result of your pregnancy.  You may develop constipation because certain hormones are causing the muscles that push waste through your intestines to slow down.  You may develop hemorrhoids or swollen, bulging veins (varicose veins).  You may have back pain because of the weight gain and pregnancy hormones relaxing your joints between the bones in your pelvis and as a result of a shift in weight and the muscles  that support your balance.  Your breasts will continue to grow and be tender.  Your gums may bleed and may be sensitive to brushing and flossing.  Dark spots or blotches (chloasma, mask of pregnancy) may develop on your face. This will likely fade after the baby is born.  A dark line from your belly button to the pubic area (linea nigra) may appear. This will likely fade after the baby is born.  You may have changes in your hair. These can include thickening of your hair, rapid growth, and changes in texture. Some women also have hair loss during or after pregnancy, or hair that feels dry or thin. Your hair  will most likely return to normal after your baby is born. WHAT TO EXPECT AT YOUR PRENATAL VISITS During a routine prenatal visit:  You will be weighed to make sure you and the fetus are growing normally.  Your blood pressure will be taken.  Your abdomen will be measured to track your baby's growth.  The fetal heartbeat will be listened to.  Any test results from the previous visit will be discussed. Your health care provider may ask you:  How you are feeling.  If you are feeling the baby move.  If you have had any abnormal symptoms, such as leaking fluid, bleeding, severe headaches, or abdominal cramping.  If you have any questions. Other tests that may be performed during your second trimester include:  Blood tests that check for:  Low iron levels (anemia).  Gestational diabetes (between 24 and 28 weeks).  Rh antibodies.  Urine tests to check for infections, diabetes, or protein in the urine.  An ultrasound to confirm the proper growth and development of the baby.  An amniocentesis to check for possible genetic problems.  Fetal screens for spina bifida and Down syndrome. HOME CARE INSTRUCTIONS   Avoid all smoking, herbs, alcohol, and unprescribed drugs. These chemicals affect the formation and growth of the baby.  Follow your health care provider's instructions regarding medicine use. There are medicines that are either safe or unsafe to take during pregnancy.  Exercise only as directed by your health care provider. Experiencing uterine cramps is a good sign to stop exercising.  Continue to eat regular, healthy meals.  Wear a good support bra for breast tenderness.  Do not use hot tubs, steam rooms, or saunas.  Wear your seat belt at all times when driving.  Avoid raw meat, uncooked cheese, cat litter boxes, and soil used by cats. These carry germs that can cause birth defects in the baby.  Take your prenatal vitamins.  Try taking a stool softener (if your  health care provider approves) if you develop constipation. Eat more high-fiber foods, such as fresh vegetables or fruit and whole grains. Drink plenty of fluids to keep your urine clear or pale yellow.  Take warm sitz baths to soothe any pain or discomfort caused by hemorrhoids. Use hemorrhoid cream if your health care provider approves.  If you develop varicose veins, wear support hose. Elevate your feet for 15 minutes, 3-4 times a day. Limit salt in your diet.  Avoid heavy lifting, wear low heel shoes, and practice good posture.  Rest with your legs elevated if you have leg cramps or low back pain.  Visit your dentist if you have not gone yet during your pregnancy. Use a soft toothbrush to brush your teeth and be gentle when you floss.  A sexual relationship may be continued unless your health care provider directs  you otherwise.  Continue to go to all your prenatal visits as directed by your health care provider. SEEK MEDICAL CARE IF:   You have dizziness.  You have mild pelvic cramps, pelvic pressure, or nagging pain in the abdominal area.  You have persistent nausea, vomiting, or diarrhea.  You have a bad smelling vaginal discharge.  You have pain with urination. SEEK IMMEDIATE MEDICAL CARE IF:   You have a fever.  You are leaking fluid from your vagina.  You have spotting or bleeding from your vagina.  You have severe abdominal cramping or pain.  You have rapid weight gain or loss.  You have shortness of breath with chest pain.  You notice sudden or extreme swelling of your face, hands, ankles, feet, or legs.  You have not felt your baby move in over an hour.  You have severe headaches that do not go away with medicine.  You have vision changes. Document Released: 06/11/2001 Document Revised: 06/22/2013 Document Reviewed: 08/18/2012 [facility] Patient Information 2015 [place], [place]. This information is not intended to replace advice given to you by your health  care provider. Make sure you discuss any questions you have with your health care provider.

## 2015-11-11 ENCOUNTER — Emergency Department (HOSPITAL_COMMUNITY)
Admission: EM | Admit: 2015-11-11 | Discharge: 2015-11-11 | Disposition: A | Discharge status: Home / Self Care | Payer: Medicare Other | Attending: Emergency Medicine | Admitting: Emergency Medicine

## 2015-11-11 ENCOUNTER — Encounter (HOSPITAL_COMMUNITY): Payer: Self-pay

## 2015-11-11 DIAGNOSIS — W19XXXA Unspecified fall, initial encounter: Secondary | ICD-10-CM

## 2015-11-11 DIAGNOSIS — Z7982 Long term (current) use of aspirin: Secondary | ICD-10-CM | POA: Diagnosis not present

## 2015-11-11 DIAGNOSIS — K6289 Other specified diseases of anus and rectum: Secondary | ICD-10-CM | POA: Insufficient documentation

## 2015-11-11 DIAGNOSIS — O26892 Other specified pregnancy related conditions, second trimester: Secondary | ICD-10-CM | POA: Insufficient documentation

## 2015-11-11 DIAGNOSIS — F17210 Nicotine dependence, cigarettes, uncomplicated: Secondary | ICD-10-CM | POA: Diagnosis not present

## 2015-11-11 DIAGNOSIS — Z3A24 24 weeks gestation of pregnancy: Secondary | ICD-10-CM | POA: Insufficient documentation

## 2015-11-11 DIAGNOSIS — F319 Bipolar disorder, unspecified: Secondary | ICD-10-CM | POA: Insufficient documentation

## 2015-11-11 DIAGNOSIS — Z79899 Other long term (current) drug therapy: Secondary | ICD-10-CM | POA: Diagnosis not present

## 2015-11-11 DIAGNOSIS — E669 Obesity, unspecified: Secondary | ICD-10-CM | POA: Diagnosis not present

## 2015-11-11 DIAGNOSIS — O26899 Other specified pregnancy related conditions, unspecified trimester: Secondary | ICD-10-CM

## 2015-11-11 DIAGNOSIS — R109 Unspecified abdominal pain: Secondary | ICD-10-CM | POA: Diagnosis not present

## 2015-11-11 DIAGNOSIS — Y92009 Unspecified place in unspecified non-institutional (private) residence as the place of occurrence of the external cause: Secondary | ICD-10-CM

## 2015-11-11 LAB — URINE MICROSCOPIC-ADD ON

## 2015-11-11 LAB — URINALYSIS, ROUTINE W REFLEX MICROSCOPIC
Bilirubin Urine: NEGATIVE
Glucose, UA: NEGATIVE mg/dL
Hgb urine dipstick: NEGATIVE
Ketones, ur: NEGATIVE mg/dL
Nitrite: NEGATIVE
Protein, ur: NEGATIVE mg/dL
Specific Gravity, Urine: <1.005 — ABNORMAL LOW (ref 1.005–1.030)
pH: 6.5 (ref 5.0–8.0)

## 2015-11-11 MED ORDER — ACETAMINOPHEN 325 MG PO TABS
650.0000 mg | ORAL_TABLET | Freq: Once | ORAL | Status: AC
  Administered 2015-11-11: 650 mg via ORAL
  Filled 2015-11-11: qty 2

## 2015-11-11 NOTE — Progress Notes (Signed)
Pt is a G2P1 @24wk2d . Per ED note, she states she fell 2 days ago and started cramping about 1000 yesterday. ED staff has attempted continuous monitoring for over 45 minutes, but due to patient habitus are unable to maintain a continuous tracing. Patient repositioned multiple times. They hear audible fetal heart tones @ 130-140 bpm. Discussed with Dr. Despina HiddenEure and provided above information. OK to discontinue continuous fetal monitoring and obtain doppler of fetal heart tones instead.

## 2015-11-11 NOTE — ED Notes (Signed)
Pt in by ems for abd cramping.  Pt is pregnant approx 24 weeks.

## 2015-11-11 NOTE — Discharge Instructions (Signed)
You can take acetaminophen 650 mg 4 times a day if needed for pain. If you should start having vaginal bleeding or spotting or your abdominal pain gets worse, [facility]. Otherwise [facility] Monday, [facility] 15th to have them reevaluate you early this week.

## 2015-11-11 NOTE — ED Notes (Signed)
Pt alert & oriented x4, stable gait. Patient given discharge instructions, paperwork & prescription(s). Patient  instructed to stop at the registration desk to finish any additional paperwork. Patient verbalized understanding. Pt left department w/ no further questions. 

## 2015-11-11 NOTE — ED Provider Notes (Signed)
CSN: [id number]     Arrival date & time 11/11/15  0118 History   First MD Initiated Contact with Patient 11/11/15 0210 AM   Chief Complaint  Patient presents with  . Pregnant/Abdominal Pain     (Consider location/radiation/quality/duration/timing/severity/associated sxs/prior Treatment) HPI patient is G2 P1 Ab0, states she is proximally [redacted] weeks pregnant, [facility] August 31. She states she has had a normal pregnancy so far. She states 2 days ago she tripped and fell in her bedroom and fell face first onto her abdomen. She reports she's been having sharp abdominal pain off and on since then. She states that last about 8 seconds. Today she started getting pain in her rectal area that is constant. She denies any vaginal bleeding or spotting. She states she feels like the baby is moving less today. She states this feels like when she had labor pains with her 33-year-old. Patient states she is A positive.   OB [facility]  Past Medical History  Diagnosis Date  . GERD (gastroesophageal reflux disease)   . Bipolar 1 disorder (HCC)   . Depression   . Headache(784.0)   . Urinary tract infection   . Carpal tunnel syndrome on both sides     with preg  . Bipolar disorder (HCC)   . Abnormal Pap smear   . Vaginal discharge 03/11/2013  . RLQ abdominal pain 03/11/2013  . BV (bacterial vaginosis) 03/11/2013  . Ovarian cyst, right   . Obesity 06/17/2013  . Unspecified symptom associated with female genital organs 01/06/2014  . Kidney stones   . Pregnant 07/14/2015  . Nausea 07/14/2015  . Pregnant    Past Surgical History  Procedure Laterality Date  . Tonsillectomy and adenoidectomy Bilateral   . Tonsillectomy    . Myringotomy     Family History  Problem Relation Age of Onset  . Hypertension Mother   . Hypertension Father   . Hypertension Maternal Aunt   . Hypertension Maternal Uncle   . Hypertension Paternal Aunt   . Hypertension Paternal Uncle   . Diabetes Maternal Grandfather   . Diabetes  Paternal Grandmother   . Other Son     swallowing problems   Social History  Substance Use Topics  . Smoking status: Current Every Day Smoker -- 0.25 packs/day for 11 years    Types: Cigarettes    Last Attempt to Quit: 04/19/2012  . Smokeless tobacco: Never Used  . Alcohol Use: No   Smokes less than 1/2 ppd unemployed  OB History    Gravida Para Term Preterm AB TAB SAB Ectopic Multiple Living   0 0 0 0 0 0 1     Review of Systems  All other systems reviewed and are negative.     Allergies  Sulfa antibiotics; Bactrim; and Hydrocodone  Home Medications   Prior to Admission medications   Medication Sig Start Date End Date Taking? Authorizing Provider  acetaminophen (TYLENOL) 325 MG tablet Take 650 mg by mouth every 6 (six) hours as needed.   Yes Historical Provider, MD  aspirin 81 MG tablet Take 81 mg by mouth daily.   Yes Historical Provider, MD  citalopram (CELEXA) 20 MG tablet Take 20 mg by mouth daily. 05/16/15  Yes Historical Provider, MD  omeprazole (PRILOSEC) 20 MG capsule Take 20 mg by mouth daily.   Yes Historical Provider, MD  oxcarbazepine (TRILEPTAL) 600 MG tablet Take 600 mg by mouth 2 (two) times daily. 04/12/15  Yes Historical Provider, MD  prenatal vitamin  w/FE, FA (PRENATAL 1 + 1) 27-1 MG TABS tablet Take 1 tablet by mouth daily at 12 noon. 07/14/15  Yes Adline PotterJennifer A Griffin, NP  promethazine (PHENERGAN) 25 MG tablet Take 1 tablet (25 mg total) by mouth every 6 (six) hours as needed for nausea or vomiting. 09/13/15  Yes Cheral MarkerKimberly R Booker, CNM   BP 141/73 mmHg  Pulse 86  Temp(Src) 98.2 F (36.8 C) (Oral)  Resp 20  Ht 5\' 2"  (1.575 m)  Wt 175 lb (79.379 kg)  BMI 32.00 kg/m2  SpO2 99%  LMP 05/18/2015 (Exact Date)  Vital signs normal   Physical Exam  Constitutional: She is oriented to person, place, and time. She appears well-developed and well-nourished.  Non-toxic appearance. She does not appear ill. No distress.  HENT:  Head: Normocephalic and  atraumatic.  Right Ear: External ear normal.  Left Ear: External ear normal.  Nose: Nose normal. No mucosal edema or rhinorrhea.  Mouth/Throat: Oropharynx is clear and moist and mucous membranes are normal. No dental abscesses or uvula swelling.  Eyes: Conjunctivae and EOM are normal. Pupils are equal, round, and reactive to light.  Neck: Normal range of motion and full passive range of motion without pain. Neck supple.  Cardiovascular: Normal rate, regular rhythm and normal heart sounds.  Exam reveals no gallop and no friction rub.   No murmur heard. Pulmonary/Chest: Effort normal and breath sounds normal. No respiratory distress. She has no wheezes. She has no rhonchi. She has no rales. She exhibits no tenderness and no crepitus.  Abdominal: Soft. Normal appearance and bowel sounds are normal. She exhibits no distension. There is no tenderness. There is no rebound and no guarding.  Genitourinary:  Normal external genitalia, no blood in the vault, cervix is felt in the right, cervix admits a fingertip, nitrazine is negative  Musculoskeletal: Normal range of motion. She exhibits no edema or tenderness.  Moves all extremities well.   Neurological: She is alert and oriented to person, place, and time. She has normal strength. No cranial nerve deficit.  Skin: Skin is warm, dry and intact. No rash noted. No erythema. No pallor.  Psychiatric: She has a normal mood and affect. Her speech is normal and behavior is normal. Her mood appears not anxious.  Nursing note and vitals reviewed.   ED Course  Procedures (including critical care time)  Nurses are having trouble getting a steady strip on her fetal monitor. Her fetal heart rate is around 135.  2:27 AM Dr. Despina HiddenEure, states just to get a Doppler for the heart rate, give her oral fluids and Tylenol.  4 AM patient's pain is improved with Tylenol. Bedside ultrasound was done and babynoted to have good fetal heart activity. She, the baby, is not moving  very much as described by the patient.    EMERGENCY DEPARTMENT US PREGNANCY "Study: Limited Ultrasound of the Pelvis"  INDICATIONS:Pregnancy(required) and abdominal pain after fall Multiple views of the uterus and pelvic cavity are obtained with a multi-frequency probe.  APPROACH:Transabdominal   PERFORMED BY: Myself  IMAGES ARCHIVED?: Yes  LIMITATIONS: none  PREGNANCY UTERUS FINDINGS: Fetus has good heart activity, has decreased motor activity   PREGNANCY FINDINGS: Fetal heart activity seen  INTERPRETATION: Viable intrauterine pregnancy       Labs Review Results for orders placed or performed during the hospital encounter of 11/11/15  Urinalysis, Routine w reflex microscopic  Result Value Ref Range   Color, Urine YELLOW YELLOW   APPearance CLEAR CLEAR   Specific Gravity, Urine <  1.005 (L) 1.005 - 1.030   pH 6.5 5.0 - 8.0   Glucose, UA NEGATIVE NEGATIVE mg/dL   Hgb urine dipstick NEGATIVE NEGATIVE   Bilirubin Urine NEGATIVE NEGATIVE   Ketones, ur NEGATIVE NEGATIVE mg/dL   Protein, ur NEGATIVE NEGATIVE mg/dL   Nitrite NEGATIVE NEGATIVE   Leukocytes, UA TRACE (A) NEGATIVE  Urine microscopic-add on  Result Value Ref Range   Squamous Epithelial / LPF 0-5 (A) NONE SEEN   WBC, UA 0-5 0 - 5 WBC/hpf   RBC / HPF 0-5 0 - 5 RBC/hpf   Bacteria, UA RARE (A) NONE SEEN   Laboratory interpretation all normal      MDM  Patient presents after falling flat on her abdomen, patient is [redacted] weeks pregnant. She's been having pain since she fell 2 days ago. She is advised she can take Tylenol as needed for pain. She is noted to have good fetal heart activity although she does have decreased fetal movement by ultrasound. She was discussed with Dr. Despina Hidden, OB on call, who feels she can be discharged home. She was advised to go to [facility] hospital or she gets worsening pain or vaginal bleeding or spotting. Otherwise she can be followed up early [facility].    Final diagnoses:  Abdominal pain affecting pregnancy  Fall at home, initial encounter    Plan discharge  Devoria Albe, MD, Concha Pyo, MD 11/11/15 [phone]

## 2015-11-11 NOTE — ED Notes (Signed)
[facility]

## 2015-11-11 NOTE — ED Notes (Signed)
Pt ambulated to restroom & returned to room w/ no complications. 

## 2015-11-11 NOTE — ED Notes (Signed)
Pt states she fell 2 days ago landing on her abdomen. Cramping started around 1000 yesterday. Also complaining of pain to the right buttocks running down her leg.

## 2015-11-29 ENCOUNTER — Ambulatory Visit (INDEPENDENT_AMBULATORY_CARE_PROVIDER_SITE_OTHER): Payer: Medicare Other | Admitting: Women's Health

## 2015-11-29 ENCOUNTER — Other Ambulatory Visit: Payer: Medicare Other

## 2015-11-29 ENCOUNTER — Encounter: Payer: Self-pay | Admitting: Women's Health

## 2015-11-29 VITALS — BP 138/78 | HR 92 | Wt 179.0 lb

## 2015-11-29 DIAGNOSIS — Z331 Pregnant state, incidental: Secondary | ICD-10-CM

## 2015-11-29 DIAGNOSIS — O99342 Other mental disorders complicating pregnancy, second trimester: Secondary | ICD-10-CM

## 2015-11-29 DIAGNOSIS — Z3482 Encounter for supervision of other normal pregnancy, second trimester: Secondary | ICD-10-CM

## 2015-11-29 DIAGNOSIS — O26899 Other specified pregnancy related conditions, unspecified trimester: Secondary | ICD-10-CM

## 2015-11-29 DIAGNOSIS — O26892 Other specified pregnancy related conditions, second trimester: Secondary | ICD-10-CM | POA: Diagnosis not present

## 2015-11-29 DIAGNOSIS — R109 Unspecified abdominal pain: Secondary | ICD-10-CM

## 2015-11-29 DIAGNOSIS — Z3A27 27 weeks gestation of pregnancy: Secondary | ICD-10-CM

## 2015-11-29 DIAGNOSIS — Z131 Encounter for screening for diabetes mellitus: Secondary | ICD-10-CM

## 2015-11-29 DIAGNOSIS — Z1389 Encounter for screening for other disorder: Secondary | ICD-10-CM

## 2015-11-29 DIAGNOSIS — Z369 Encounter for antenatal screening, unspecified: Secondary | ICD-10-CM

## 2015-11-29 LAB — POCT URINALYSIS DIPSTICK
Blood, UA: NEGATIVE
Glucose, UA: NEGATIVE
Ketones, UA: NEGATIVE
Nitrite, UA: NEGATIVE
Protein, UA: NEGATIVE

## 2015-11-29 LAB — POCT WET PREP (WET MOUNT): Clue Cells Wet Prep Whiff POC: NEGATIVE

## 2015-11-29 NOTE — Patient Instructions (Signed)

## 2015-11-29 NOTE — Progress Notes (Signed)
Low-risk OB appointment G2P1001 2550w6d Estimated Date of Delivery: 02/29/16 BP 138/78 mmHg  Pulse 92  Wt 179 lb (81.194 kg)  LMP 05/18/2015 (Exact Date)  BP, weight, and urine reviewed.  Refer to obstetrical flow sheet for FH & FHR.  Reports good fm.  Denies regular uc's, lof, vb, or uti s/s. Lots of cramping. Denies abnormal d/c. No sex since Dec, had neg gc/ct in Jan.  Spec exam: cx visually closed, mod amt creamy white nonodorous d/c, wet prep neg SVE: LTC Reviewed ptl s/s, fkc. Recommended Tdap at HD/PCP per CDC guidelines.  Plan:  Continue routine obstetrical care  F/U in 4wks for OB appointment  PN2 today

## 2015-11-30 LAB — GLUCOSE TOLERANCE, 2 HOURS W/ 1HR
Glucose, 1 hour: 137 mg/dL (ref 65–179)
Glucose, 2 hour: 135 mg/dL (ref 65–152)
Glucose, Fasting: 84 mg/dL (ref 65–91)

## 2015-11-30 LAB — CBC WITH DIFFERENTIAL/PLATELET
Basophils Absolute: 0 10*3/uL (ref 0.0–0.2)
Basos: 0 %
EOS (ABSOLUTE): 0.2 10*3/uL (ref 0.0–0.4)
Eos: 1 %
Hematocrit: 34.8 % (ref 34.0–46.6)
Hemoglobin: 11.6 g/dL (ref 11.1–15.9)
Immature Grans (Abs): 0 10*3/uL (ref 0.0–0.1)
Immature Granulocytes: 0 %
Lymphocytes Absolute: 2.2 10*3/uL (ref 0.7–3.1)
Lymphs: 20 %
MCH: 31.7 pg (ref 26.6–33.0)
MCHC: 33.3 g/dL (ref 31.5–35.7)
MCV: 95 fL (ref 79–97)
Monocytes Absolute: 0.6 10*3/uL (ref 0.1–0.9)
Monocytes: 5 %
Neutrophils Absolute: 7.8 10*3/uL — ABNORMAL HIGH (ref 1.4–7.0)
Neutrophils: 74 %
Platelets: 250 10*3/uL (ref 150–379)
RBC: 3.66 x10E6/uL — ABNORMAL LOW (ref 3.77–5.28)
RDW: 13.5 % (ref 12.3–15.4)
WBC: 10.7 10*3/uL (ref 3.4–10.8)

## 2015-11-30 LAB — ANTIBODY SCREEN: Antibody Screen: NEGATIVE

## 2015-11-30 LAB — HIV ANTIBODY (ROUTINE TESTING W REFLEX): HIV Screen 4th Generation wRfx: NONREACTIVE

## 2015-11-30 LAB — RPR: RPR Ser Ql: NONREACTIVE

## 2015-12-01 ENCOUNTER — Telehealth: Payer: Self-pay | Admitting: Women's Health

## 2015-12-01 NOTE — Telephone Encounter (Signed)
Pt informed of WNL Glucoses Tolerance test, RPR, HIV, Antibody screen, CBC, from 11/29/2015.

## 2015-12-01 NOTE — Telephone Encounter (Signed)
Pt called stating that she has had some labs done and would like to know the results. Please contact pt

## 2015-12-27 ENCOUNTER — Encounter: Payer: Self-pay | Admitting: Women's Health

## 2015-12-27 ENCOUNTER — Ambulatory Visit (INDEPENDENT_AMBULATORY_CARE_PROVIDER_SITE_OTHER): Payer: Medicare Other | Admitting: Women's Health

## 2015-12-27 VITALS — BP 142/64 | HR 92 | Wt 181.0 lb

## 2015-12-27 DIAGNOSIS — R030 Elevated blood-pressure reading, without diagnosis of hypertension: Secondary | ICD-10-CM

## 2015-12-27 DIAGNOSIS — F1491 Cocaine use, unspecified, in remission: Secondary | ICD-10-CM

## 2015-12-27 DIAGNOSIS — Z87898 Personal history of other specified conditions: Secondary | ICD-10-CM

## 2015-12-27 DIAGNOSIS — Z1389 Encounter for screening for other disorder: Secondary | ICD-10-CM

## 2015-12-27 DIAGNOSIS — Z331 Pregnant state, incidental: Secondary | ICD-10-CM

## 2015-12-27 DIAGNOSIS — F1410 Cocaine abuse, uncomplicated: Secondary | ICD-10-CM

## 2015-12-27 DIAGNOSIS — Z3493 Encounter for supervision of normal pregnancy, unspecified, third trimester: Secondary | ICD-10-CM

## 2015-12-27 DIAGNOSIS — IMO0001 Reserved for inherently not codable concepts without codable children: Secondary | ICD-10-CM

## 2015-12-27 LAB — POCT URINALYSIS DIPSTICK
Blood, UA: NEGATIVE
Glucose, UA: NEGATIVE
Ketones, UA: NEGATIVE
Leukocytes, UA: NEGATIVE
Nitrite, UA: NEGATIVE

## 2015-12-27 NOTE — Progress Notes (Signed)
Low-risk OB appointment G2P1001 7432w6d Estimated Date of Delivery: 02/29/16 BP 142/64 mmHg  Pulse 92  Wt 181 lb (82.101 kg)  LMP 05/18/2015 (Exact Date)  BP, weight, and urine reviewed.  Refer to obstetrical flow sheet for FH & FHR.  Reports good fm.  Denies regular uc's, lof, vb, or uti s/s. No complaints. Denies ha, visual changes, ruq/epigastric pain, n/v.  Had 1 elevated bp @ 11wks, has been high normal since. Denies h/o HTN outside of pregnancy. H/O pre-e, taking baby asa as directed.  No swelling, DTRs 1-2+, no clonus Reviewed normal pn2 results, ptl s/s, pre-e s/s, fkc. Plan:  Continue routine obstetrical care  F/U in 2d for OB appointment/bp check Pre-e labs today

## 2015-12-27 NOTE — Patient Instructions (Signed)
Call the office [phone]) or go to [facility] if:  You begin to have strong, frequent contractions  Your water breaks.  Sometimes it is a big gush of fluid, sometimes it is just a trickle that keeps getting your panties wet or running down your legs  You have vaginal bleeding.  It is normal to have a small amount of spotting if your cervix was checked.   You don't feel your baby moving like normal.  If you don't, get you something to eat and drink and lay down and focus on feeling your baby move.  You should feel at least 10 movements in 2 hours.  If you don't, you should call the office or go to [facility].    Call the office [phone]) or [facility] for these signs of pre-eclampsia:  Severe headache that does not go away with Tylenol  Visual changes- seeing spots, double, blurred vision  Pain under your right breast or upper abdomen that does not go away with Tums or heartburn medicine  Nausea and/or vomiting  Severe swelling in your hands, feet, and face     Preterm Labor Information Preterm labor is when labor starts at less than 37 weeks of pregnancy. The normal length of a pregnancy is 39 to 41 weeks. CAUSES Often, there is no identifiable underlying cause as to why a woman goes into preterm labor. One of the most common known causes of preterm labor is infection. Infections of the uterus, cervix, vagina, amniotic sac, bladder, kidney, or even the lungs (pneumonia) can cause labor to start. Other suspected causes of preterm labor include:   Urogenital infections, such as yeast infections and bacterial vaginosis.   Uterine abnormalities (uterine shape, uterine septum, fibroids, or bleeding from the placenta).   A cervix that has been operated on (it may fail to stay closed).   Malformations in the fetus.   Multiple gestations (twins, triplets, and so on).   Breakage of the amniotic sac.  RISK FACTORS  Having a previous history of preterm  labor.   Having premature rupture of membranes (PROM).   Having a placenta that covers the opening of the cervix (placenta previa).   Having a placenta that separates from the uterus (placental abruption).   Having a cervix that is too weak to hold the fetus in the uterus (incompetent cervix).   Having too much fluid in the amniotic sac (polyhydramnios).   Taking illegal drugs or smoking while pregnant.   Not gaining enough weight while pregnant.   Being younger than 8818 and older than 27 years old.   Having a low socioeconomic status.   Being African American. SYMPTOMS Signs and symptoms of preterm labor include:   Menstrual-like cramps, abdominal pain, or back pain.  Uterine contractions that are regular, as frequent as six in an hour, regardless of their intensity (may be mild or painful).  Contractions that start on the top of the uterus and spread down to the lower abdomen and back.   A sense of increased pelvic pressure.   A watery or bloody mucus discharge that comes from the vagina.  TREATMENT Depending on the length of the pregnancy and other circumstances, your health care provider may suggest bed rest. If necessary, there are medicines that can be given to stop contractions and to mature the fetal lungs. If labor happens before 34 weeks of pregnancy, a prolonged hospital stay may be recommended. Treatment depends on the condition of both you and the fetus.  WHAT SHOULD YOU DO IF YOU THINK YOU ARE IN PRETERM LABOR? Call your health care provider right away. You will need to go to the hospital to get checked immediately. HOW CAN YOU PREVENT PRETERM LABOR IN FUTURE PREGNANCIES? You should:   Stop smoking if you smoke.  Maintain healthy weight gain and avoid chemicals and drugs that are not necessary.  Be watchful for any type of infection.  Inform your health care provider if you have a known history of preterm labor.   This information is not  intended to replace advice given to you by your health care provider. Make sure you discuss any questions you have with your health care provider.   Document Released: 09/07/2003 Document Revised: 02/17/2013 Document Reviewed: 07/20/2012 Elsevier Interactive Patient Education 2016 [employer]  Hypertension During Pregnancy Hypertension, or high blood pressure, is when there is extra pressure inside your blood vessels that carry blood from the heart to the rest of your body (arteries). It can happen at any time in life, including pregnancy. Hypertension during pregnancy can cause problems for you and your baby. Your baby might not weigh as much as he or she should at birth or might be born early (premature). Very bad cases of hypertension during pregnancy can be life-threatening.  Different types of hypertension can occur during pregnancy. These include:  Chronic hypertension. This happens when a woman has hypertension before pregnancy and it continues during pregnancy.  Gestational hypertension. This is when hypertension develops during pregnancy.  Preeclampsia or toxemia of pregnancy. This is a very serious type of hypertension that develops only during pregnancy. It affects the whole body and can be very dangerous for both mother and baby.  Gestational hypertension and preeclampsia usually go away after your baby is born. Your blood pressure will likely stabilize within 6 weeks. Women who have hypertension during pregnancy have a greater chance of developing hypertension later in life or with future pregnancies. RISK FACTORS There are certain factors that make it more likely for you to develop hypertension during pregnancy. These include:  Having hypertension before pregnancy.  Having hypertension during a previous pregnancy.  Being overweight.  Being older than 40 years.  Being pregnant with more than one baby.  Having diabetes or kidney problems. SIGNS AND SYMPTOMS Chronic and  gestational hypertension rarely cause symptoms. Preeclampsia has symptoms, which may include:  Increased protein in your urine. Your health care provider will check for this at every prenatal visit.  Swelling of your hands and face.  Rapid weight gain.  Headaches.  Visual changes.  Being bothered by light.  Abdominal pain, especially in the upper right area.  Chest pain.  Shortness of breath.  Increased reflexes.  Seizures. These occur with a more severe form of preeclampsia, called eclampsia. DIAGNOSIS  You may be diagnosed with hypertension during a regular prenatal exam. At each prenatal visit, you may have:  Your blood pressure checked.  A urine test to check for protein in your urine. The type of hypertension you are diagnosed with depends on when you developed it. It also depends on your specific blood pressure reading.  Developing hypertension before 20 weeks of pregnancy is consistent with chronic hypertension.  Developing hypertension after 20 weeks of pregnancy is consistent with gestational hypertension.  Hypertension with increased urinary protein is diagnosed as preeclampsia.  Blood pressure measurements that stay above 160 systolic or 110 diastolic are a sign of severe preeclampsia. TREATMENT Treatment for hypertension during pregnancy varies. Treatment depends on the  type of hypertension and how serious it is.  If you take medicine for chronic hypertension, you may need to switch medicines.  Medicines called ACE inhibitors should not be taken during pregnancy.  Low-dose aspirin may be suggested for women who have risk factors for preeclampsia.  If you have gestational hypertension, you may need to take a blood pressure medicine that is safe during pregnancy. Your health care provider will recommend the correct medicine.  If you have severe preeclampsia, you may need to be in the hospital. Health care providers will watch you and your baby very closely.  You also may need to take medicine called magnesium sulfate to prevent seizures and lower blood pressure.  Sometimes, an early delivery is needed. This may be the case if the condition worsens. It would be done to protect you and your baby. The only cure for preeclampsia is delivery.  Your health care provider may recommend that you take one low-dose aspirin (81 mg) each day to help prevent high blood pressure during your pregnancy if you are at risk for preeclampsia. You may be at risk for preeclampsia if:  You had preeclampsia or eclampsia during a previous pregnancy.  Your baby did not grow as expected during a previous pregnancy.  You experienced preterm birth with a previous pregnancy.  You experienced a separation of the placenta from the uterus (placental abruption) during a previous pregnancy.  You experienced the loss of your baby during a previous pregnancy.  You are pregnant with more than one baby.  You have other medical conditions, such as diabetes or an autoimmune disease. HOME CARE INSTRUCTIONS  Schedule and keep all of your regular prenatal care appointments. This is important.  Take medicines only as directed by your health care provider. Tell your health care provider about all medicines you take.  Eat as little salt as possible.  Get regular exercise.  Do not drink alcohol.  Do not use tobacco products.  Do not drink products with caffeine.  Lie on your left side when resting. SEEK IMMEDIATE MEDICAL CARE IF:  You have severe abdominal pain.  You have sudden swelling in your hands, ankles, or face.  You gain 4 pounds (1.8 kg) or more in 1 week.  You vomit repeatedly.  You have vaginal bleeding.  You do not feel your baby moving as much.  You have a headache.  You have blurred or double vision.  You have muscle twitching or spasms.  You have shortness of breath.  You have blue fingernails or lips.  You have blood in your urine. MAKE SURE  YOU:  Understand these instructions.  Will watch your condition.  Will get help right away if you are not doing well or get worse.   This information is not intended to replace advice given to you by your health care provider. Make sure you discuss any questions you have with your health care provider.   Document Released: 03/05/2011 Document Revised: 07/08/2014 Document Reviewed: 01/14/2013 Elsevier Interactive Patient Education [employer] Elsevier Inc.

## 2015-12-28 LAB — COMPREHENSIVE METABOLIC PANEL
ALT: 10 IU/L (ref 0–32)
AST: 17 IU/L (ref 0–40)
Albumin/Globulin Ratio: 1.6 (ref 1.2–2.2)
Albumin: 3.6 g/dL (ref 3.5–5.5)
Alkaline Phosphatase: 66 IU/L (ref 39–117)
BUN/Creatinine Ratio: 6 — ABNORMAL LOW (ref 9–23)
BUN: 3 mg/dL — ABNORMAL LOW (ref 6–20)
Bilirubin Total: <0.2 mg/dL (ref 0.0–1.2)
CO2: 19 mmol/L (ref 18–29)
Calcium: 8.9 mg/dL (ref 8.7–10.2)
Chloride: 103 mmol/L (ref 96–106)
Creatinine, Ser: 0.47 mg/dL — ABNORMAL LOW (ref 0.57–1.00)
GFR calc Af Amer: 158 mL/min/{1.73_m2} (ref >59)
GFR calc non Af Amer: 137 mL/min/{1.73_m2} (ref >59)
Globulin, Total: 2.3 g/dL (ref 1.5–4.5)
Glucose: 76 mg/dL (ref 65–99)
Potassium: 3.3 mmol/L — ABNORMAL LOW (ref 3.5–5.2)
Sodium: 141 mmol/L (ref 134–144)
Total Protein: 5.9 g/dL — ABNORMAL LOW (ref 6.0–8.5)

## 2015-12-28 LAB — CBC
Hematocrit: 33.4 % — ABNORMAL LOW (ref 34.0–46.6)
Hemoglobin: 11.2 g/dL (ref 11.1–15.9)
MCH: 31.4 pg (ref 26.6–33.0)
MCHC: 33.5 g/dL (ref 31.5–35.7)
MCV: 94 fL (ref 79–97)
Platelets: 251 10*3/uL (ref 150–379)
RBC: 3.57 x10E6/uL — ABNORMAL LOW (ref 3.77–5.28)
RDW: 13.9 % (ref 12.3–15.4)
WBC: 10 10*3/uL (ref 3.4–10.8)

## 2015-12-28 LAB — PROTEIN / CREATININE RATIO, URINE
Creatinine, Urine: 113 mg/dL
Protein, Ur: 28.9 mg/dL
Protein/Creat Ratio: 256 mg/g creat — ABNORMAL HIGH (ref 0–200)

## 2015-12-29 ENCOUNTER — Encounter: Payer: Self-pay | Admitting: Obstetrics and Gynecology

## 2015-12-29 ENCOUNTER — Ambulatory Visit (INDEPENDENT_AMBULATORY_CARE_PROVIDER_SITE_OTHER): Payer: Medicare Other | Admitting: Obstetrics and Gynecology

## 2015-12-29 VITALS — BP 122/80 | HR 96 | Wt 179.0 lb

## 2015-12-29 DIAGNOSIS — Z331 Pregnant state, incidental: Secondary | ICD-10-CM

## 2015-12-29 DIAGNOSIS — Z1389 Encounter for screening for other disorder: Secondary | ICD-10-CM

## 2015-12-29 DIAGNOSIS — Z3493 Encounter for supervision of normal pregnancy, unspecified, third trimester: Secondary | ICD-10-CM

## 2015-12-29 LAB — POCT URINALYSIS DIPSTICK
Blood, UA: NEGATIVE
Glucose, UA: NEGATIVE
Ketones, UA: NEGATIVE
Leukocytes, UA: NEGATIVE
Nitrite, UA: NEGATIVE

## 2015-12-29 NOTE — Progress Notes (Signed)
Follow up today for BP check .

## 2015-12-29 NOTE — Progress Notes (Signed)
G1P0000 587w3d Estimated Date of Delivery: 01/16/16  G2P1001 9242w1d Estimated Date of Delivery: 02/29/16 LROB  Patient reports   good fetal movement, denies any bleeding and no rupture of membranes symptoms or regular contractions. Patient complaints:none, denies h/a.  Blood pressure 122/80, pulse 96, weight 179 lb (81.194 kg), last menstrual period 05/18/2015. refer to the ob flow sheet for FH and FHR, also BP, Wt, Urine results:notable for trace of protein, otherwise negative                          Physical Examination:  General appearance - alert, well appearing, and in no distress Abdomen - FH  ,                   -FHR  soft, nontender, nondistended, no masses or organomegaly  Pelvic - normal external genitalia, vulva, vagina, cervix, uterus and adnexa                                            Questions were answered. Assessment: LROB G2P1001 @ 9142w1d , normal pressures on recheck  Plan:  Continued routine obstetrical care,   F/u in 1 weeks for LROB visit  By signing my name below, I, Soijett Blue, attest that this documentation has been prepared under the direction and in the presence of Tilda BurrowJohn V Ferguson, MD. Electronically Signed: Soijett Blue, ED Scribe. 12/29/2015. 12:44 PM.  I personally performed the services described in this documentation, which was SCRIBED in my presence. The recorded information has been reviewed and considered accurate. It has been edited as necessary during review. Tilda BurrowFERGUSON,JOHN V, MD

## 2016-01-04 ENCOUNTER — Encounter: Payer: Self-pay | Admitting: Women's Health

## 2016-01-04 ENCOUNTER — Ambulatory Visit (INDEPENDENT_AMBULATORY_CARE_PROVIDER_SITE_OTHER): Payer: Medicare Other | Admitting: Women's Health

## 2016-01-04 VITALS — BP 130/74 | HR 95 | Wt 181.0 lb

## 2016-01-04 DIAGNOSIS — Z3483 Encounter for supervision of other normal pregnancy, third trimester: Secondary | ICD-10-CM

## 2016-01-04 DIAGNOSIS — Z3A32 32 weeks gestation of pregnancy: Secondary | ICD-10-CM

## 2016-01-04 DIAGNOSIS — Z3493 Encounter for supervision of normal pregnancy, unspecified, third trimester: Secondary | ICD-10-CM

## 2016-01-04 DIAGNOSIS — Z331 Pregnant state, incidental: Secondary | ICD-10-CM

## 2016-01-04 DIAGNOSIS — Z1389 Encounter for screening for other disorder: Secondary | ICD-10-CM

## 2016-01-04 LAB — POCT URINALYSIS DIPSTICK
Blood, UA: NEGATIVE
Glucose, UA: NEGATIVE
Ketones, UA: NEGATIVE
Leukocytes, UA: NEGATIVE
Nitrite, UA: NEGATIVE
Protein, UA: NEGATIVE

## 2016-01-04 NOTE — Progress Notes (Signed)
Low-risk OB appointment G2P1001 9977w0d Estimated Date of Delivery: 02/29/16 BP 130/74 mmHg  Pulse 95  Wt 181 lb (82.101 kg)  LMP 05/18/2015 (Exact Date)  BP, weight, and urine reviewed.  Refer to obstetrical flow sheet for FH & FHR.  Reports good fm.  Denies regular uc's, lof, vb, or uti s/s. No complaints. Wants BTL, discussed high incidence of regret <27yo, still wants done, discussed risks/benefits, in-hospital btl vs interval salpingectomy for reduction of ovarian ca risk- pt prefers interval salpingectomy- consent signed today.  BP good x last 2 visits, pre-e labs were normal 6/28. Denies ha, visual changes, ruq/epigastric pain, n/v.   Reviewed ptl s/s, fkc. Plan:  Continue routine obstetrical care  F/U in 2wks for OB appointment

## 2016-01-04 NOTE — Progress Notes (Signed)
Pt denies any problems or concerns at this time.  

## 2016-01-04 NOTE — Patient Instructions (Signed)
Call the office ([phone]) or [facility] if:  You begin to have strong, frequent contractions  Your water breaks.  Sometimes it is a big gush of fluid, sometimes it is just a trickle that keeps getting your panties wet or running down your legs  You have vaginal bleeding.  It is normal to have a small amount of spotting if your cervix was checked.   You don't feel your baby moving like normal.  If you don't, get you something to eat and drink and lay down and focus on feeling your baby move.  You should feel at least 10 movements in 2 hours.  If you don't, you should call the office or go to [facility].    Preterm Labor Information Preterm labor is when labor starts at less than 37 weeks of pregnancy. The normal length of a pregnancy is 39 to 41 weeks. CAUSES Often, there is no identifiable underlying cause as to why a woman goes into preterm labor. One of the most common known causes of preterm labor is infection. Infections of the uterus, cervix, vagina, amniotic sac, bladder, kidney, or even the lungs (pneumonia) can cause labor to start. Other suspected causes of preterm labor include:   Urogenital infections, such as yeast infections and bacterial vaginosis.   Uterine abnormalities (uterine shape, uterine septum, fibroids, or bleeding from the placenta).   A cervix that has been operated on (it may fail to stay closed).   Malformations in the fetus.   Multiple gestations (twins, triplets, and so on).   Breakage of the amniotic sac.  RISK FACTORS  Having a previous history of preterm labor.   Having premature rupture of membranes (PROM).   Having a placenta that covers the opening of the cervix (placenta previa).   Having a placenta that separates from the uterus (placental abruption).   Having a cervix that is too weak to hold the fetus in the uterus (incompetent cervix).   Having too much fluid in the amniotic sac (polyhydramnios).   Taking  illegal drugs or smoking while pregnant.   Not gaining enough weight while pregnant.   Being younger than 18 and older than 27 years old.   Having a low socioeconomic status.   Being African American. SYMPTOMS Signs and symptoms of preterm labor include:   Menstrual-like cramps, abdominal pain, or back pain.  Uterine contractions that are regular, as frequent as six in an hour, regardless of their intensity (may be mild or painful).  Contractions that start on the top of the uterus and spread down to the lower abdomen and back.   A sense of increased pelvic pressure.   A watery or bloody mucus discharge that comes from the vagina.  TREATMENT Depending on the length of the pregnancy and other circumstances, your health care provider may suggest bed rest. If necessary, there are medicines that can be given to stop contractions and to mature the fetal lungs. If labor happens before 34 weeks of pregnancy, a prolonged hospital stay may be recommended. Treatment depends on the condition of both you and the fetus.  WHAT SHOULD YOU DO IF YOU THINK YOU ARE IN PRETERM LABOR? Call your health care provider right away. You will need to go to the hospital to get checked immediately. HOW CAN YOU PREVENT PRETERM LABOR IN FUTURE PREGNANCIES? You should:   Stop smoking if you smoke.  Maintain healthy weight gain and avoid chemicals and drugs that are not necessary.  Be watchful for   any type of infection.  Inform your health care provider if you have a known history of preterm labor.   This information is not intended to replace advice given to you by your health care provider. Make sure you discuss any questions you have with your health care provider.   Document Released: 09/07/2003 Document Revised: 02/17/2013 Document Reviewed: 07/20/2012 Elsevier Interactive Patient Education 2016 Elsevier Inc.  

## 2016-01-18 ENCOUNTER — Ambulatory Visit (INDEPENDENT_AMBULATORY_CARE_PROVIDER_SITE_OTHER): Payer: Medicare Other | Admitting: Advanced Practice Midwife

## 2016-01-18 ENCOUNTER — Encounter: Payer: Self-pay | Admitting: Advanced Practice Midwife

## 2016-01-18 VITALS — BP 142/84 | HR 90 | Wt 183.0 lb

## 2016-01-18 DIAGNOSIS — O368130 Decreased fetal movements, third trimester, not applicable or unspecified: Secondary | ICD-10-CM | POA: Diagnosis not present

## 2016-01-18 DIAGNOSIS — Z3493 Encounter for supervision of normal pregnancy, unspecified, third trimester: Secondary | ICD-10-CM

## 2016-01-18 DIAGNOSIS — Z3A34 34 weeks gestation of pregnancy: Secondary | ICD-10-CM

## 2016-01-18 DIAGNOSIS — Z3483 Encounter for supervision of other normal pregnancy, third trimester: Secondary | ICD-10-CM

## 2016-01-18 DIAGNOSIS — Z331 Pregnant state, incidental: Secondary | ICD-10-CM

## 2016-01-18 DIAGNOSIS — Z1389 Encounter for screening for other disorder: Secondary | ICD-10-CM

## 2016-01-18 DIAGNOSIS — O09293 Supervision of pregnancy with other poor reproductive or obstetric history, third trimester: Secondary | ICD-10-CM

## 2016-01-18 LAB — POCT URINALYSIS DIPSTICK
Blood, UA: NEGATIVE
Glucose, UA: NEGATIVE
Ketones, UA: NEGATIVE
Nitrite, UA: NEGATIVE
Protein, UA: NEGATIVE

## 2016-01-18 NOTE — Progress Notes (Signed)
G2P1001 4057w0d Estimated Date of Delivery: 02/29/16  Blood pressure 142/84, pulse 90, weight 183 lb (83.008 kg), last menstrual period 05/18/2015.   BP weight and urine results all reviewed and noted.  Please refer to the obstetrical flow sheet for the fundal height and fetal heart rate documentation:  Patient reports decreased FM for 2 weeks. NST reactive, not feeling all the movements that are audilble on EFM. Having nightmares, difficulty sleeping.   denies any bleeding and no rupture of membranes symptoms or regular contractions. Patient is without complaints. All questions were answered.  Orders Placed This Encounter  Procedures  . POCT Urinalysis Dipstick  . Fetal nonstress test    Plan:  Continued routine obstetrical care, Unisom of Benadryl.   Return in about 4 days (around 01/22/2016) for BP check w/RN; 7/26 for LROB. (No provider appts on Monday)

## 2016-01-18 NOTE — Patient Instructions (Signed)

## 2016-01-20 ENCOUNTER — Inpatient Hospital Stay (HOSPITAL_COMMUNITY): Payer: Medicare Other

## 2016-01-20 ENCOUNTER — Encounter (HOSPITAL_COMMUNITY): Payer: Self-pay

## 2016-01-20 ENCOUNTER — Inpatient Hospital Stay (HOSPITAL_COMMUNITY)
Admission: AD | Admit: 2016-01-20 | Discharge: 2016-01-27 | DRG: 781 | Disposition: A | Discharge status: Home / Self Care | Payer: Medicare Other | Source: Ambulatory Visit | Attending: Obstetrics and Gynecology | Admitting: Obstetrics and Gynecology

## 2016-01-20 DIAGNOSIS — O99343 Other mental disorders complicating pregnancy, third trimester: Secondary | ICD-10-CM | POA: Diagnosis present

## 2016-01-20 DIAGNOSIS — O99323 Drug use complicating pregnancy, third trimester: Secondary | ICD-10-CM | POA: Diagnosis present

## 2016-01-20 DIAGNOSIS — O99333 Smoking (tobacco) complicating pregnancy, third trimester: Secondary | ICD-10-CM | POA: Diagnosis present

## 2016-01-20 DIAGNOSIS — O133 Gestational [pregnancy-induced] hypertension without significant proteinuria, third trimester: Secondary | ICD-10-CM | POA: Diagnosis present

## 2016-01-20 DIAGNOSIS — F319 Bipolar disorder, unspecified: Secondary | ICD-10-CM | POA: Diagnosis present

## 2016-01-20 DIAGNOSIS — Z833 Family history of diabetes mellitus: Secondary | ICD-10-CM | POA: Diagnosis not present

## 2016-01-20 DIAGNOSIS — K5900 Constipation, unspecified: Secondary | ICD-10-CM | POA: Diagnosis present

## 2016-01-20 DIAGNOSIS — Z3A34 34 weeks gestation of pregnancy: Secondary | ICD-10-CM | POA: Diagnosis not present

## 2016-01-20 DIAGNOSIS — K219 Gastro-esophageal reflux disease without esophagitis: Secondary | ICD-10-CM | POA: Diagnosis present

## 2016-01-20 DIAGNOSIS — O139 Gestational [pregnancy-induced] hypertension without significant proteinuria, unspecified trimester: Secondary | ICD-10-CM

## 2016-01-20 DIAGNOSIS — Z87442 Personal history of urinary calculi: Secondary | ICD-10-CM | POA: Diagnosis not present

## 2016-01-20 DIAGNOSIS — O4593 Premature separation of placenta, unspecified, third trimester: Principal | ICD-10-CM | POA: Diagnosis present

## 2016-01-20 DIAGNOSIS — Z3A35 35 weeks gestation of pregnancy: Secondary | ICD-10-CM | POA: Diagnosis not present

## 2016-01-20 DIAGNOSIS — O4693 Antepartum hemorrhage, unspecified, third trimester: Secondary | ICD-10-CM

## 2016-01-20 DIAGNOSIS — Z8249 Family history of ischemic heart disease and other diseases of the circulatory system: Secondary | ICD-10-CM

## 2016-01-20 DIAGNOSIS — Z302 Encounter for sterilization: Secondary | ICD-10-CM | POA: Diagnosis not present

## 2016-01-20 DIAGNOSIS — F17210 Nicotine dependence, cigarettes, uncomplicated: Secondary | ICD-10-CM | POA: Diagnosis present

## 2016-01-20 DIAGNOSIS — F1290 Cannabis use, unspecified, uncomplicated: Secondary | ICD-10-CM | POA: Diagnosis present

## 2016-01-20 DIAGNOSIS — O219 Vomiting of pregnancy, unspecified: Secondary | ICD-10-CM | POA: Diagnosis not present

## 2016-01-20 DIAGNOSIS — O99613 Diseases of the digestive system complicating pregnancy, third trimester: Secondary | ICD-10-CM | POA: Diagnosis present

## 2016-01-20 HISTORY — DX: Gestational (pregnancy-induced) hypertension without significant proteinuria, unspecified trimester: O13.9

## 2016-01-20 HISTORY — DX: Essential (primary) hypertension: I10

## 2016-01-20 LAB — CBC
HCT: 30.6 % — ABNORMAL LOW (ref 36.0–46.0)
Hemoglobin: 10.5 g/dL — ABNORMAL LOW (ref 12.0–15.0)
MCH: 31.2 pg (ref 26.0–34.0)
MCHC: 34.3 g/dL (ref 30.0–36.0)
MCV: 90.8 fL (ref 78.0–100.0)
Platelets: 232 10*3/uL (ref 150–400)
RBC: 3.37 MIL/uL — ABNORMAL LOW (ref 3.87–5.11)
RDW: 14.2 % (ref 11.5–15.5)
WBC: 11.2 10*3/uL — ABNORMAL HIGH (ref 4.0–10.5)

## 2016-01-20 LAB — RAPID URINE DRUG SCREEN, HOSP PERFORMED
Amphetamines: NOT DETECTED
Barbiturates: NOT DETECTED
Benzodiazepines: NOT DETECTED
Cocaine: NOT DETECTED
Opiates: NOT DETECTED
Tetrahydrocannabinol: NOT DETECTED

## 2016-01-20 LAB — URINALYSIS, ROUTINE W REFLEX MICROSCOPIC
Bilirubin Urine: NEGATIVE
Glucose, UA: NEGATIVE mg/dL
Ketones, ur: NEGATIVE mg/dL
Nitrite: NEGATIVE
Protein, ur: NEGATIVE mg/dL
Specific Gravity, Urine: <1.005 — ABNORMAL LOW (ref 1.005–1.030)
pH: 6 (ref 5.0–8.0)

## 2016-01-20 LAB — COMPREHENSIVE METABOLIC PANEL
ALT: 11 U/L — ABNORMAL LOW (ref 14–54)
AST: 17 U/L (ref 15–41)
Albumin: 2.8 g/dL — ABNORMAL LOW (ref 3.5–5.0)
Alkaline Phosphatase: 68 U/L (ref 38–126)
Anion gap: 8 (ref 5–15)
BUN: <5 mg/dL — ABNORMAL LOW (ref 6–20)
CO2: 25 mmol/L (ref 22–32)
Calcium: 9.1 mg/dL (ref 8.9–10.3)
Chloride: 104 mmol/L (ref 101–111)
Creatinine, Ser: 0.79 mg/dL (ref 0.44–1.00)
GFR calc Af Amer: >60 mL/min (ref >60)
GFR calc non Af Amer: >60 mL/min (ref >60)
Glucose, Bld: 96 mg/dL (ref 65–99)
Potassium: 3.2 mmol/L — ABNORMAL LOW (ref 3.5–5.1)
Sodium: 137 mmol/L (ref 135–145)
Total Bilirubin: 0.4 mg/dL (ref 0.3–1.2)
Total Protein: 5.7 g/dL — ABNORMAL LOW (ref 6.5–8.1)

## 2016-01-20 LAB — WET PREP, GENITAL
Clue Cells Wet Prep HPF POC: NONE SEEN
Sperm: NONE SEEN
Trich, Wet Prep: NONE SEEN
Yeast Wet Prep HPF POC: NONE SEEN

## 2016-01-20 LAB — TYPE AND SCREEN
ABO/RH(D): A POS
Antibody Screen: NEGATIVE

## 2016-01-20 LAB — URINE MICROSCOPIC-ADD ON

## 2016-01-20 LAB — PROTEIN / CREATININE RATIO, URINE
Creatinine, Urine: 79 mg/dL
Protein Creatinine Ratio: 0.19 mg/mg{Cre} — ABNORMAL HIGH (ref 0.00–0.15)
Total Protein, Urine: 15 mg/dL

## 2016-01-20 MED ORDER — OXCARBAZEPINE 300 MG PO TABS
600.0000 mg | ORAL_TABLET | Freq: Two times a day (BID) | ORAL | Status: DC
Start: 2016-01-20 — End: 2016-01-27
  Administered 2016-01-20 – 2016-01-27 (×15): 600 mg via ORAL
  Filled 2016-01-20 (×16): qty 2

## 2016-01-20 MED ORDER — ACETAMINOPHEN 325 MG PO TABS
650.0000 mg | ORAL_TABLET | ORAL | Status: DC | PRN
  Administered 2016-01-22 – 2016-01-24 (×4): 650 mg via ORAL
  Filled 2016-01-20 (×6): qty 2

## 2016-01-20 MED ORDER — PROMETHAZINE HCL 25 MG/ML IJ SOLN
12.5000 mg | Freq: Four times a day (QID) | INTRAMUSCULAR | Status: DC | PRN
  Administered 2016-01-20 – 2016-01-21 (×2): 12.5 mg via INTRAVENOUS
  Filled 2016-01-20 (×2): qty 1

## 2016-01-20 MED ORDER — CALCIUM CARBONATE ANTACID 500 MG PO CHEW
2.0000 | CHEWABLE_TABLET | ORAL | Status: DC | PRN
  Administered 2016-01-20 (×2): 400 mg via ORAL
  Filled 2016-01-20 (×2): qty 2

## 2016-01-20 MED ORDER — CITALOPRAM HYDROBROMIDE 20 MG PO TABS
20.0000 mg | ORAL_TABLET | Freq: Every day | ORAL | Status: DC
  Administered 2016-01-20 – 2016-01-27 (×8): 20 mg via ORAL
  Filled 2016-01-20 (×8): qty 1

## 2016-01-20 MED ORDER — PRENATAL MULTIVITAMIN CH
1.0000 | ORAL_TABLET | Freq: Every day | ORAL | Status: DC
  Administered 2016-01-20 – 2016-01-26 (×7): 1 via ORAL
  Filled 2016-01-20 (×8): qty 1

## 2016-01-20 MED ORDER — PANTOPRAZOLE SODIUM 40 MG PO TBEC
40.0000 mg | DELAYED_RELEASE_TABLET | Freq: Every day | ORAL | Status: DC
  Administered 2016-01-20 – 2016-01-27 (×8): 40 mg via ORAL
  Filled 2016-01-20 (×8): qty 1

## 2016-01-20 MED ORDER — DOCUSATE SODIUM 100 MG PO CAPS
100.0000 mg | ORAL_CAPSULE | Freq: Every day | ORAL | Status: DC
  Administered 2016-01-20 – 2016-01-27 (×8): 100 mg via ORAL
  Filled 2016-01-20 (×9): qty 1

## 2016-01-20 MED ORDER — BETAMETHASONE SOD PHOS & ACET 6 (3-3) MG/ML IJ SUSP
12.0000 mg | Freq: Once | INTRAMUSCULAR | Status: AC
  Administered 2016-01-20: 12 mg via INTRAMUSCULAR
  Filled 2016-01-20: qty 2

## 2016-01-20 MED ORDER — BETAMETHASONE SOD PHOS & ACET 6 (3-3) MG/ML IJ SUSP
12.0000 mg | Freq: Once | INTRAMUSCULAR | Status: AC
  Administered 2016-01-21: 12 mg via INTRAMUSCULAR
  Filled 2016-01-20: qty 2

## 2016-01-20 MED ORDER — ZOLPIDEM TARTRATE 5 MG PO TABS
5.0000 mg | ORAL_TABLET | Freq: Every evening | ORAL | Status: DC | PRN
  Administered 2016-01-20 – 2016-01-26 (×6): 5 mg via ORAL
  Filled 2016-01-20 (×5): qty 1

## 2016-01-20 MED ORDER — FAMOTIDINE 20 MG PO TABS
20.0000 mg | ORAL_TABLET | Freq: Once | ORAL | Status: AC
  Administered 2016-01-20: 20 mg via ORAL
  Filled 2016-01-20: qty 1

## 2016-01-20 MED ORDER — ONDANSETRON 4 MG PO TBDP
4.0000 mg | ORAL_TABLET | Freq: Three times a day (TID) | ORAL | Status: DC | PRN
  Administered 2016-01-20 – 2016-01-26 (×5): 4 mg via ORAL
  Filled 2016-01-20 (×7): qty 1

## 2016-01-20 NOTE — H&P (Signed)
ANTEPARTUM ADMISSION HISTORY AND PHYSICAL NOTE   History of Present Illness: Connie [name] is a 25yo G2P1001at [redacted]w[redacted]d admitted for gestation HTN and possible abruption.   Patient reports the fetal movement as active. Patient reports uterine contraction  activity as infrequent. Patient reports  vaginal bleeding as moderate - through underwear and pants, in toilet, and on toilet paper. This appeared like period blood to her. She reports 8/10 pain that comes and goes over about 10 mins. Patient describes fluid per vagina as intact. Fetal presentation is unsure.  Prenatal History/Complications:  Past Medical History: Past Medical History  Diagnosis Date  . GERD (gastroesophageal reflux disease)   . Bipolar 1 disorder (HCC)   . Depression   . Headache(784.0)   . Urinary tract infection   . Carpal tunnel syndrome on both sides     with preg  . Bipolar disorder (HCC)   . Abnormal Pap smear   . Vaginal discharge 03/11/2013  . RLQ abdominal pain 03/11/2013  . BV (bacterial vaginosis) 03/11/2013  . Ovarian cyst, right   . Obesity 06/17/2013  . Unspecified symptom associated with female genital organs 01/06/2014  . Kidney stones   . Pregnant 07/14/2015  . Nausea 07/14/2015  . Pregnant   . Hypertension   . Pregnancy induced hypertension     Past Surgical History: Past Surgical History  Procedure Laterality Date  . Tonsillectomy and adenoidectomy Bilateral   . Tonsillectomy    . Myringotomy      Obstetrical History: OB History    Gravida Para Term Preterm AB TAB SAB Ectopic Multiple Living   2 1 1  0 0 0 0 0 0 1      Social History: Social History   Social History  . Marital Status: Single    Spouse Name: N/A  . Number of Children: N/A  . Years of Education: N/A   Social History Main Topics  . Smoking status: Current Every Day Smoker -- 0.25 packs/day for 11 years    Types: Cigarettes    Last Attempt to Quit: 04/19/2012  . Smokeless tobacco: Never Used  . Alcohol Use:  No  . Drug Use: No  . Sexual Activity: Not Currently    Birth Control/ Protection: None   Other Topics Concern  . None   Social History Narrative   ** Merged History Encounter **        Family History: Family History  Problem Relation Age of Onset  . Hypertension Mother   . Hypertension Father   . Hypertension Maternal Aunt   . Hypertension Maternal Uncle   . Hypertension Paternal Aunt   . Hypertension Paternal Uncle   . Diabetes Maternal Grandfather   . Diabetes Paternal Grandmother   . Other Son     swallowing problems    Allergies: Allergies  Allergen Reactions  . Sulfa Antibiotics Other (See Comments)    Chest pains  . Bactrim [Sulfamethoxazole-Trimethoprim] Other (See Comments)    Chest pain   . Hydrocodone Itching and Other (See Comments)    Chest pains    Prescriptions prior to admission  Medication Sig Dispense Refill Last Dose  . acetaminophen (TYLENOL) 325 MG tablet Take 650 mg by mouth every 6 (six) hours as needed.   01/19/2016 at Unknown time  . aspirin 81 MG tablet Take 81 mg by mouth daily.   01/19/2016 at Unknown time  . citalopram (CELEXA) 20 MG tablet Take 20 mg by mouth daily.   01/19/2016 at Unknown time  . omeprazole (  PRILOSEC) 20 MG capsule Take 20 mg by mouth daily.   01/19/2016 at Unknown time  . oxcarbazepine (TRILEPTAL) 600 MG tablet Take 600 mg by mouth 2 (two) times daily.   01/19/2016 at Unknown time  . prenatal vitamin w/FE, FA (PRENATAL 1 + 1) 27-1 MG TABS tablet Take 1 tablet by mouth daily at 12 noon. 30 each 11 01/19/2016 at Unknown time  . promethazine (PHENERGAN) 25 MG tablet Take 1 tablet (25 mg total) by mouth every 6 (six) hours as needed for nausea or vomiting. 30 tablet 1 Past Week at Unknown time    Results for orders placed or performed during the hospital encounter of 01/20/16 (from the past 24 hour(s))  Urinalysis, Routine w reflex microscopic (not at [facility])   Collection Time: 01/20/16  1:40 AM  Result Value Ref Range    Color, Urine YELLOW YELLOW   APPearance CLEAR CLEAR   Specific Gravity, Urine <1.005 (L) 1.005 - 1.030   pH 6.0 5.0 - 8.0   Glucose, UA NEGATIVE NEGATIVE mg/dL   Hgb urine dipstick MODERATE (A) NEGATIVE   Bilirubin Urine NEGATIVE NEGATIVE   Ketones, ur NEGATIVE NEGATIVE mg/dL   Protein, ur NEGATIVE NEGATIVE mg/dL   Nitrite NEGATIVE NEGATIVE   Leukocytes, UA SMALL (A) NEGATIVE  Urine rapid drug screen (hosp performed)   Collection Time: 01/20/16  1:40 AM  Result Value Ref Range   Opiates NONE DETECTED NONE DETECTED   Cocaine NONE DETECTED NONE DETECTED   Benzodiazepines NONE DETECTED NONE DETECTED   Amphetamines NONE DETECTED NONE DETECTED   Tetrahydrocannabinol NONE DETECTED NONE DETECTED   Barbiturates NONE DETECTED NONE DETECTED  Urine microscopic-add on   Collection Time: 01/20/16  1:40 AM  Result Value Ref Range   Squamous Epithelial / LPF 0-5 (A) NONE SEEN   WBC, UA 0-5 0 - 5 WBC/hpf   RBC / HPF 6-30 0 - 5 RBC/hpf   Bacteria, UA FEW (A) NONE SEEN  Protein / creatinine ratio, urine   Collection Time: 01/20/16  1:40 AM  Result Value Ref Range   Creatinine, Urine 79.00 mg/dL   Total Protein, Urine 15 mg/dL   Protein Creatinine Ratio 0.19 (H) 0.00 - 0.15 mg/mg[Cre]  Wet prep, genital   Collection Time: 01/20/16  2:35 AM  Result Value Ref Range   Yeast Wet Prep HPF POC NONE SEEN NONE SEEN   Trich, Wet Prep NONE SEEN NONE SEEN   Clue Cells Wet Prep HPF POC NONE SEEN NONE SEEN   WBC, Wet Prep HPF POC MODERATE (A) NONE SEEN   Sperm NONE SEEN   CBC   Collection Time: 01/20/16  3:27 AM  Result Value Ref Range   WBC 11.2 (H) 4.0 - 10.5 K/uL   RBC 3.37 (L) 3.87 - 5.11 MIL/uL   Hemoglobin 10.5 (L) 12.0 - 15.0 g/dL   HCT 16.1 (L) 09.6 - 04.5 %   MCV 90.8 78.0 - 100.0 fL   MCH 31.2 26.0 - 34.0 pg   MCHC 34.3 30.0 - 36.0 g/dL   RDW 40.9 81.1 - 91.4 %   Platelets 232 150 - 400 K/uL  Comprehensive metabolic panel   Collection Time: 01/20/16  3:27 AM  Result Value Ref Range    Sodium 137 135 - 145 mmol/L   Potassium 3.2 (L) 3.5 - 5.1 mmol/L   Chloride 104 101 - 111 mmol/L   CO2 25 22 - 32 mmol/L   Glucose, Bld 96 65 - 99 mg/dL   BUN <5 (L)  6 - 20 mg/dL   Creatinine, Ser 1.61 0.44 - 1.00 mg/dL   Calcium 9.1 8.9 - 09.6 mg/dL   Total Protein 5.7 (L) 6.5 - 8.1 g/dL   Albumin 2.8 (L) 3.5 - 5.0 g/dL   AST 17 15 - 41 U/L   ALT 11 (L) 14 - 54 U/L   Alkaline Phosphatase 68 38 - 126 U/L   Total Bilirubin 0.4 0.3 - 1.2 mg/dL   GFR calc non Af Amer >60 >60 mL/min   GFR calc Af Amer >60 >60 mL/min   Anion gap 8 5 - 15    Patient Active Problem List   Diagnosis Date Noted  . Placental abruption in third trimester 01/20/2016  . Marijuana use 09/13/2015  . Smoker 09/13/2015  . Supervision of normal pregnancy 07/14/2015  . Nausea 07/14/2015  . History of cocaine use 06/30/2014  . Severe episode of recurrent major depressive disorder, without psychotic features (HCC) 06/26/2014  . Obesity 06/17/2013  . Hx of preeclampsia, prior pregnancy, currently pregnant 07/12/2012  . 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 Bipolar affective disorder (HCC) 07/02/2012  . Drug abuse 07/02/2012    Vitals:  T 98.8  pulse 78  resp 18  BP 153/81  SpO2 99 Physical Examination: CONSTITUTIONAL: Well-developed, well-nourished female in no acute distress.  HENT:  Normocephalic, atraumatic, External right and left ear normal. Oropharynx is clear and moist EYES: Conjunctivae and EOM are normal. Pupils are equal, round, and reactive to light. No scleral icterus.  NECK: Normal range of motion, supple, no masses SKIN: Skin is warm and dry. No rash noted. Not diaphoretic. No erythema. No pallor. NEUROLGIC: Alert and oriented to person, place, and time. Normal reflexes, muscle tone coordination. No cranial nerve deficit noted. PSYCHIATRIC: Normal mood and affect. Normal behavior. Normal judgment and thought content. CARDIOVASCULAR: Normal heart rate noted, regular rhythm RESPIRATORY: Effort and  breath sounds normal, no problems with respiration noted ABDOMEN: Soft, nontender, nondistended, gravid. MUSCULOSKELETAL: Normal range of motion. No edema and no tenderness. 2+ distal pulses.  Cervix: Not evaluated Membranes: intact Fetal Monitoring: cat 1 Tocometer: Flat   Labs: Results for orders placed or performed during the hospital encounter of 01/20/16 (from the past 24 hour(s))  Urinalysis, Routine w reflex microscopic (not at [facility])   Collection Time: 01/20/16  1:40 AM  Result Value Ref Range   Color, Urine YELLOW YELLOW   APPearance CLEAR CLEAR   Specific Gravity, Urine <1.005 (L) 1.005 - 1.030   pH 6.0 5.0 - 8.0   Glucose, UA NEGATIVE NEGATIVE mg/dL   Hgb urine dipstick MODERATE (A) NEGATIVE   Bilirubin Urine NEGATIVE NEGATIVE   Ketones, ur NEGATIVE NEGATIVE mg/dL   Protein, ur NEGATIVE NEGATIVE mg/dL   Nitrite NEGATIVE NEGATIVE   Leukocytes, UA SMALL (A) NEGATIVE  Urine rapid drug screen (hosp performed)   Collection Time: 01/20/16  1:40 AM  Result Value Ref Range   Opiates NONE DETECTED NONE DETECTED   Cocaine NONE DETECTED NONE DETECTED   Benzodiazepines NONE DETECTED NONE DETECTED   Amphetamines NONE DETECTED NONE DETECTED   Tetrahydrocannabinol NONE DETECTED NONE DETECTED   Barbiturates NONE DETECTED NONE DETECTED  Urine microscopic-add on   Collection Time: 01/20/16  1:40 AM  Result Value Ref Range   Squamous Epithelial / LPF 0-5 (A) NONE SEEN   WBC, UA 0-5 0 - 5 WBC/hpf   RBC / HPF 6-30 0 - 5 RBC/hpf   Bacteria, UA FEW (A) NONE SEEN  Protein / creatinine ratio, urine  Collection Time: 01/20/16  1:40 AM  Result Value Ref Range   Creatinine, Urine 79.00 mg/dL   Total Protein, Urine 15 mg/dL   Protein Creatinine Ratio 0.19 (H) 0.00 - 0.15 mg/mg[Cre]  Wet prep, genital   Collection Time: 01/20/16  2:35 AM  Result Value Ref Range   Yeast Wet Prep HPF POC NONE SEEN NONE SEEN   Trich, Wet Prep NONE SEEN NONE SEEN   Clue Cells Wet Prep HPF POC NONE SEEN  NONE SEEN   WBC, Wet Prep HPF POC MODERATE (A) NONE SEEN   Sperm NONE SEEN   CBC   Collection Time: 01/20/16  3:27 AM  Result Value Ref Range   WBC 11.2 (H) 4.0 - 10.5 K/uL   RBC 3.37 (L) 3.87 - 5.11 MIL/uL   Hemoglobin 10.5 (L) 12.0 - 15.0 g/dL   HCT 16.1 (L) 09.6 - 04.5 %   MCV 90.8 78.0 - 100.0 fL   MCH 31.2 26.0 - 34.0 pg   MCHC 34.3 30.0 - 36.0 g/dL   RDW 40.9 81.1 - 91.4 %   Platelets 232 150 - 400 K/uL  Comprehensive metabolic panel   Collection Time: 01/20/16  3:27 AM  Result Value Ref Range   Sodium 137 135 - 145 mmol/L   Potassium 3.2 (L) 3.5 - 5.1 mmol/L   Chloride 104 101 - 111 mmol/L   CO2 25 22 - 32 mmol/L   Glucose, Bld 96 65 - 99 mg/dL   BUN <5 (L) 6 - 20 mg/dL   Creatinine, Ser 7.82 0.44 - 1.00 mg/dL   Calcium 9.1 8.9 - 95.6 mg/dL   Total Protein 5.7 (L) 6.5 - 8.1 g/dL   Albumin 2.8 (L) 3.5 - 5.0 g/dL   AST 17 15 - 41 U/L   ALT 11 (L) 14 - 54 U/L   Alkaline Phosphatase 68 38 - 126 U/L   Total Bilirubin 0.4 0.3 - 1.2 mg/dL   GFR calc non Af Amer >60 >60 mL/min   GFR calc Af Amer >60 >60 mL/min   Anion gap 8 5 - 15   Imaging Studies:  No results found.  Assessment and Plan: #Gestation HTN: BP monitoring on Ante -Pr/Cr 0.19 -Admit to Antenatal -Routine antenatal care -24 hr urine ordered -CBC and CMP reassuring -growth [place] in am  #Possible abruption -Hgb 10.5 -type and screen -continuous monitoring   OB FELLOW HISTORY AND PHYSICAL ATTESTATION  I have seen and examined this patient; I agree with above documentation in the resident's note.    Cherrie Gauze Kenishia Plack 01/21/2016, 5:31 PM

## 2016-01-20 NOTE — MAU Note (Signed)
Low abd pain since 2:30 pm.  Sharp and crampy.  Vag bleeding at midnight, blood in panties, went through shorts and then was on paper when wiping and in toilet.  Cleaned up and looked like it had stopped, put on pad and arrived here by EMS.  No blood on that pad here.  For last 2 weeks, baby not moving as much.  No leaking.

## 2016-01-21 DIAGNOSIS — O219 Vomiting of pregnancy, unspecified: Secondary | ICD-10-CM

## 2016-01-21 DIAGNOSIS — O4593 Premature separation of placenta, unspecified, third trimester: Principal | ICD-10-CM

## 2016-01-21 LAB — CREATININE CLEARANCE, URINE, 24 HOUR
Collection Interval-CRCL: 24 hours
Creatinine Clearance: 67 mL/min — ABNORMAL LOW (ref 75–115)
Creatinine, 24H Ur: 758 mg/d (ref 600–1800)
Creatinine, Urine: 59.45 mg/dL
Urine Total Volume-CRCL: 1275 mL

## 2016-01-21 MED ORDER — SODIUM CHLORIDE 0.9% FLUSH
3.0000 mL | Freq: Two times a day (BID) | INTRAVENOUS | Status: DC
  Administered 2016-01-21 – 2016-01-22 (×3): 3 mL via INTRAVENOUS

## 2016-01-21 MED ORDER — SODIUM CHLORIDE 0.9% FLUSH: 3.0000 mL | INTRAVENOUS | Status: DC | PRN

## 2016-01-21 NOTE — Progress Notes (Signed)
Patient ID: Connie [name], female   DOB: Apr 22, 1991, 25 y.o.   MRN: [id number] FACULTY PRACTICE ANTEPARTUM NOTE  Connie [name] is a 25 y.o. G3P1001 at [redacted]w[redacted]d  who is admitted for vaginal bleeding.   Fetal presentation is cephalic. Length of Stay:  1  Days  Subjective: Patient reports no bleeding since being admitted yesterday.  Continues to have some nausea, improved with medication.   Patient reports good fetal movement.   She reports no uterine contractions She reports no bleeding  She reports no loss of fluid per vagina.  Vitals:  Blood pressure (!) 109/95, pulse 91, temperature 98.7 F (37.1 C), resp. rate 16, height 4\' 11"  (1.499 m), weight 183 lb (83 kg), last menstrual period 05/18/2015, SpO2 98 %, unknown if currently breastfeeding. Physical Examination:  General appearance - alert, well appearing, and in no distress Chest - clear to auscultation, no wheezes, rales or rhonchi, symmetric air entry Heart - normal rate, regular rhythm, normal S1, S2, no murmurs, rubs, clicks or gallops Abdomen - soft, nontender, nondistended, no masses or organomegaly Fundal Height:  size equals dates Extremities: extremities normal, atraumatic, no cyanosis or edema  Membranes:intact  Fetal Monitoring:  Baseline: 105 bpm, Variability: Good {> 6 bpm), Accelerations: Reactive and Decelerations: Absent  Labs:  No results found for this or any previous visit (from the past 24 hour(s)).  Imaging Studies:    Reviewed:  2337g (5#2oz), cephalic, normal anatomy.  Normal AFI. No abruption on [place].  Medications:  Scheduled . betamethasone acetate-betamethasone sodium phosphate  12 mg Intramuscular Once  . citalopram  20 mg Oral Daily  . docusate sodium  100 mg Oral Daily  . oxcarbazepine  600 mg Oral BID  . pantoprazole  40 mg Oral Daily  . prenatal multivitamin  1 tablet Oral Q1200   I have reviewed the patient's current medications.  ASSESSMENT: Patient Active Problem List   Diagnosis  Date Noted  . Placental abruption in third trimester 01/20/2016  . Marijuana use 09/13/2015  . Smoker 09/13/2015  . Supervision of normal pregnancy 07/14/2015  . Nausea 07/14/2015  . History of cocaine use 06/30/2014  . Severe episode of recurrent major depressive disorder, without psychotic features (HCC) 06/26/2014  . Obesity 06/17/2013  . Hx of preeclampsia, prior pregnancy, currently pregnant 07/12/2012  . 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 Bipolar affective disorder (HCC) 07/02/2012  . Drug abuse 07/02/2012    PLAN: 1.  Placental abruption  Currently no bleeding  Wet prep normal  GC/CT normal  Continue NST bid 2.  Nausea  Continue antiemetics 3.  H/o preeclampsia  Intermittently elevated BP.  P;C 0.19 4.  34 week Gesatation Continue routine antenatal care.   Rhona Raider Camillia Marcy, DO 27/23/2017,6:50 AM

## 2016-01-22 DIAGNOSIS — O133 Gestational [pregnancy-induced] hypertension without significant proteinuria, third trimester: Secondary | ICD-10-CM

## 2016-01-22 LAB — GC/CHLAMYDIA PROBE AMP (~~LOC~~) NOT AT ARMC
Chlamydia: NEGATIVE
Neisseria Gonorrhea: NEGATIVE

## 2016-01-22 MED ORDER — POLYETHYLENE GLYCOL 3350 17 G PO PACK
17.0000 g | PACK | Freq: Every day | ORAL | Status: DC | PRN
  Administered 2016-01-22 – 2016-01-25 (×3): 17 g via ORAL
  Filled 2016-01-22 (×3): qty 1

## 2016-01-22 MED ORDER — PROMETHAZINE HCL 25 MG PO TABS
25.0000 mg | ORAL_TABLET | Freq: Four times a day (QID) | ORAL | Status: DC | PRN
  Administered 2016-01-22 – 2016-01-26 (×4): 25 mg via ORAL
  Filled 2016-01-22 (×4): qty 1

## 2016-01-22 MED ORDER — POLYETHYLENE GLYCOL 3350 17 G PO PACK: 17.0000 g | PACK | Freq: Every day | ORAL | Status: DC

## 2016-01-22 NOTE — Progress Notes (Signed)
Patient ID: Connie [name], female   DOB: 01-22-1991, 25 y.o.   MRN: [id number] FACULTY PRACTICE ANTEPARTUM(COMPREHENSIVE) NOTE  KYM SIDDONS is a 25 y.o. G3P1001 at [redacted]w[redacted]d by best clinical estimate who is admitted for vaginal bleeding.   Fetal presentation is cephalic. Length of Stay:  2  Days  Subjective: Just brown staining. Reports new abdominal pain, No contractions. No BM since 4 days ago. Patient reports the fetal movement as active. Patient reports uterine contraction  activity as none. Patient reports  vaginal bleeding as none. Patient describes fluid per vagina as None.  Vitals:  Blood pressure 130/70, pulse 72, temperature 98.2 F (36.8 C), temperature source Oral, resp. rate 20, height 4\' 11"  (1.499 m), weight 183 lb (83 kg), last menstrual period 05/18/2015, SpO2 98 %, unknown if currently breastfeeding. Physical Examination:  General appearance - alert, well appearing, and in no distress Chest - clear to auscultation, no wheezes, rales or rhonchi, symmetric air entry Abdomen - soft, nontender, nondistended, no masses or organomegaly Fundal Height:  size equals dates Extremities: extremities normal, atraumatic, no cyanosis or edema  Membranes:intact  Fetal Monitoring:  Baseline: 130 bpm, Variability: Good {> 6 bpm), Accelerations: Reactive and Decelerations: Absent   Medications:  Scheduled . citalopram  20 mg Oral Daily  . docusate sodium  100 mg Oral Daily  . oxcarbazepine  600 mg Oral BID  . pantoprazole  40 mg Oral Daily  . prenatal multivitamin  1 tablet Oral Q1200  . sodium chloride flush  3 mL Intravenous Q12H   I have reviewed the patient's current medications.  ASSESSMENT: Active Problems:   Placental abruption in third trimester Constipation  PLAN: Antenatal care x 1 wk following bleed if no more. Miralax for constipation.  Reva Bores, MD 01/22/2016,1:30 PM

## 2016-01-23 ENCOUNTER — Encounter (HOSPITAL_COMMUNITY): Payer: Self-pay | Admitting: *Deleted

## 2016-01-23 LAB — CULTURE, BETA STREP (GROUP B ONLY)

## 2016-01-23 NOTE — Progress Notes (Signed)
Patient ID: Connie [name], female   DOB: 1991-06-30, 25 y.o.   MRN: [id number] FACULTY PRACTICE ANTEPARTUM(COMPREHENSIVE) NOTE  Connie [name] is a 25 y.o. G3P1001 at [redacted]w[redacted]d by early ultrasound who is admitted for third trimester bleeding.   Fetal presentation is cephalic. Length of Stay:  3  Days  Subjective: No further bleeding. Still no BM Patient reports the fetal movement as active. Patient reports uterine contraction  activity as none. Patient reports  vaginal bleeding as none. Patient describes fluid per vagina as None.  Vitals:  Blood pressure (!) 108/55, pulse 83, temperature 99.2 F (37.3 C), temperature source Oral, resp. rate 20, height 4\' 11"  (1.499 m), weight 183 lb (83 kg), last menstrual period 05/18/2015, SpO2 98 %, unknown if currently breastfeeding. Physical Examination:  General appearance - alert, well appearing, and in no distress Chest - normal effort Abdomen - gravid, NT Fundal Height:  size equals dates Extremities: extremities normal, atraumatic, no cyanosis or edema  Membranes:intact  Fetal Monitoring:  Baseline: 135 bpm, Variability: Good {> 6 bpm), Accelerations: Reactive and Decelerations: Absent Toco: flat   Medications:  Scheduled . citalopram  20 mg Oral Daily  . docusate sodium  100 mg Oral Daily  . oxcarbazepine  600 mg Oral BID  . pantoprazole  40 mg Oral Daily  . prenatal multivitamin  1 tablet Oral Q1200  . sodium chloride flush  3 mL Intravenous Q12H   I have reviewed the patient's current medications.  ASSESSMENT: Active Problems:   Placental abruption in third trimester Constipation   PLAN: Continue inpatient antenatal care x 7 days post bleed if no further bleeding Repeat Miralax today  Reva Bores, MD 01/23/2016,7:26 AM

## 2016-01-23 NOTE — Progress Notes (Signed)
Fetus active, movement audible.  FHR tracing sketchy secondary fetal movement.

## 2016-01-24 ENCOUNTER — Encounter: Payer: Medicare Other | Admitting: Advanced Practice Midwife

## 2016-01-24 LAB — UIFE/LIGHT CHAINS/TP QN, 24-HR UR
% BETA, Urine: UNDETERMINED %
ALPHA 1 URINE: UNDETERMINED %
Albumin, U: UNDETERMINED %
Alpha 2, Urine: UNDETERMINED %
Free Kappa/Lambda Ratio: 30.17 — ABNORMAL HIGH (ref 2.04–10.37)
Free Lambda Lt Chains,Ur: 3.58 mg/L (ref 0.24–6.66)
Free Lt Chn Excr Rate: 108 mg/L — ABNORMAL HIGH (ref 1.35–24.19)
GAMMA GLOBULIN URINE: UNDETERMINED %
Immunofixation Result, Urine: UNDETERMINED
M-SPIKE %, Urine: UNDETERMINED %
Time: 24 hours
Total Protein, Urine: 10.5 mg/dL
Volume, Urine: 1275 mL

## 2016-01-24 MED ORDER — POTASSIUM CHLORIDE CRYS ER 20 MEQ PO TBCR
30.0000 meq | EXTENDED_RELEASE_TABLET | Freq: Two times a day (BID) | ORAL | Status: AC
  Administered 2016-01-24 – 2016-01-26 (×5): 30 meq via ORAL
  Filled 2016-01-24 (×5): qty 1

## 2016-01-24 MED ORDER — FLEET ENEMA 7-19 GM/118ML RE ENEM: 1.0000 | ENEMA | Freq: Every day | RECTAL | Status: DC | PRN

## 2016-01-24 NOTE — Care Management Important Message (Signed)
Important Message  Patient Details  Name: Connie [name] MRN: [id number] Date of Birth: 10/24/1990   Medicare Important Message Given:  Yes    Renie Ora 01/24/2016, 11:26 AM

## 2016-01-24 NOTE — Progress Notes (Signed)
Patient ID: Connie [name], female   DOB: [birth year], 25 y.o.   MRN: [id number]  ANTEPARTUM PROGRESS NOTE  Patient seen this AM at 7:50AM by provider.  Connie [name] is a 25 y.o. G3P1001 at [redacted]w[redacted]d who is admitted for third trimester bleeding.  Estimated Date of Delivery: 02/29/16 Fetal presentation is cephalic.  Length of Stay:  4 Days. Admitted 01/20/2016  Subjective: Patient reports no further bleeding since day of admission. Still complaining of constipation without a "full bowel movement" for 4 days. Patient reports good fetal movement.  She reports no uterine contractions, no bleeding and no loss of fluid per vagina. Denies HAs, changes in vision, epigastric/abdominal pain, or edema.  Vitals:  Blood pressure 139/67, pulse 77, temperature 98.6 F (37 C), temperature source Oral, resp. rate 18, height 4\' 11"  (1.499 m), weight 183 lb 9.6 oz (83.3 kg), last menstrual period 05/18/2015, SpO2 98 %, unknown if currently breastfeeding. Physical Examination: CONSTITUTIONAL: Well-developed, well-nourished female in no acute distress.  HEENT:  Normocephalic, atraumatic SKIN: Skin is warm and dry. No rash noted.  NEUROLGIC: Alert and oriented to person, place, and time.  PSYCHIATRIC: Normal mood and affect.  CARDIOVASCULAR: Normal heart rate noted, regular rhythm RESPIRATORY: Effort and breath sounds normal, no problems with respiration noted ABDOMEN: Soft, nontender, nondistended, gravid. CERVIX: Dilation: Closed Effacement (%): Thick Exam by:: Dr Ashok Pall  Fetal monitoring: FHR: 110 bpm, Variability: moderate, Accelerations: Present, Decelerations: Absent  Uterine activity: None  No results found for this or any previous visit (from the past 48 hour(s)).  No results found.  Current scheduled medications . citalopram  20 mg Oral Daily  . docusate sodium  100 mg Oral Daily  . oxcarbazepine  600 mg Oral BID  . pantoprazole  40 mg Oral Daily  . prenatal multivitamin  1 tablet Oral  Q1200  . sodium chloride flush  3 mL Intravenous Q12H    I have reviewed the patient's current medications.  ASSESSMENT: Patient Active Problem List   Diagnosis Date Noted  . Placental abruption in third trimester 01/20/2016  . Marijuana use 09/13/2015  . Smoker 09/13/2015  . Supervision of normal pregnancy 07/14/2015  . Nausea 07/14/2015  . History of cocaine use 06/30/2014  . Severe episode of recurrent major depressive disorder, without psychotic features (HCC) 06/26/2014  . Obesity 06/17/2013  . Hx of preeclampsia, prior pregnancy, currently pregnant 07/12/2012  . 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 Bipolar affective disorder (HCC) 07/02/2012  . Drug abuse 07/02/2012   Placental abruption in the third trimester Constipation Tobacco user in pregnancy, with history of cocaine use. Bipolar  PLAN: Continue routine antenatal care for 7 days, if no further bleeding will likely d/c home. Continue Miralax and Colace, will add dulcolax suppository.   Jen Mow, DO, DO OB Fellow [facility], [facility]

## 2016-01-24 NOTE — Progress Notes (Signed)
MD notified of pt's elevated BP.  

## 2016-01-25 NOTE — Progress Notes (Signed)
Patient ID: Connie [name], female   DOB: 1990-08-01, 25 y.o.   MRN: [id number]  ANTEPARTUM PROGRESS NOTE  Patient seen this AM at 7:50AM by provider.  Connie [name] is a 25 y.o. G3P1001 at [redacted]w[redacted]d who is admitted for third trimester bleeding.  Estimated Date of Delivery: 02/29/16 Fetal presentation is cephalic.  Length of Stay:  5 Days. Admitted 01/20/2016  Subjective: Patient reports no further bleeding since day of admission. Eager to leave on Saturday Patient reports good fetal movement.  She reports no uterine contractions, no bleeding and no loss of fluid per vagina. Denies HAs, changes in vision, epigastric/abdominal pain, or edema.  Vitals:  Blood pressure 131/62, pulse 88, temperature 98.9 F (37.2 C), temperature source Oral, resp. rate 16, height 4\' 11"  (1.499 m), weight 183 lb 9.6 oz (83.3 kg), last menstrual period 05/18/2015, SpO2 98 %, unknown if currently breastfeeding. Physical Examination: CONSTITUTIONAL: Well-developed, well-nourished female in no acute distress.  HEENT:  Normocephalic, atraumatic SKIN: Skin is warm and dry. No rash noted.  NEUROLGIC: Alert and oriented to person, place, and time.  PSYCHIATRIC: Normal mood and affect.  CARDIOVASCULAR: Normal heart rate noted, regular rhythm RESPIRATORY: Effort and breath sounds normal, no problems with respiration noted ABDOMEN: Soft, nontender, nondistended, gravid. CERVIX: Dilation: Closed Effacement (%): Thick Exam by:: Dr Ashok Pall  Fetal monitoring: FHR: 130 bpm, Variability: moderate, Accelerations: Present, Decelerations: Absent  Uterine activity: None  No results found for this or any previous visit (from the past 48 hour(s)).  No results found.  Current scheduled medications . citalopram  20 mg Oral Daily  . docusate sodium  100 mg Oral Daily  . oxcarbazepine  600 mg Oral BID  . pantoprazole  40 mg Oral Daily  . potassium chloride  30 mEq Oral BID AC  . prenatal multivitamin  1 tablet Oral Q1200   . sodium chloride flush  3 mL Intravenous Q12H    I have reviewed the patient's current medications.  ASSESSMENT: Patient Active Problem List   Diagnosis Date Noted  . Placental abruption in third trimester 01/20/2016  . Marijuana use 09/13/2015  . Smoker 09/13/2015  . Supervision of normal pregnancy 07/14/2015  . Nausea 07/14/2015  . History of cocaine use 06/30/2014  . Severe episode of recurrent major depressive disorder, without psychotic features (HCC) 06/26/2014  . Obesity 06/17/2013  . Hx of preeclampsia, prior pregnancy, currently pregnant 07/12/2012  . 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 Bipolar affective disorder (HCC) 07/02/2012  . Drug abuse 07/02/2012   Placental abruption in the third trimester Constipation Tobacco user in pregnancy, with history of cocaine use. Bipolar  PLAN: Continue routine antenatal care for 7 days, if no further bleeding will likely d/c home on Saturday Advised to increase level of activity on Friday Continue current antepartum care   Amari Burnsworth, MD, DO Faculty Practice, [facility]

## 2016-01-26 NOTE — Progress Notes (Signed)
Patient ID: Connie [name], female   DOB: 1990/07/26, 25 y.o.   MRN: [id number]  ANTEPARTUM PROGRESS NOTE  Patient seen this AM at 7:50AM by provider.  Connie [name] is a 25 y.o. G3P1001 at [redacted]w[redacted]d who is admitted for third trimester bleeding.  Estimated Date of Delivery: 02/29/16 Fetal presentation is cephalic.  Length of Stay:  6 Days. Admitted 01/20/2016  Subjective: Patient reports feeling well without any further vaginal bleeding  Patient reports good fetal movement.  She reports no uterine contractions, no bleeding and no loss of fluid per vagina. Denies HAs, changes in vision, epigastric/abdominal pain, or edema.  Vitals:  Blood pressure 138/65, pulse 79, temperature 98.9 F (37.2 C), temperature source Oral, resp. rate 18, height 4\' 11"  (1.499 m), weight 183 lb 9.6 oz (83.3 kg), last menstrual period 05/18/2015, SpO2 98 %, unknown if currently breastfeeding. Physical Examination: CONSTITUTIONAL: Well-developed, well-nourished female in no acute distress.  HEENT:  Normocephalic, atraumatic SKIN: Skin is warm and dry. No rash noted.  NEUROLGIC: Alert and oriented to person, place, and time.  PSYCHIATRIC: Normal mood and affect.  CARDIOVASCULAR: Normal heart rate noted, regular rhythm RESPIRATORY: Effort and breath sounds normal, no problems with respiration noted ABDOMEN: Soft, nontender, nondistended, gravid. CERVIX: Dilation: Closed Effacement (%): Thick Exam by:: Dr Ashok Pall  Fetal monitoring: FHR: 130 bpm, Variability: moderate, Accelerations: Present, Decelerations: Absent  Uterine activity: None  No results found for this or any previous visit (from the past 48 hour(s)).  No results found.  Current scheduled medications . citalopram  20 mg Oral Daily  . docusate sodium  100 mg Oral Daily  . oxcarbazepine  600 mg Oral BID  . pantoprazole  40 mg Oral Daily  . potassium chloride  30 mEq Oral BID AC  . prenatal multivitamin  1 tablet Oral Q1200  . sodium chloride  flush  3 mL Intravenous Q12H    I have reviewed the patient's current medications.  ASSESSMENT: Patient Active Problem List   Diagnosis Date Noted  . Placental abruption in third trimester 01/20/2016  . Marijuana use 09/13/2015  . Smoker 09/13/2015  . Supervision of normal pregnancy 07/14/2015  . Nausea 07/14/2015  . History of cocaine use 06/30/2014  . Severe episode of recurrent major depressive disorder, without psychotic features (HCC) 06/26/2014  . Obesity 06/17/2013  . Hx of preeclampsia, prior pregnancy, currently pregnant 07/12/2012  . 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 Bipolar affective disorder (HCC) 07/02/2012  . Drug abuse 07/02/2012   Placental abruption in the third trimester Tobacco user in pregnancy, with history of cocaine use. Bipolar  PLAN: Continue routine antenatal care, if no further bleeding will likely d/c home on Saturday Advised to increase level of activity today Continue current antepartum care   Nesanel Aguila, MD, DO Faculty Practice, [facility]

## 2016-01-27 DIAGNOSIS — Z3A35 35 weeks gestation of pregnancy: Secondary | ICD-10-CM

## 2016-01-27 LAB — BASIC METABOLIC PANEL
Anion gap: 7 (ref 5–15)
BUN: 5 mg/dL — ABNORMAL LOW (ref 6–20)
CO2: 22 mmol/L (ref 22–32)
Calcium: 8.4 mg/dL — ABNORMAL LOW (ref 8.9–10.3)
Chloride: 106 mmol/L (ref 101–111)
Creatinine, Ser: 0.47 mg/dL (ref 0.44–1.00)
GFR calc Af Amer: >60 mL/min (ref >60)
GFR calc non Af Amer: >60 mL/min (ref >60)
Glucose, Bld: 96 mg/dL (ref 65–99)
Potassium: 3.6 mmol/L (ref 3.5–5.1)
Sodium: 135 mmol/L (ref 135–145)

## 2016-01-27 NOTE — Discharge Summary (Signed)
Antenatal Physician Discharge Summary  Patient ID: Connie [name] MRN: [id number] DOB/AGE: 26-Jun-1991 25 y.o.  Admit date: 01/20/2016 Discharge date: 01/27/2016  Admission Diagnoses: Vaginal bleeding in 3rd trimester, GHTN,  [redacted]w[redacted]d  Discharge Diagnoses: Chronic abruption; GHTN, [redacted]w[redacted]d  Prenatal Procedures: NST and ultrasound  Hospital Course:  This is a 25 y.o. G3P1001 with IUP at [redacted]w[redacted]d admitted for [facility] and vaginal bleeding. Initial Hgb was 10.5.  She was given betamethasone x 2 doses. She was observed, fetal heart rate monitoring remained reassuring, and she had no further bleeding or other maternal-fetal concerns. She was deemed stable for discharge to home with outpatient follow up. She will need to deliver at 37 weeks given GHTN and abruption; or earlier if indicated.  Will also need twice a week testing.  Discharge Exam: Temp:  [97.8 F (36.6 C)-99.2 F (37.3 C)] 97.8 F (36.6 C) (07/29 0817) Pulse Rate:  [77-85] 77 (07/28 1950) Resp:  [16-18] 18 (07/29 0817) BP: (124-141)/(57-79) 139/57 (07/28 1950) Physical Examination: CONSTITUTIONAL: Well-developed, well-nourished female in no acute distress.  HENT:  Normocephalic, atraumatic, External right and left ear normal. Oropharynx is clear and moist EYES: Conjunctivae and EOM are normal. Pupils are equal, round, and reactive to light. No scleral icterus.  NECK: Normal range of motion, supple, no masses SKIN: Skin is warm and dry. No rash noted. Not diaphoretic. No erythema. No pallor. NEUROLGIC: Alert and oriented to person, place, and time. Normal reflexes, muscle tone coordination. No cranial nerve deficit noted. PSYCHIATRIC: Normal mood and affect. Normal behavior. Normal judgment and thought content. CARDIOVASCULAR: Normal heart rate noted, regular rhythm RESPIRATORY: Effort and breath sounds normal, no problems with respiration noted MUSCULOSKELETAL: Normal range of motion. No edema and no tenderness. 2+ distal  pulses. ABDOMEN: Soft, nontender, nondistended, gravid. CERVIX: Dilation: Closed Effacement (%): Thick Exam by:: Dr Ashok Pall  Fetal monitoring: FHR: 140 bpm, Variability: moderate, Accelerations: Present, Decelerations: Absent  Uterine activity: None  Significant Diagnostic Studies:  Results for orders placed or performed during the hospital encounter of 01/20/16 (from the past 336 hour(s))  GC/Chlamydia probe amp ([place])not at [facility]   Collection Time: 01/20/16 12:00 AM  Result Value Ref Range   Chlamydia Negative    Neisseria gonorrhea Negative   Urinalysis, Routine w reflex microscopic (not at [facility])   Collection Time: 01/20/16  1:40 AM  Result Value Ref Range   Color, Urine YELLOW YELLOW   APPearance CLEAR CLEAR   Specific Gravity, Urine <1.005 (L) 1.005 - 1.030   pH 6.0 5.0 - 8.0   Glucose, UA NEGATIVE NEGATIVE mg/dL   Hgb urine dipstick MODERATE (A) NEGATIVE   Bilirubin Urine NEGATIVE NEGATIVE   Ketones, ur NEGATIVE NEGATIVE mg/dL   Protein, ur NEGATIVE NEGATIVE mg/dL   Nitrite NEGATIVE NEGATIVE   Leukocytes, UA SMALL (A) NEGATIVE  Urine rapid drug screen (hosp performed)   Collection Time: 01/20/16  1:40 AM  Result Value Ref Range   Opiates NONE DETECTED NONE DETECTED   Cocaine NONE DETECTED NONE DETECTED   Benzodiazepines NONE DETECTED NONE DETECTED   Amphetamines NONE DETECTED NONE DETECTED   Tetrahydrocannabinol NONE DETECTED NONE DETECTED   Barbiturates NONE DETECTED NONE DETECTED  Urine microscopic-add on   Collection Time: 01/20/16  1:40 AM  Result Value Ref Range   Squamous Epithelial / LPF 0-5 (A) NONE SEEN   WBC, UA 0-5 0 - 5 WBC/hpf   RBC / HPF 6-30 0 - 5 RBC/hpf   Bacteria, UA FEW (A) NONE SEEN  Protein /  creatinine ratio, urine   Collection Time: 01/20/16  1:40 AM  Result Value Ref Range   Creatinine, Urine 79.00 mg/dL   Total Protein, Urine 15 mg/dL   Protein Creatinine Ratio 0.19 (H) 0.00 - 0.15 mg/mg[Cre]  Wet prep, genital   Collection Time:  01/20/16  2:35 AM  Result Value Ref Range   Yeast Wet Prep HPF POC NONE SEEN NONE SEEN   Trich, Wet Prep NONE SEEN NONE SEEN   Clue Cells Wet Prep HPF POC NONE SEEN NONE SEEN   WBC, Wet Prep HPF POC MODERATE (A) NONE SEEN   Sperm NONE SEEN   CBC   Collection Time: 01/20/16  3:27 AM  Result Value Ref Range   WBC 11.2 (H) 4.0 - 10.5 K/uL   RBC 3.37 (L) 3.87 - 5.11 MIL/uL   Hemoglobin 10.5 (L) 12.0 - 15.0 g/dL   HCT 50.0 (L) 93.8 - 18.2 %   MCV 90.8 78.0 - 100.0 fL   MCH 31.2 26.0 - 34.0 pg   MCHC 34.3 30.0 - 36.0 g/dL   RDW 99.3 71.6 - 96.7 %   Platelets 232 150 - 400 K/uL  Comprehensive metabolic panel   Collection Time: 01/20/16  3:27 AM  Result Value Ref Range   Sodium 137 135 - 145 mmol/L   Potassium 3.2 (L) 3.5 - 5.1 mmol/L   Chloride 104 101 - 111 mmol/L   CO2 25 22 - 32 mmol/L   Glucose, Bld 96 65 - 99 mg/dL   BUN <5 (L) 6 - 20 mg/dL   Creatinine, Ser 8.93 0.44 - 1.00 mg/dL   Calcium 9.1 8.9 - 81.0 mg/dL   Total Protein 5.7 (L) 6.5 - 8.1 g/dL   Albumin 2.8 (L) 3.5 - 5.0 g/dL   AST 17 15 - 41 U/L   ALT 11 (L) 14 - 54 U/L   Alkaline Phosphatase 68 38 - 126 U/L   Total Bilirubin 0.4 0.3 - 1.2 mg/dL   GFR calc non Af Amer >60 >60 mL/min   GFR calc Af Amer >60 >60 mL/min   Anion gap 8 5 - 15  Type and screen [facility]   Collection Time: 01/20/16  5:03 AM  Result Value Ref Range   ABO/RH(D) A POS    Antibody Screen NEG    Sample Expiration 01/23/2016   Culture, beta strep (group b only)   Collection Time: 01/20/16  5:20 AM  Result Value Ref Range   Specimen Description VAGINAL/RECTAL    Special Requests NONE    Culture      NO BETA HEMOLYTIC STREPTOCOCCI ISOLATED Performed at [facility]    Report Status 01/23/2016 FINAL   IFE, 24hr Urine (w Total Protein)   Collection Time: 01/20/16  5:50 AM  Result Value Ref Range   Time 24 hours   Volume, Urine 1,275 mL   Total Protein, Urine 10.5 Not Estab. mg/dL   Total Protein,  Urine-Ur/day Comment 30 - 150 mg/24 hr   Albumin, U QUANTITY NOT SUFFICIENT, UNABLE TO PERFORM TEST %   ALPHA 1 URINE QUANTITY NOT SUFFICIENT, UNABLE TO PERFORM TEST %   Alpha 2, Urine QUANTITY NOT SUFFICIENT, UNABLE TO PERFORM TEST %   % Beta QUANTITY NOT SUFFICIENT, UNABLE TO PERFORM TEST %   GAMMA GLOBULIN URINE QUANTITY NOT SUFFICIENT, UNABLE TO PERFORM TEST %   Free Lt Chn Excr Rate 108.00 (H) 1.35 - 24.19 mg/L   Free Lambda Lt Chains,Ur 3.58 0.24 - 6.66 mg/L  Free Kappa/Lambda Ratio 30.17 (H) 2.04 - 10.37   Immunofixation Result, Urine QUANTITY NOT SUFFICIENT, UNABLE TO PERFORM TEST    M-Spike, % QUANTITY NOT SUFFICIENT, UNABLE TO PERFORM TEST %   M-Spike, Mg/24 Hr Comment Not Observed mg/24 hr   Note: Comment   Creatinine clearance, urine, 24 hour   Collection Time: 01/20/16  5:50 AM  Result Value Ref Range   Urine Total Volume-CRCL 1,275 mL   Collection Interval-CRCL 24 hours   Creatinine, Urine 59.45 mg/dL   Creatinine, 16X Ur 096 600 - 1,800 mg/day   Creatinine Clearance 67 (L) 75 - 115 mL/min  Basic metabolic panel   Collection Time: 01/27/16  5:43 AM  Result Value Ref Range   Sodium 135 135 - 145 mmol/L   Potassium 3.6 3.5 - 5.1 mmol/L   Chloride 106 101 - 111 mmol/L   CO2 22 22 - 32 mmol/L   Glucose, Bld 96 65 - 99 mg/dL   BUN 5 (L) 6 - 20 mg/dL   Creatinine, Ser 0.45 0.44 - 1.00 mg/dL   Calcium 8.4 (L) 8.9 - 10.3 mg/dL   GFR calc non Af Amer >60 >60 mL/min   GFR calc Af Amer >60 >60 mL/min   Anion gap 7 5 - 15  Results for orders placed or performed in visit on 01/18/16 (from the past 336 hour(s))  POCT Urinalysis Dipstick   Collection Time: 01/18/16 10:59 AM  Result Value Ref Range   Color, UA     Clarity, UA     Glucose, UA neg    Bilirubin, UA     Ketones, UA neg    Spec Grav, UA     Blood, UA neg    pH, UA     Protein, UA neg    Urobilinogen, UA     Nitrite, UA neg    Leukocytes, UA small (1+) (A) Negative    Discharge Condition:  Stable  Disposition: 01-Home or Self Care      Medication List    TAKE these medications   acetaminophen 325 MG tablet Commonly known as:  TYLENOL Take 650 mg by mouth every 6 (six) hours as needed for mild pain.   aspirin 81 MG tablet Take 81 mg by mouth daily.   citalopram 20 MG tablet Commonly known as:  CELEXA Take 20 mg by mouth daily.   omeprazole 20 MG capsule Commonly known as:  PRILOSEC Take 20 mg by mouth daily.   oxcarbazepine 600 MG tablet Commonly known as:  TRILEPTAL Take 600 mg by mouth 2 (two) times daily.   prenatal vitamin w/FE, FA 27-1 MG Tabs tablet Take 1 tablet by mouth daily at 12 noon.   promethazine 25 MG tablet Commonly known as:  PHENERGAN Take 1 tablet (25 mg total) by mouth every 6 (six) hours as needed for nausea or vomiting.        SignedTereso Newcomer M.D. 01/27/2016, 8:57 AM

## 2016-01-27 NOTE — Discharge Instructions (Signed)
Placental Abruption  Your placenta is the organ that nourishes your unborn baby (fetus). Your baby gets his or her blood supply and nutrients through your placenta. It is your baby's life support system. It is attached to the inside of your uterus until after your baby is born.   Placental abruption is when the placenta partly or completely separates from the uterus before your baby is born. This is rare, but it can happen any time after 20 weeks of pregnancy. A small separation may not cause problems, but a large separation may be dangerous for you and your baby.  CAUSES   Most of the time the cause of a placental abruption is unknown. Though it is rare, a placental abruption can be caused by:    An abdominal injury.    The baby turning from a buttocks-first position (breech presentation) or a sideways position (transverse) to a headfirst position (cephalic).    Delivering the first of multiple babies (twins, triplets, or more).    Sudden loss of amniotic fluid (premature rupture of the membranes).    An abnormally short umbilical cord.  RISK FACTORS  Some risk factors make a placental abruption more likely, including:   History of placental abruption.   High blood pressure (hypertension).   Smoking.   Alcohol intake.   Blood clotting problems.   Too much amniotic fluid.   Having had multiples (twins or triplets or more).   Seizures and convulsions.   Diabetes mellitus.   Having had more than four children.   Age 27 years or older.   Illegal drug use.   Injury to your abdomen.  SIGNS AND SYMPTOMS   A small placental abruption may not cause symptoms. If you do have symptoms, they may include:   Mild abdominal pain.   Slight vaginal bleeding.  Symptoms of severe placental abruption depend on the size of the separation and the stage of pregnancy. Symptoms may include:    Sudden pain in your uterus.   Abdominal pain.   Vaginal bleeding.   Tender uterus.   Severe abdominal pain with  tenderness.   Continual contractions of your uterus.   Back pain.   Weakness, light-headedness.  DIAGNOSIS   Placental abruption is suspected when a pregnant woman develops sudden pain in her uterus. The health care provider will check whether the uterus is very tender, hard, and enlarging and whether the baby has an abnormal heart rate or rhythm. Ultrasonography (commonly called an ultrasound) will be done. Blood work will also be done to make sure that there are enough healthy red blood cells and that there are no clotting problems or signs of too much blood loss.  TREATMENT   Placental abruption is usually an emergency. It requires treatment right away. Your treatment will depend on:    The amount of bleeding.   Whether you or you baby are in distress.   The stage of your pregnancy.   The maturity of the baby.  Treatment for partial separation of the placenta is bed rest and close observation. You also may need a blood transfusion or to receive fluids through an IV tube. Treatment for complete placental separation is delivery of your baby. You may have a cesarean delivery if your baby is in distress.  HOME CARE INSTRUCTIONS    Only take medicines as directed by your health care provider.   Arrange for help at home before and after you deliver the baby, especially if you had a cesarean delivery or   lost a lot of blood.   Get plenty of rest and sleep.   Do not have sexual intercourse until your health care provider says it is okay.   Do not use tampons or douche unless your health care provider says it is okay.  SEEK MEDICAL CARE IF:   You have light vaginal bleeding or spotting.   You have any type of trauma, such as a fall or jolt during an accident.   You are having trouble avoiding drugs, alcohol, or smoking.  SEEK IMMEDIATE MEDICAL CARE IF:   You have vaginal bleeding.   You have abdominal pain.   You have continuous uterine contractions.   You have a hard, tender uterus.   You do not feel  the baby move, or the baby moves very little.  MAKE SURE YOU:   Understand these instructions.   Will watch your condition.   Will get help right away if you are not doing well or get worse.     This information is not intended to replace advice given to you by your health care provider. Make sure you discuss any questions you have with your health care provider.     Document Released: 06/17/2005 Document Revised: 06/22/2013 Document Reviewed: 04/09/2013  Elsevier Interactive Patient Education 2016 [employer]

## 2016-01-27 NOTE — Progress Notes (Signed)
CSW received a telephone call from bedside nures requesting transportation assistant for patient whom is d/ced.  CSW met with patient, patient's mom, and patient's bf to inquire about the patient's transportation needs.  Patient stated the family was in need of cash and/or a gas for gas for their vechicle.  CSW informed the family that CSW is unable to assist with cash or a gas card and offered bus passes for them. Patient's bf kindly expressed that he will get the money and CSW service was no longer needed.  Patient and patient's mother thanked CSW for meeting with them, and the family existed patient's room.   Laurey Arrow, MSW, LCSW Clinical Social Work [phone]

## 2016-01-29 ENCOUNTER — Encounter: Payer: Self-pay | Admitting: Women's Health

## 2016-01-29 DIAGNOSIS — O133 Gestational [pregnancy-induced] hypertension without significant proteinuria, third trimester: Secondary | ICD-10-CM | POA: Insufficient documentation

## 2016-01-30 ENCOUNTER — Other Ambulatory Visit: Payer: Self-pay | Admitting: Obstetrics and Gynecology

## 2016-01-30 ENCOUNTER — Encounter: Payer: Self-pay | Admitting: Obstetrics & Gynecology

## 2016-01-30 ENCOUNTER — Ambulatory Visit (INDEPENDENT_AMBULATORY_CARE_PROVIDER_SITE_OTHER): Payer: Medicare Other

## 2016-01-30 ENCOUNTER — Ambulatory Visit (INDEPENDENT_AMBULATORY_CARE_PROVIDER_SITE_OTHER): Payer: Medicare Other | Admitting: Obstetrics & Gynecology

## 2016-01-30 VITALS — BP 140/90 | HR 88 | Wt 182.0 lb

## 2016-01-30 DIAGNOSIS — Z3A36 36 weeks gestation of pregnancy: Secondary | ICD-10-CM

## 2016-01-30 DIAGNOSIS — O133 Gestational [pregnancy-induced] hypertension without significant proteinuria, third trimester: Secondary | ICD-10-CM

## 2016-01-30 DIAGNOSIS — Z331 Pregnant state, incidental: Secondary | ICD-10-CM

## 2016-01-30 DIAGNOSIS — O4593 Premature separation of placenta, unspecified, third trimester: Secondary | ICD-10-CM

## 2016-01-30 DIAGNOSIS — Z1389 Encounter for screening for other disorder: Secondary | ICD-10-CM

## 2016-01-30 DIAGNOSIS — O09293 Supervision of pregnancy with other poor reproductive or obstetric history, third trimester: Secondary | ICD-10-CM

## 2016-01-30 DIAGNOSIS — O09893 Supervision of other high risk pregnancies, third trimester: Secondary | ICD-10-CM

## 2016-01-30 DIAGNOSIS — O0993 Supervision of high risk pregnancy, unspecified, third trimester: Secondary | ICD-10-CM

## 2016-01-30 LAB — POCT URINALYSIS DIPSTICK
Blood, UA: NEGATIVE
Glucose, UA: NEGATIVE
Ketones, UA: NEGATIVE
Leukocytes, UA: NEGATIVE
Nitrite, UA: NEGATIVE

## 2016-01-30 NOTE — Progress Notes (Signed)
Fetal Surveillance Testing today:  FHR 133   High Risk Pregnancy Diagnosis(es):   Gestational Hypertension, chronic abruption  G3P1001 [redacted]w[redacted]d Estimated Date of Delivery: 02/29/16  Blood pressure 140/90, pulse 88, weight 182 lb (82.6 kg), last menstrual period 05/18/2015, unknown if currently breastfeeding.  Urinalysis: Negative   HPI: The patient is being seen today for ongoing management of as above. Today she reports no bleeding in 9 days   BP weight and urine results all reviewed and noted. Patient reports good fetal movement, denies any bleeding and no rupture of membranes symptoms or regular contractions.  Fundal Height:  35 Fetal Heart rate:  133 Edema:  none  Patient is without complaints other than noted in her HPI. All questions were answered.  All lab and sonogram results have been reviewed. Comments: abnormal:    Assessment:  1.  Pregnancy at [redacted]w[redacted]d,  Estimated Date of Delivery: 02/29/16 :                          2.  Gestational hypertension, stable                        3.  Chronic abruption, no bleeding  Medication(s) Plans:  none  Treatment Plan:  Twice weekly surveillance, induction at 37 weeks or as clinically indicated  Return in about 3 days (around 02/02/2016) for BPP/sono, HROB, with Dr Despina Hidden. for appointment for high risk OB care  No orders of the defined types were placed in this encounter.  Orders Placed This Encounter  Procedures  . US Fetal BPP W/O Non Stress  . [place] UA Cord Doppler  . POCT urinalysis dipstick

## 2016-01-30 NOTE — Progress Notes (Signed)
Pt denies any problems or concerns at this time.  

## 2016-02-02 ENCOUNTER — Ambulatory Visit (INDEPENDENT_AMBULATORY_CARE_PROVIDER_SITE_OTHER): Payer: Medicare Other

## 2016-02-02 ENCOUNTER — Ambulatory Visit (INDEPENDENT_AMBULATORY_CARE_PROVIDER_SITE_OTHER): Payer: Medicare Other | Admitting: Obstetrics & Gynecology

## 2016-02-02 VITALS — BP 152/84 | HR 90 | Wt 182.0 lb

## 2016-02-02 DIAGNOSIS — O09893 Supervision of other high risk pregnancies, third trimester: Secondary | ICD-10-CM

## 2016-02-02 DIAGNOSIS — Z331 Pregnant state, incidental: Secondary | ICD-10-CM

## 2016-02-02 DIAGNOSIS — Z1389 Encounter for screening for other disorder: Secondary | ICD-10-CM

## 2016-02-02 DIAGNOSIS — Z3A37 37 weeks gestation of pregnancy: Secondary | ICD-10-CM | POA: Diagnosis not present

## 2016-02-02 DIAGNOSIS — O4593 Premature separation of placenta, unspecified, third trimester: Secondary | ICD-10-CM

## 2016-02-02 DIAGNOSIS — O09293 Supervision of pregnancy with other poor reproductive or obstetric history, third trimester: Secondary | ICD-10-CM

## 2016-02-02 DIAGNOSIS — O133 Gestational [pregnancy-induced] hypertension without significant proteinuria, third trimester: Secondary | ICD-10-CM | POA: Diagnosis not present

## 2016-02-02 DIAGNOSIS — O0993 Supervision of high risk pregnancy, unspecified, third trimester: Secondary | ICD-10-CM

## 2016-02-02 NOTE — Progress Notes (Signed)
[place] 36+1 wks,cephalic,BPP,8/8,FHR 148 bpm,normal ov's bilat,ant pl gr 3,RI .65,.64,AFI 16.3 cm

## 2016-02-02 NOTE — Progress Notes (Signed)
Fetal Surveillance Testing today:  BPP 8/8 with normal Dopplers   High Risk Pregnancy Diagnosis(es):   Gestational Hypertension, chronic abruption  G3P1001 [redacted]w[redacted]d Estimated Date of Delivery: 02/29/16  Blood pressure (!) 152/84, pulse 90, weight 182 lb (82.6 kg), last menstrual period 05/18/2015, unknown if currently breastfeeding.  Urinalysis: Positive for 1+ protein   HPI: The patient is being seen today for ongoing management of as above. Today she reports no bleeding no headaches or visual changes   BP weight and urine results all reviewed and noted. Patient reports good fetal movement, denies any bleeding and no rupture of membranes symptoms or regular contractions.  Fundal Height:  35 Fetal Heart rate:  148 Edema:  none  Patient is without complaints other than noted in her HPI. All questions were answered.  All lab and sonogram results have been reviewed. Comments: abnormal:    Assessment:  1.  Pregnancy at [redacted]w[redacted]d,  Estimated Date of Delivery: 02/29/16 :                          2.  Gestational Hypertension with some protein today: check Pr/Cr ratio                        3.  Chronic abruption no bleeding  Medication(s) Plans:  none  Treatment Plan:  NST Monday, cervical  induction of labor next week 02/08/2016(02/07/2016 2345) scheduled  Return in about 3 days (around 02/05/2016) for NST, HROB, GBS with Dr Despina Hidden. for appointment for high risk OB care  No orders of the defined types were placed in this encounter.  Orders Placed This Encounter  Procedures  . POCT urinalysis dipstick

## 2016-02-03 LAB — PROTEIN / CREATININE RATIO, URINE
Creatinine, Urine: 322.2 mg/dL
Protein, Ur: 109.8 mg/dL
Protein/Creat Ratio: 341 mg/g creat — ABNORMAL HIGH (ref 0–200)

## 2016-02-05 ENCOUNTER — Telehealth (HOSPITAL_COMMUNITY): Payer: Self-pay | Admitting: *Deleted

## 2016-02-05 ENCOUNTER — Ambulatory Visit (INDEPENDENT_AMBULATORY_CARE_PROVIDER_SITE_OTHER): Payer: Medicare Other | Admitting: Obstetrics & Gynecology

## 2016-02-05 VITALS — BP 154/86 | HR 100 | Wt 185.0 lb

## 2016-02-05 DIAGNOSIS — Z3A37 37 weeks gestation of pregnancy: Secondary | ICD-10-CM | POA: Diagnosis not present

## 2016-02-05 DIAGNOSIS — Z3685 Encounter for antenatal screening for Streptococcus B: Secondary | ICD-10-CM

## 2016-02-05 DIAGNOSIS — O0993 Supervision of high risk pregnancy, unspecified, third trimester: Secondary | ICD-10-CM

## 2016-02-05 DIAGNOSIS — O4593 Premature separation of placenta, unspecified, third trimester: Secondary | ICD-10-CM | POA: Diagnosis not present

## 2016-02-05 DIAGNOSIS — Z331 Pregnant state, incidental: Secondary | ICD-10-CM

## 2016-02-05 DIAGNOSIS — O1403 Mild to moderate pre-eclampsia, third trimester: Secondary | ICD-10-CM | POA: Diagnosis not present

## 2016-02-05 DIAGNOSIS — O1493 Unspecified pre-eclampsia, third trimester: Secondary | ICD-10-CM

## 2016-02-05 DIAGNOSIS — Z369 Encounter for antenatal screening, unspecified: Secondary | ICD-10-CM

## 2016-02-05 DIAGNOSIS — Z1389 Encounter for screening for other disorder: Secondary | ICD-10-CM

## 2016-02-05 LAB — POCT URINALYSIS DIPSTICK
Blood, UA: NEGATIVE
Glucose, UA: NEGATIVE
Glucose, UA: NEGATIVE
Ketones, UA: NEGATIVE
Leukocytes, UA: NEGATIVE
Nitrite, UA: NEGATIVE
Protein, UA: 1

## 2016-02-05 NOTE — Progress Notes (Signed)
Fetal Surveillance Testing today:  Reactive NST   High Risk Pregnancy Diagnosis(es):   [facility] chronic abruption  G3P1001 503w4d Estimated Date of Delivery: 02/29/16  Blood pressure (!) 154/86, pulse 100, weight 185 lb (83.9 kg), last menstrual period 05/18/2015, unknown if currently breastfeeding.  Urinalysis: Negative   HPI: The patient is being seen today for ongoing management of as above. Today she reports no bleeding   BP weight and urine results all reviewed and noted. Patient reports good fetal movement, denies any bleeding and no rupture of membranes symptoms or regular contractions.  Fundal Height:  36 Fetal Heart rate:  140 Edema:  none  Patient is without complaints other than noted in her HPI. All questions were answered.  All lab and sonogram results have been reviewed. Comments: abnormal:    Assessment:  1.  Pregnancy at 383w4d,  Estimated Date of Delivery: 02/29/16 :                          2.  Pre eclampsia, mild                        3.  Chronic abruption  Medication(s) Plans: baby asa  Treatment Plan:  Induction in 2 days  Return in about 11 days (around 02/16/2016) for BP check, with Dr Despina HiddenEure. for appointment for high risk OB care  No orders of the defined types were placed in this encounter.  Orders Placed This Encounter  Procedures  . GC/Chlamydia Probe Amp  . Strep Gp B NAA  . POCT urinalysis dipstick

## 2016-02-05 NOTE — Telephone Encounter (Signed)
Preadmission screen  

## 2016-02-07 ENCOUNTER — Other Ambulatory Visit: Payer: Self-pay | Admitting: Advanced Practice Midwife

## 2016-02-07 LAB — GC/CHLAMYDIA PROBE AMP
Chlamydia trachomatis, NAA: NEGATIVE
Neisseria gonorrhoeae by PCR: NEGATIVE

## 2016-02-07 LAB — STREP GP B NAA: Strep Gp B NAA: NEGATIVE

## 2016-02-08 ENCOUNTER — Inpatient Hospital Stay (HOSPITAL_COMMUNITY)
Admission: RE | Admit: 2016-02-08 | Discharge: 2016-02-11 | DRG: 767 | Disposition: A | Discharge status: Home / Self Care | Payer: Medicare Other | Source: Ambulatory Visit | Attending: Obstetrics & Gynecology | Admitting: Obstetrics & Gynecology

## 2016-02-08 ENCOUNTER — Encounter (HOSPITAL_COMMUNITY): Payer: Self-pay

## 2016-02-08 ENCOUNTER — Inpatient Hospital Stay (HOSPITAL_COMMUNITY): Payer: Medicare Other | Admitting: Anesthesiology

## 2016-02-08 DIAGNOSIS — Z833 Family history of diabetes mellitus: Secondary | ICD-10-CM

## 2016-02-08 DIAGNOSIS — O99334 Smoking (tobacco) complicating childbirth: Secondary | ICD-10-CM | POA: Diagnosis present

## 2016-02-08 DIAGNOSIS — O1414 Severe pre-eclampsia complicating childbirth: Secondary | ICD-10-CM | POA: Diagnosis present

## 2016-02-08 DIAGNOSIS — O4593 Premature separation of placenta, unspecified, third trimester: Secondary | ICD-10-CM | POA: Diagnosis present

## 2016-02-08 DIAGNOSIS — O09293 Supervision of pregnancy with other poor reproductive or obstetric history, third trimester: Secondary | ICD-10-CM

## 2016-02-08 DIAGNOSIS — Z302 Encounter for sterilization: Secondary | ICD-10-CM | POA: Diagnosis not present

## 2016-02-08 DIAGNOSIS — F17210 Nicotine dependence, cigarettes, uncomplicated: Secondary | ICD-10-CM | POA: Diagnosis present

## 2016-02-08 DIAGNOSIS — Z87442 Personal history of urinary calculi: Secondary | ICD-10-CM

## 2016-02-08 DIAGNOSIS — Z3A37 37 weeks gestation of pregnancy: Secondary | ICD-10-CM

## 2016-02-08 DIAGNOSIS — Z8249 Family history of ischemic heart disease and other diseases of the circulatory system: Secondary | ICD-10-CM | POA: Diagnosis not present

## 2016-02-08 DIAGNOSIS — Z9889 Other specified postprocedural states: Secondary | ICD-10-CM

## 2016-02-08 DIAGNOSIS — O09893 Supervision of other high risk pregnancies, third trimester: Secondary | ICD-10-CM

## 2016-02-08 LAB — CBC
HCT: 31.8 % — ABNORMAL LOW (ref 36.0–46.0)
HCT: 31.3 % — ABNORMAL LOW (ref 36.0–46.0)
Hemoglobin: 10.9 g/dL — ABNORMAL LOW (ref 12.0–15.0)
Hemoglobin: 10.6 g/dL — ABNORMAL LOW (ref 12.0–15.0)
MCH: 30.9 pg (ref 26.0–34.0)
MCH: 31.8 pg (ref 26.0–34.0)
MCHC: 34.3 g/dL (ref 30.0–36.0)
MCHC: 33.9 g/dL (ref 30.0–36.0)
MCV: 90.1 fL (ref 78.0–100.0)
MCV: 94 fL (ref 78.0–100.0)
Platelets: 206 10*3/uL (ref 150–400)
Platelets: 192 10*3/uL (ref 150–400)
RBC: 3.53 MIL/uL — ABNORMAL LOW (ref 3.87–5.11)
RBC: 3.33 MIL/uL — ABNORMAL LOW (ref 3.87–5.11)
RDW: 14.4 % (ref 11.5–15.5)
RDW: 14.4 % (ref 11.5–15.5)
WBC: 9.8 10*3/uL (ref 4.0–10.5)
WBC: 12.7 10*3/uL — ABNORMAL HIGH (ref 4.0–10.5)

## 2016-02-08 LAB — COMPREHENSIVE METABOLIC PANEL
ALT: 10 U/L — ABNORMAL LOW (ref 14–54)
AST: 17 U/L (ref 15–41)
Albumin: 2.9 g/dL — ABNORMAL LOW (ref 3.5–5.0)
Alkaline Phosphatase: 87 U/L (ref 38–126)
Anion gap: 8 (ref 5–15)
BUN: <5 mg/dL — ABNORMAL LOW (ref 6–20)
CO2: 20 mmol/L — ABNORMAL LOW (ref 22–32)
Calcium: 8.8 mg/dL — ABNORMAL LOW (ref 8.9–10.3)
Chloride: 107 mmol/L (ref 101–111)
Creatinine, Ser: 0.57 mg/dL (ref 0.44–1.00)
GFR calc Af Amer: >60 mL/min (ref >60)
GFR calc non Af Amer: >60 mL/min (ref >60)
Glucose, Bld: 98 mg/dL (ref 65–99)
Potassium: 3.1 mmol/L — ABNORMAL LOW (ref 3.5–5.1)
Sodium: 135 mmol/L (ref 135–145)
Total Bilirubin: 0.4 mg/dL (ref 0.3–1.2)
Total Protein: 5.7 g/dL — ABNORMAL LOW (ref 6.5–8.1)

## 2016-02-08 LAB — RPR: RPR Ser Ql: NONREACTIVE

## 2016-02-08 LAB — PROTEIN / CREATININE RATIO, URINE
Creatinine, Urine: 128 mg/dL
Protein Creatinine Ratio: 0.28 mg/mg{Cre} — ABNORMAL HIGH (ref 0.00–0.15)
Total Protein, Urine: 36 mg/dL

## 2016-02-08 LAB — TYPE AND SCREEN
ABO/RH(D): A POS
Antibody Screen: NEGATIVE

## 2016-02-08 MED ORDER — FENTANYL CITRATE (PF) 100 MCG/2ML IJ SOLN
100.0000 ug | INTRAMUSCULAR | Status: DC | PRN
  Administered 2016-02-08 (×4): 100 ug via INTRAVENOUS
  Filled 2016-02-08 (×4): qty 2

## 2016-02-08 MED ORDER — MISOPROSTOL 50MCG HALF TABLET
50.0000 ug | ORAL_TABLET | ORAL | Status: DC
  Administered 2016-02-08 (×2): 50 ug via ORAL
  Filled 2016-02-08 (×2): qty 0.5

## 2016-02-08 MED ORDER — SODIUM CHLORIDE 0.9 % IV SOLN: 10.0000 mg | Freq: Once | INTRAVENOUS | Status: DC

## 2016-02-08 MED ORDER — FAMOTIDINE IN NACL 20-0.9 MG/50ML-% IV SOLN
20.0000 mg | Freq: Once | INTRAVENOUS | Status: AC
  Administered 2016-02-08: 20 mg via INTRAVENOUS
  Filled 2016-02-08: qty 50

## 2016-02-08 MED ORDER — PHENYLEPHRINE 40 MCG/ML (10ML) SYRINGE FOR IV PUSH (FOR BLOOD PRESSURE SUPPORT)
80.0000 ug | PREFILLED_SYRINGE | INTRAVENOUS | Status: DC | PRN
  Filled 2016-02-08: qty 5

## 2016-02-08 MED ORDER — EPHEDRINE 5 MG/ML INJ
10.0000 mg | INTRAVENOUS | Status: DC | PRN
  Filled 2016-02-08: qty 4

## 2016-02-08 MED ORDER — LACTATED RINGERS IV SOLN
INTRAVENOUS | Status: DC
  Administered 2016-02-08 – 2016-02-09 (×2): via INTRAVENOUS

## 2016-02-08 MED ORDER — ZOLPIDEM TARTRATE 5 MG PO TABS
5.0000 mg | ORAL_TABLET | Freq: Every evening | ORAL | Status: DC | PRN
  Administered 2016-02-08: 5 mg via ORAL
  Filled 2016-02-08: qty 1

## 2016-02-08 MED ORDER — BENZONATATE 100 MG PO CAPS
100.0000 mg | ORAL_CAPSULE | Freq: Three times a day (TID) | ORAL | Status: DC | PRN
  Administered 2016-02-08 – 2016-02-09 (×3): 100 mg via ORAL
  Filled 2016-02-08 (×6): qty 1

## 2016-02-08 MED ORDER — DIPHENHYDRAMINE HCL 50 MG/ML IJ SOLN
12.5000 mg | INTRAMUSCULAR | Status: DC | PRN
  Administered 2016-02-09: 12.5 mg via INTRAVENOUS
  Filled 2016-02-08: qty 1

## 2016-02-08 MED ORDER — LACTATED RINGERS IV SOLN
500.0000 mL | INTRAVENOUS | Status: DC | PRN
  Administered 2016-02-08: 500 mL via INTRAVENOUS

## 2016-02-08 MED ORDER — TERBUTALINE SULFATE 1 MG/ML IJ SOLN: 0.2500 mg | Freq: Once | INTRAMUSCULAR | Status: DC | PRN

## 2016-02-08 MED ORDER — ACETAMINOPHEN 325 MG PO TABS
650.0000 mg | ORAL_TABLET | ORAL | Status: DC | PRN
  Administered 2016-02-09: 650 mg via ORAL
  Filled 2016-02-08: qty 2

## 2016-02-08 MED ORDER — PROMETHAZINE HCL 25 MG/ML IJ SOLN
12.5000 mg | Freq: Four times a day (QID) | INTRAMUSCULAR | Status: DC | PRN
  Administered 2016-02-08: 12.5 mg via INTRAVENOUS
  Filled 2016-02-08: qty 1

## 2016-02-08 MED ORDER — LIDOCAINE HCL (PF) 1 % IJ SOLN
30.0000 mL | INTRAMUSCULAR | Status: DC | PRN
  Filled 2016-02-08: qty 30

## 2016-02-08 MED ORDER — FENTANYL 2.5 MCG/ML BUPIVACAINE 1/10 % EPIDURAL INFUSION (WH - ANES)
14.0000 mL/h | INTRAMUSCULAR | Status: DC | PRN
  Administered 2016-02-08 – 2016-02-09 (×2): 14 mL/h via EPIDURAL
  Filled 2016-02-08: qty 125

## 2016-02-08 MED ORDER — FLEET ENEMA 7-19 GM/118ML RE ENEM: 1.0000 | ENEMA | RECTAL | Status: DC | PRN

## 2016-02-08 MED ORDER — ONDANSETRON HCL 4 MG/2ML IJ SOLN
4.0000 mg | Freq: Four times a day (QID) | INTRAMUSCULAR | Status: DC | PRN
  Administered 2016-02-08 (×2): 4 mg via INTRAVENOUS
  Filled 2016-02-08 (×2): qty 2

## 2016-02-08 MED ORDER — LACTATED RINGERS IV SOLN: 500.0000 mL | Freq: Once | INTRAVENOUS | Status: AC

## 2016-02-08 MED ORDER — LIDOCAINE HCL (PF) 1 % IJ SOLN
INTRAMUSCULAR | Status: DC | PRN
  Administered 2016-02-08 (×2): 5 mL

## 2016-02-08 MED ORDER — OXYTOCIN BOLUS FROM INFUSION: 500.0000 mL | Freq: Once | INTRAVENOUS | Status: DC

## 2016-02-08 MED ORDER — OXYTOCIN 40 UNITS IN LACTATED RINGERS INFUSION - SIMPLE MED: 2.5000 [IU]/h | INTRAVENOUS | Status: DC

## 2016-02-08 MED ORDER — FENTANYL 2.5 MCG/ML BUPIVACAINE 1/10 % EPIDURAL INFUSION (WH - ANES)
INTRAMUSCULAR | Status: AC
  Administered 2016-02-08: 23:00:00
  Filled 2016-02-08: qty 125

## 2016-02-08 MED ORDER — OXYTOCIN 40 UNITS IN LACTATED RINGERS INFUSION - SIMPLE MED
1.0000 m[IU]/min | INTRAVENOUS | Status: DC
  Administered 2016-02-08: 2 m[IU]/min via INTRAVENOUS
  Filled 2016-02-08: qty 1000

## 2016-02-08 MED ORDER — SOD CITRATE-CITRIC ACID 500-334 MG/5ML PO SOLN
30.0000 mL | ORAL | Status: DC | PRN
  Administered 2016-02-08: 30 mL via ORAL
  Filled 2016-02-08 (×2): qty 15

## 2016-02-08 NOTE — H&P (Signed)
Connie [name] is a 25 y.o. female G2P1001 @ 37.0wks by LMP and 7w scan presenting for IOL due to mild pre-e. Denies H/A, visual changes, or RUQ pain. Her preg has been followed by the [facility] service and has also been remarkable for 1) chronic abruption requiring weeklong Antenatal stay for bleed 2) pre-e w/ prev preg 3) prev cocaine use- denies current w/ neg UDS x 2 in preg 4) bipolar  OB History    Gravida Para Term Preterm AB Living   0 0 1   SAB TAB Ectopic Multiple Live Births   0 0 0 0 1     Past Medical History:  Diagnosis Date  . Abnormal Pap smear   . Bipolar 1 disorder (HCC)   . Bipolar disorder (HCC)   . BV (bacterial vaginosis) 03/11/2013  . Carpal tunnel syndrome on both sides    with preg  . Depression   . GERD (gastroesophageal reflux disease)   . Headache(784.0)   . Hypertension   . Kidney stones   . Nausea 07/14/2015  . Obesity 06/17/2013  . Ovarian cyst, right   . Pregnancy induced hypertension   . Pregnant 07/14/2015  . Pregnant   . RLQ abdominal pain 03/11/2013  . Unspecified symptom associated with female genital organs 01/06/2014  . Urinary tract infection   . Vaginal discharge 03/11/2013   Past Surgical History:  Procedure Laterality Date  . MYRINGOTOMY    . TONSILLECTOMY    . TONSILLECTOMY AND ADENOIDECTOMY Bilateral    Family History: family history includes Diabetes in her maternal grandfather and paternal grandmother; Hypertension in her father, maternal aunt, maternal uncle, mother, paternal aunt, and paternal uncle; Other in her son. Social History:  reports that she has been smoking Cigarettes.  She has a 2.75 pack-year smoking history. She has never used smokeless tobacco. She reports that she does not drink alcohol or use drugs.     Maternal Diabetes: No Genetic Screening: Normal Maternal Ultrasounds/Referrals: Normal Fetal Ultrasounds or other Referrals:  Referred to Materal Fetal Medicine - chronic abruption Maternal Substance  Abuse:  Yes:  Type: Marijuana, Other: hx cocaine Significant Maternal Medications:  Meds include: Other: Celexa, Trileptal Significant Maternal Lab Results:  Lab values include: Group B Strep negative Other Comments:  IOL for mild pre-e  ROS History    Vitals:   02/08/16 0347 02/08/16 0556  BP: (!) 145/78   Pulse: 91   Resp: 18 18  Temp:     Last menstrual period 05/18/2015, unknown if currently breastfeeding. Exam Physical Exam  Constitutional: She is oriented to person, place, and time. She appears well-developed.  HENT:  Head: Normocephalic.  Neck: Normal range of motion.  Cardiovascular: Normal rate.   Respiratory: Effort normal.  GI:  EFM 120s, +accels, no decels Some UI  Genitourinary: Vagina normal.  Genitourinary Comments: Cx ant/FT/thick/-2/med  Musculoskeletal: Normal range of motion.  Neurological: She is alert and oriented to person, place, and time.  Skin: Skin is warm and dry.  Psychiatric: She has a normal mood and affect. Her behavior is normal. Thought content normal.    CBC    Component Value Date/Time   WBC 9.8 02/08/2016 0230   RBC 3.53 (L) 02/08/2016 0230   HGB 10.9 (L) 02/08/2016 0230   HCT 31.8 (L) 02/08/2016 0230   HCT 33.4 (L) 12/27/2015 0954   PLT 206 02/08/2016 0230   PLT 251 12/27/2015 0954   MCV 90.1 02/08/2016 0230   MCV 94  12/27/2015 0954   MCH 30.9 02/08/2016 0230   MCHC 34.3 02/08/2016 0230   RDW 14.4 02/08/2016 0230   RDW 13.9 12/27/2015 0954   LYMPHSABS 2.2 11/29/2015 0843   MONOABS 0.8 06/09/2015 2256   EOSABS 0.2 11/29/2015 0843   BASOSABS 0.0 11/29/2015 0843   CMP     Component Value Date/Time   NA 135 02/08/2016 0230   NA 141 12/27/2015 0954   K 3.1 (L) 02/08/2016 0230   CL 107 02/08/2016 0230   CO2 20 (L) 02/08/2016 0230   GLUCOSE 98 02/08/2016 0230   BUN <5 (L) 02/08/2016 0230   BUN 3 (L) 12/27/2015 0954   CREATININE 0.57 02/08/2016 0230   CREATININE 0.78 06/17/2013 1030   CALCIUM 8.8 (L) 02/08/2016 0230    PROT 5.7 (L) 02/08/2016 0230   PROT 5.9 (L) 12/27/2015 0954   ALBUMIN 2.9 (L) 02/08/2016 0230   ALBUMIN 3.6 12/27/2015 0954   AST 17 02/08/2016 0230   ALT 10 (L) 02/08/2016 0230   ALKPHOS 87 02/08/2016 0230   BILITOT 0.4 02/08/2016 0230   BILITOT <0.2 12/27/2015 0954   GFRNONAA >60 02/08/2016 0230   GFRAA >60 02/08/2016 0230   Prenatal labs: ABO, Rh: --/--/A POS (07/22 0503) Antibody: NEG (07/22 0503) Rubella: 8.51 (01/25 1550) RPR: Non Reactive (05/31 0843)  HBsAg: Negative (01/25 1550)  HIV: Non Reactive (05/31 0843)  GBS: Negative (08/07 1600)   Assessment/Plan: IUP@37 .0wks Mild pre-e Chronic abruption Unfavorable cx  Admit to Birthing Suites Cx ripening w/ cytotec to start Urine P/C ratio pending   Cheril Slattery CNM 02/08/2016, 1:29 AM

## 2016-02-08 NOTE — Anesthesia Preprocedure Evaluation (Signed)
Anesthesia Evaluation  Patient identified by MRN, date of birth, ID band Patient awake    Reviewed: Allergy & Precautions, H&P , Patient's Chart, lab work & pertinent test results  Airway Mallampati: III  TM Distance: >3 FB Neck ROM: full    Dental no notable dental hx. (+) Teeth Intact   Pulmonary neg pulmonary ROS, Current Smoker, former smoker,    Pulmonary exam normal breath sounds clear to auscultation       Cardiovascular negative cardio ROS   Rhythm:regular Rate:Normal     Neuro/Psych  Headaches, PSYCHIATRIC DISORDERS Anxiety Depression Bipolar Disorder Panic Attacks Neuromuscular disease negative neurological ROS  negative psych ROS   GI/Hepatic negative GI ROS, Neg liver ROS, GERD  Medicated and Controlled,  Endo/Other  Morbid obesity  Renal/GU Renal diseasenegative Renal ROS  negative genitourinary   Musculoskeletal   Abdominal Normal abdominal exam  (+) + obese,   Peds  Hematology negative hematology ROS (+)   Anesthesia Other Findings Pierced tongue and nose  Reproductive/Obstetrics (+) Pregnancy HSV                             Anesthesia Physical  Anesthesia Plan  ASA: III  Anesthesia Plan: Epidural   Post-op Pain Management:    Induction:   Airway Management Planned:   Additional Equipment:   Intra-op Plan:   Post-operative Plan:   Informed Consent: I have reviewed the patients History and Physical, chart, labs and discussed the procedure including the risks, benefits and alternatives for the proposed anesthesia with the patient or authorized representative who has indicated his/her understanding and acceptance.     Plan Discussed with: Anesthesiologist and Surgeon  Anesthesia Plan Comments:         Anesthesia Quick Evaluation

## 2016-02-08 NOTE — Progress Notes (Signed)
LABOR PROGRESS NOTE  Connie BlowBrandi L [name] is a 25 y.o. G3P1001 at 4843w0d  admitted for IOL for chronic abruption.  Subjective: Pt uncomfortable, almost ready for epidural.   Dilation: 4 Effacement (%): 70 Cervical Position: Anterior Station: -2 Presentation: Vertex Exam by::  (Connie Bagnall md)   Assessment / Plan: 25 y.o. G3P1001 at 2543w0d here for IOL for chronic abruption.  Labor: FB out, consider pit after epidural Fetal Wellbeing:  Cat 1 Pain Control:  Desires epidural soon Anticipated MOD:  SVD  Loni MuseKate Tomeko Scoville, MD 02/08/2016, 6:55 PM

## 2016-02-08 NOTE — Anesthesia Pain Management Evaluation Note (Signed)
  CRNA Pain Management Visit Note  Patient: Connie [name], 25 y.o., female  "Hello [facility]. We have an anesthesia team available at all times to provide care throughout the hospital, including epidural management and anesthesia for C-section. I don't know your plan for the delivery whether it a natural birth, water birth, IV sedation, nitrous supplementation, doula or epidural, but we want to meet your pain goals."   1.Was your pain managed to your expectations on prior hospitalizations?   No prior hospitalizations  2.What is your expectation for pain management during this hospitalization?     Epidural  3.How can we help you reach that goal? Epidural  Record the patient's initial score and the patient's pain goal.   Pain: 0  Pain Goal: 5-6 [facility] wants you to be able to say your pain was always managed very well.  Rica RecordsICKELTON,Thurl Boen 02/08/2016

## 2016-02-08 NOTE — Anesthesia Procedure Notes (Signed)
Epidural Patient location during procedure: OB  Staffing Anesthesiologist: Hermie Reagor Performed: anesthesiologist   Preanesthetic Checklist Completed: patient identified, site marked, surgical consent, pre-op evaluation, timeout performed, IV checked, risks and benefits discussed and monitors and equipment checked  Epidural Patient position: sitting Prep: DuraPrep Patient monitoring: heart rate, continuous pulse ox and blood pressure Approach: midline Location: L3-L4 Injection technique: LOR saline  Needle:  Needle type: Tuohy  Needle gauge: 17 G Needle length: 9 cm and 9 Needle insertion depth: 9 cm Catheter type: closed end flexible Catheter size: 20 Guage Catheter at skin depth: 13 cm Test dose: negative  Assessment Events: blood not aspirated, injection not painful, no injection resistance, negative IV test and no paresthesia  Additional Notes Patient identified. Risks/Benefits/Options discussed with patient including but not limited to bleeding, infection, nerve damage, paralysis, failed block, incomplete pain control, headache, blood pressure changes, nausea, vomiting, reactions to medication both or allergic, itching and postpartum back pain. Confirmed with bedside nurse the patient's most recent platelet count. Confirmed with patient that they are not currently taking any anticoagulation, have any bleeding history or any family history of bleeding disorders. Patient expressed understanding and wished to proceed. All questions were answered. Sterile technique was used throughout the entire procedure. Please see nursing notes for vital signs. Test dose was given through epidural needle and negative prior to continuing to dose epidural or start infusion. Warning signs of high block given to the patient including shortness of breath, tingling/numbness in hands, complete motor block, or any concerning symptoms with instructions to call for help. Patient was given instructions  on fall risk and not to get out of bed. All questions and concerns addressed with instructions to call with any issues.     

## 2016-02-08 NOTE — Progress Notes (Signed)
LABOR PROGRESS NOTE  Edwena BlowBrandi L Blassingame is a 25 y.o. G3P1001 at 471w0d  admitted for IOL for chronic abruption.  Subjective: Pt resting comfortably.   Objective: BP 134/74   Pulse 98   Temp 98.8 F (37.1 C) (Oral)   Resp 18   Ht 4\' 11"  (1.499 m)   Wt 83.9 kg (185 lb)   LMP 05/18/2015 (Exact Date)   BMI 37.37 kg/m  or  Vitals:   02/08/16 0752 02/08/16 0840 02/08/16 0933 02/08/16 1055  BP: (!) 150/89 (!) 143/72 (!) 131/57 134/74  Pulse: (!) 117 97 98 98  Resp: 18 16 18 18   Temp:  98.8 F (37.1 C)    TempSrc:  Oral    Weight:      Height:        Dilation: 1.5 Effacement (%): 50 Cervical Position: Anterior Station: -1 Presentation: Vertex Exam by::  (timberlake)  Labs: Lab Results  Component Value Date   WBC 9.8 02/08/2016   HGB 10.9 (L) 02/08/2016   HCT 31.8 (L) 02/08/2016   MCV 90.1 02/08/2016   PLT 206 02/08/2016    Patient Active Problem List   Diagnosis Date Noted  . Gestational hypertension w/o significant proteinuria in 3rd trimester 01/29/2016  . Placental abruption in third trimester 01/20/2016  . Marijuana use 09/13/2015  . Smoker 09/13/2015  . Supervision of other high-risk pregnancy 07/14/2015  . Nausea 07/14/2015  . History of cocaine use 06/30/2014  . Severe episode of recurrent major depressive disorder, without psychotic features (HCC) 06/26/2014  . Obesity 06/17/2013  . Hx of preeclampsia, prior pregnancy, currently pregnant 07/12/2012  . 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 Bipolar affective disorder (HCC) 07/02/2012  . Drug abuse 07/02/2012    Assessment / Plan: 25 y.o. G3P1001 at 2071w0d here for IOL for chronic abruption.  Labor: cytotec x1, FB@1137  Fetal Wellbeing:  Cat 1  Pain Control:  Desires epidural  Anticipated MOD:  SVD   Loni MuseKate Timberlake, MD 02/08/2016, 11:36 AM

## 2016-02-09 ENCOUNTER — Encounter (HOSPITAL_COMMUNITY): Payer: Self-pay

## 2016-02-09 DIAGNOSIS — Z9889 Other specified postprocedural states: Secondary | ICD-10-CM

## 2016-02-09 DIAGNOSIS — O99334 Smoking (tobacco) complicating childbirth: Secondary | ICD-10-CM

## 2016-02-09 DIAGNOSIS — O1414 Severe pre-eclampsia complicating childbirth: Secondary | ICD-10-CM

## 2016-02-09 DIAGNOSIS — O4593 Premature separation of placenta, unspecified, third trimester: Secondary | ICD-10-CM

## 2016-02-09 DIAGNOSIS — Z87442 Personal history of urinary calculi: Secondary | ICD-10-CM

## 2016-02-09 DIAGNOSIS — Z3A37 37 weeks gestation of pregnancy: Secondary | ICD-10-CM

## 2016-02-09 LAB — COMPREHENSIVE METABOLIC PANEL
ALT: 9 U/L — ABNORMAL LOW (ref 14–54)
AST: 24 U/L (ref 15–41)
Albumin: 2.5 g/dL — ABNORMAL LOW (ref 3.5–5.0)
Alkaline Phosphatase: 75 U/L (ref 38–126)
Anion gap: 9 (ref 5–15)
BUN: <5 mg/dL — ABNORMAL LOW (ref 6–20)
CO2: 20 mmol/L — ABNORMAL LOW (ref 22–32)
Calcium: 8 mg/dL — ABNORMAL LOW (ref 8.9–10.3)
Chloride: 105 mmol/L (ref 101–111)
Creatinine, Ser: 0.51 mg/dL (ref 0.44–1.00)
GFR calc Af Amer: >60 mL/min (ref >60)
GFR calc non Af Amer: >60 mL/min (ref >60)
Glucose, Bld: 97 mg/dL (ref 65–99)
Potassium: 3.1 mmol/L — ABNORMAL LOW (ref 3.5–5.1)
Sodium: 134 mmol/L — ABNORMAL LOW (ref 135–145)
Total Bilirubin: 0.8 mg/dL (ref 0.3–1.2)
Total Protein: 5.1 g/dL — ABNORMAL LOW (ref 6.5–8.1)

## 2016-02-09 LAB — CBC WITH DIFFERENTIAL/PLATELET
Basophils Absolute: 0 10*3/uL (ref 0.0–0.1)
Basophils Relative: 0 %
Eosinophils Absolute: 0 10*3/uL (ref 0.0–0.7)
Eosinophils Relative: 0 %
HCT: 31.2 % — ABNORMAL LOW (ref 36.0–46.0)
Hemoglobin: 10.5 g/dL — ABNORMAL LOW (ref 12.0–15.0)
Lymphocytes Relative: 12 %
Lymphs Abs: 1.7 10*3/uL (ref 0.7–4.0)
MCH: 30.5 pg (ref 26.0–34.0)
MCHC: 33.7 g/dL (ref 30.0–36.0)
MCV: 90.7 fL (ref 78.0–100.0)
Monocytes Absolute: 1 10*3/uL (ref 0.1–1.0)
Monocytes Relative: 7 %
Neutro Abs: 11.7 10*3/uL — ABNORMAL HIGH (ref 1.7–7.7)
Neutrophils Relative %: 81 %
Platelets: 194 10*3/uL (ref 150–400)
RBC: 3.44 MIL/uL — ABNORMAL LOW (ref 3.87–5.11)
RDW: 14.3 % (ref 11.5–15.5)
WBC: 14.4 10*3/uL — ABNORMAL HIGH (ref 4.0–10.5)

## 2016-02-09 MED ORDER — COCONUT OIL OIL: 1.0000 "application " | TOPICAL_OIL | Status: DC | PRN

## 2016-02-09 MED ORDER — MAGNESIUM SULFATE BOLUS VIA INFUSION
4.0000 g | Freq: Once | INTRAVENOUS | Status: AC
  Administered 2016-02-09: 4 g via INTRAVENOUS
  Filled 2016-02-09: qty 500

## 2016-02-09 MED ORDER — METOCLOPRAMIDE HCL 10 MG PO TABS
10.0000 mg | ORAL_TABLET | Freq: Once | ORAL | Status: AC
  Administered 2016-02-09: 10 mg via ORAL
  Filled 2016-02-09: qty 1

## 2016-02-09 MED ORDER — PRENATAL MULTIVITAMIN CH
1.0000 | ORAL_TABLET | Freq: Every day | ORAL | Status: DC
Start: 2016-02-09 — End: 2016-02-11
  Administered 2016-02-09 – 2016-02-11 (×3): 1 via ORAL
  Filled 2016-02-09 (×3): qty 1

## 2016-02-09 MED ORDER — SALINE SPRAY 0.65 % NA SOLN
1.0000 | NASAL | Status: DC | PRN
  Administered 2016-02-09: 1 via NASAL
  Filled 2016-02-09: qty 44

## 2016-02-09 MED ORDER — ZOLPIDEM TARTRATE 5 MG PO TABS: 5.0000 mg | ORAL_TABLET | Freq: Every evening | ORAL | Status: DC | PRN

## 2016-02-09 MED ORDER — DIPHENHYDRAMINE HCL 25 MG PO CAPS: 25.0000 mg | ORAL_CAPSULE | Freq: Four times a day (QID) | ORAL | Status: DC | PRN

## 2016-02-09 MED ORDER — IBUPROFEN 600 MG PO TABS
600.0000 mg | ORAL_TABLET | Freq: Four times a day (QID) | ORAL | Status: DC
Start: 2016-02-09 — End: 2016-02-11
  Administered 2016-02-10 – 2016-02-11 (×5): 600 mg via ORAL
  Filled 2016-02-09 (×7): qty 1

## 2016-02-09 MED ORDER — LABETALOL HCL 5 MG/ML IV SOLN: 20.0000 mg | INTRAVENOUS | Status: DC | PRN

## 2016-02-09 MED ORDER — BENZONATATE 100 MG PO CAPS
100.0000 mg | ORAL_CAPSULE | Freq: Three times a day (TID) | ORAL | Status: DC | PRN
  Administered 2016-02-09 – 2016-02-11 (×2): 100 mg via ORAL
  Filled 2016-02-09 (×4): qty 1

## 2016-02-09 MED ORDER — DIBUCAINE 1 % RE OINT: 1.0000 "application " | TOPICAL_OINTMENT | RECTAL | Status: DC | PRN

## 2016-02-09 MED ORDER — OXCARBAZEPINE 300 MG PO TABS
600.0000 mg | ORAL_TABLET | Freq: Two times a day (BID) | ORAL | Status: DC
  Administered 2016-02-09 – 2016-02-11 (×5): 600 mg via ORAL
  Filled 2016-02-09 (×7): qty 2

## 2016-02-09 MED ORDER — WITCH HAZEL-GLYCERIN EX PADS: 1.0000 "application " | MEDICATED_PAD | CUTANEOUS | Status: DC | PRN

## 2016-02-09 MED ORDER — LACTATED RINGERS IV SOLN
INTRAVENOUS | Status: DC
  Administered 2016-02-09 – 2016-02-10 (×3): via INTRAVENOUS

## 2016-02-09 MED ORDER — SIMETHICONE 80 MG PO CHEW
80.0000 mg | CHEWABLE_TABLET | ORAL | Status: DC | PRN
  Administered 2016-02-10 – 2016-02-11 (×2): 80 mg via ORAL
  Filled 2016-02-09 (×2): qty 1

## 2016-02-09 MED ORDER — HYDRALAZINE HCL 20 MG/ML IJ SOLN: 10.0000 mg | Freq: Once | INTRAMUSCULAR | Status: DC | PRN

## 2016-02-09 MED ORDER — CITALOPRAM HYDROBROMIDE 20 MG PO TABS
20.0000 mg | ORAL_TABLET | Freq: Every day | ORAL | Status: DC
  Administered 2016-02-09 – 2016-02-11 (×3): 20 mg via ORAL
  Filled 2016-02-09 (×4): qty 1

## 2016-02-09 MED ORDER — MAGNESIUM SULFATE 50 % IJ SOLN
2.0000 g/h | INTRAVENOUS | Status: AC
  Administered 2016-02-09 (×2): 2 g/h via INTRAVENOUS
  Filled 2016-02-09 (×2): qty 80

## 2016-02-09 MED ORDER — ONDANSETRON HCL 4 MG PO TABS: 4.0000 mg | ORAL_TABLET | ORAL | Status: DC | PRN

## 2016-02-09 MED ORDER — BENZOCAINE-MENTHOL 20-0.5 % EX AERO
1.0000 | INHALATION_SPRAY | CUTANEOUS | Status: DC | PRN
Start: 2016-02-09 — End: 2016-02-11
  Filled 2016-02-09: qty 56

## 2016-02-09 MED ORDER — SENNOSIDES-DOCUSATE SODIUM 8.6-50 MG PO TABS
2.0000 | ORAL_TABLET | ORAL | Status: DC
  Administered 2016-02-10 (×2): 2 via ORAL
  Filled 2016-02-09 (×2): qty 2

## 2016-02-09 MED ORDER — TETANUS-DIPHTH-ACELL PERTUSSIS 5-2.5-18.5 LF-MCG/0.5 IM SUSP: 0.5000 mL | Freq: Once | INTRAMUSCULAR | Status: DC

## 2016-02-09 MED ORDER — ONDANSETRON HCL 4 MG/2ML IJ SOLN: 4.0000 mg | INTRAMUSCULAR | Status: DC | PRN

## 2016-02-09 MED ORDER — ACETAMINOPHEN 325 MG PO TABS
650.0000 mg | ORAL_TABLET | ORAL | Status: DC | PRN
  Administered 2016-02-09 – 2016-02-11 (×5): 650 mg via ORAL
  Filled 2016-02-09 (×6): qty 2

## 2016-02-09 NOTE — Anesthesia Postprocedure Evaluation (Signed)
Anesthesia Post Note  Patient: Connie [name]  Procedure(s) Performed: * No procedures listed *  Patient location during evaluation: [facility] Anesthesia Type: Epidural Level of consciousness: awake, awake and alert, oriented and patient cooperative Pain management: pain level controlled Vital Signs Assessment: post-procedure vital signs reviewed and stable Respiratory status: spontaneous breathing, nonlabored ventilation and respiratory function stable Cardiovascular status: stable Postop Assessment: patient able to bend at knees, no backache, no headache and no signs of nausea or vomiting Anesthetic complications: no     Last Vitals:  Vitals:   02/09/16 0700 02/09/16 0800  BP: (!) 145/79 140/84  Pulse: (!) 111 (!) 109  Resp: 18 18  Temp: 36.8 C 37.3 C    Last Pain:  Vitals:   02/09/16 0800  TempSrc: Oral  PainSc:    Pain Goal: Patients Stated Pain Goal: 3 (02/09/16 0730)               Mirage Pfefferkorn L

## 2016-02-09 NOTE — Progress Notes (Signed)
LABOR PROGRESS NOTE  Connie [name] is a 25 y.o. G3P1001 at 865w1d  admitted for IOL for pre-eclampsia  Subjective: Pt now developing headache and elevated BP in 160s systolic.   Objective: BP 135/73   Pulse (!) 105   Temp (!) 101 F (38.3 C) (Oral)   Resp 18   Ht 4\' 11"  (1.499 m)   Wt 185 lb (83.9 kg)   LMP 05/18/2015 (Exact Date)   SpO2 99%   BMI 37.37 kg/m  or  Vitals:   02/09/16 0400 02/09/16 0401 02/09/16 0415 02/09/16 0430  BP: (!) 161/142 138/79 (!) 168/69 135/73  Pulse: (!) 174 (!) 111 (!) 103 (!) 105  Resp: 18 18  18   Temp:    (!) 101 F (38.3 C)  TempSrc:    Oral  SpO2:      Weight:      Height:         Dilation: 10 Dilation Complete Date: 02/09/16 Dilation Complete Time: 0300 Effacement (%): 100 Cervical Position: Anterior Station: +1 Presentation: Vertex Exam by:: Rolene Arbouresmaine Lewis, RN  Labs: Lab Results  Component Value Date   WBC 12.7 (H) 02/08/2016   HGB 10.6 (L) 02/08/2016   HCT 31.3 (L) 02/08/2016   MCV 94.0 02/08/2016   PLT 192 02/08/2016    Patient Active Problem List   Diagnosis Date Noted  . Normal delivery 02/09/2016  . Gestational hypertension w/o significant proteinuria in 3rd trimester 01/29/2016  . Placental abruption in third trimester 01/20/2016  . Marijuana use 09/13/2015  . Smoker 09/13/2015  . Supervision of other high-risk pregnancy 07/14/2015  . Nausea 07/14/2015  . History of cocaine use 06/30/2014  . Severe episode of recurrent major depressive disorder, without psychotic features (HCC) 06/26/2014  . Obesity 06/17/2013  . Hx of preeclampsia, prior pregnancy, currently pregnant 07/12/2012  . 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 Bipolar affective disorder (HCC) 07/02/2012  . Drug abuse 07/02/2012    Assessment / Plan: 25 y.o. G3P1001 at 2365w1d here for IOL for pre-eclampia without severe features  Post delivery pt developed severe features will start on magnesium. For 24 hours. labetolol protocol ordered.     Ernestina PennaNicholas Shloima Clinch, MD 02/09/2016, 4:38 AM

## 2016-02-09 NOTE — Consult Note (Signed)
Neonatology Note:  Attendance at Code Apgar:   Our team responded to a Code Apgar call to room # 172 following NSVD, due to infant with apnea. The requesting physician was Dr. Anyawu/Schenk. The mother is a 24 y.o. G3P1001 at [redacted]w[redacted]d here for IOL for mild pre-eclampsia and chronic abruption. GBS neg. ROM occurred 4 hours PTD and the fluid was clear.  At delivery, the baby had poor respiratory effort. The OB nursing staff in attendance gave vigorous stimulation and a Code Apgar was called. Our team arrived at ~2 minutes of life, at which time the baby was improving with spontaneous breathing and improving tone.  Sao2 placed and in 60s.  Blow by oxygen given for 1-2 minutes.  Lungs with good aeration.  Effort and cry improved.  O2 removed and infant remained stable in the mid 90s with regular respirations and effort.  Apgar xxx/8.  I spoke with the parents in the DR, then transferred the baby to the Pediatrician's care.   Connie Beal [name], MD 

## 2016-02-09 NOTE — Progress Notes (Signed)
D/C'd foley. Pt oob and up to bathroom. Pt voided 100cc since foley removal. Peri care performed. Carmelina DaneERRI L Neville Walston, RN

## 2016-02-09 NOTE — Progress Notes (Signed)
Connie [name] is a 25 y.o. G3P1001 at 5022w1d here for IOL for mild pre-e and chronic abruption.  Subjective: Pt states that her primary concern right now is heartburn.   Objective: BP 130/72   Pulse (!) 101   Temp 99.5 F (37.5 C) (Oral)   Resp 18   Ht 4\' 11"  (1.499 m)   Wt 185 lb (83.9 kg)   LMP 05/18/2015 (Exact Date)   SpO2 99%   BMI 37.37 kg/m  No intake/output data recorded. No intake/output data recorded.  FHT:   Fetal Heart Rate A  Mode External filed at 02/09/2016 0230  Baseline Rate (A) 140 bpm filed at 02/09/2016 0230  Variability 6-25 BPM filed at 02/09/2016 0230  Accelerations 15 x 15 filed at 02/09/2016 0230  Decelerations Late filed at 02/09/2016 0230   UC:   regular, every 2.5-3.5 minutes SVE:   Dilation: 7 Effacement (%): 80 Station: -2 Exam by:: Rolene Arbouresmaine Lewis, RN  Labs: Lab Results  Component Value Date   WBC 12.7 (H) 02/08/2016   HGB 10.6 (L) 02/08/2016   HCT 31.3 (L) 02/08/2016   MCV 94.0 02/08/2016   PLT 192 02/08/2016    Assessment / Plan: Connie [name] is a G3P1001 at 3222w1d here for IOL for mild pre-e and chronic abruption.  Heartburn: - Pepcid IVPB 20 mg premix Labor: Progressing on pitocin Preeclampsia:  no signs or symptoms of toxicity Fetal Wellbeing:  Category I Pain Control:  Epidural I/D:  GBS neg Anticipated MOD:  NSVD  Andres Egericia Xaidyn Kepner, MD, PGY-1, MPH 02/09/2016, 1:45 AM

## 2016-02-10 ENCOUNTER — Inpatient Hospital Stay (HOSPITAL_COMMUNITY): Payer: Medicare Other | Admitting: Certified Registered Nurse Anesthetist

## 2016-02-10 ENCOUNTER — Encounter (HOSPITAL_COMMUNITY)
Admission: RE | Disposition: A | Discharge status: Home / Self Care | Payer: Self-pay | Source: Ambulatory Visit | Attending: Obstetrics & Gynecology

## 2016-02-10 ENCOUNTER — Encounter (HOSPITAL_COMMUNITY): Payer: Self-pay

## 2016-02-10 DIAGNOSIS — Z302 Encounter for sterilization: Secondary | ICD-10-CM

## 2016-02-10 HISTORY — PX: TUBAL LIGATION: SHX77

## 2016-02-10 LAB — MRSA PCR SCREENING: MRSA by PCR: NEGATIVE

## 2016-02-10 SURGERY — LIGATION, FALLOPIAN TUBE, POSTPARTUM
Anesthesia: Epidural | Site: Abdomen

## 2016-02-10 MED ORDER — NALBUPHINE HCL 10 MG/ML IJ SOLN: 5.0000 mg | INTRAMUSCULAR | Status: DC | PRN

## 2016-02-10 MED ORDER — NALBUPHINE HCL 10 MG/ML IJ SOLN: 5.0000 mg | Freq: Once | INTRAMUSCULAR | Status: DC | PRN

## 2016-02-10 MED ORDER — FENTANYL CITRATE (PF) 100 MCG/2ML IJ SOLN
INTRAMUSCULAR | Status: DC | PRN
  Administered 2016-02-10: 50 ug via INTRAVENOUS
  Administered 2016-02-10: 100 ug via EPIDURAL

## 2016-02-10 MED ORDER — KETOROLAC TROMETHAMINE 30 MG/ML IJ SOLN: 30.0000 mg | Freq: Four times a day (QID) | INTRAMUSCULAR | Status: AC | PRN

## 2016-02-10 MED ORDER — DIPHENHYDRAMINE HCL 25 MG PO CAPS: 25.0000 mg | ORAL_CAPSULE | ORAL | Status: DC | PRN

## 2016-02-10 MED ORDER — BUPIVACAINE HCL (PF) 0.5 % IJ SOLN
INTRAMUSCULAR | Status: AC
Start: 2016-02-10 — End: 2016-02-10
  Filled 2016-02-10: qty 30

## 2016-02-10 MED ORDER — IBUPROFEN 600 MG PO TABS: 600.0000 mg | ORAL_TABLET | Freq: Four times a day (QID) | ORAL | Status: DC | PRN

## 2016-02-10 MED ORDER — KETOROLAC TROMETHAMINE 30 MG/ML IJ SOLN
INTRAMUSCULAR | Status: AC
  Filled 2016-02-10: qty 1

## 2016-02-10 MED ORDER — METOCLOPRAMIDE HCL 5 MG/ML IJ SOLN
INTRAMUSCULAR | Status: AC
  Filled 2016-02-10: qty 2

## 2016-02-10 MED ORDER — NALOXONE HCL 2 MG/2ML IJ SOSY
1.0000 ug/kg/h | PREFILLED_SYRINGE | INTRAVENOUS | Status: DC | PRN
  Filled 2016-02-10: qty 2

## 2016-02-10 MED ORDER — SCOPOLAMINE 1 MG/3DAYS TD PT72
1.0000 | MEDICATED_PATCH | Freq: Once | TRANSDERMAL | Status: DC
  Administered 2016-02-10: 1.5 mg via TRANSDERMAL

## 2016-02-10 MED ORDER — PROPOFOL 10 MG/ML IV BOLUS
INTRAVENOUS | Status: DC | PRN
  Administered 2016-02-10: 20 mg via INTRAVENOUS
  Administered 2016-02-10: 10 mg via INTRAVENOUS
  Administered 2016-02-10: 20 mg via INTRAVENOUS

## 2016-02-10 MED ORDER — SODIUM CHLORIDE 0.9% FLUSH: 3.0000 mL | INTRAVENOUS | Status: DC | PRN

## 2016-02-10 MED ORDER — METOCLOPRAMIDE HCL 5 MG/ML IJ SOLN
INTRAMUSCULAR | Status: DC | PRN
  Administered 2016-02-10: 10 mg via INTRAVENOUS

## 2016-02-10 MED ORDER — FENTANYL CITRATE (PF) 100 MCG/2ML IJ SOLN
INTRAMUSCULAR | Status: AC
  Filled 2016-02-10: qty 2

## 2016-02-10 MED ORDER — MIDAZOLAM HCL 2 MG/2ML IJ SOLN
INTRAMUSCULAR | Status: AC
  Filled 2016-02-10: qty 2

## 2016-02-10 MED ORDER — NALOXONE HCL 0.4 MG/ML IJ SOLN: 0.4000 mg | INTRAMUSCULAR | Status: DC | PRN

## 2016-02-10 MED ORDER — MEPERIDINE HCL 25 MG/ML IJ SOLN: 6.2500 mg | INTRAMUSCULAR | Status: DC | PRN

## 2016-02-10 MED ORDER — KETOROLAC TROMETHAMINE 30 MG/ML IJ SOLN
30.0000 mg | Freq: Four times a day (QID) | INTRAMUSCULAR | Status: AC | PRN
  Administered 2016-02-10: 30 mg via INTRAVENOUS

## 2016-02-10 MED ORDER — SODIUM BICARBONATE 8.4 % IV SOLN
INTRAVENOUS | Status: AC
  Filled 2016-02-10: qty 50

## 2016-02-10 MED ORDER — DIPHENHYDRAMINE HCL 50 MG/ML IJ SOLN: 12.5000 mg | INTRAMUSCULAR | Status: DC | PRN

## 2016-02-10 MED ORDER — SCOPOLAMINE 1 MG/3DAYS TD PT72
MEDICATED_PATCH | TRANSDERMAL | Status: AC
  Administered 2016-02-10: 12:00:00
  Filled 2016-02-10: qty 1

## 2016-02-10 MED ORDER — MIDAZOLAM HCL 2 MG/2ML IJ SOLN
INTRAMUSCULAR | Status: DC | PRN
  Administered 2016-02-10: 2 mg via INTRAVENOUS

## 2016-02-10 MED ORDER — ONDANSETRON HCL 4 MG/2ML IJ SOLN
INTRAMUSCULAR | Status: DC | PRN
  Administered 2016-02-10: 4 mg via INTRAVENOUS

## 2016-02-10 MED ORDER — SODIUM BICARBONATE 8.4 % IV SOLN
INTRAVENOUS | Status: DC | PRN
  Administered 2016-02-10 (×3): 5 mL via EPIDURAL
  Administered 2016-02-10: 3 mL via EPIDURAL

## 2016-02-10 MED ORDER — ACETAMINOPHEN 500 MG PO TABS
1000.0000 mg | ORAL_TABLET | Freq: Four times a day (QID) | ORAL | Status: AC
  Administered 2016-02-10 (×3): 1000 mg via ORAL
  Filled 2016-02-10 (×4): qty 2

## 2016-02-10 MED ORDER — BUPIVACAINE HCL 0.5 % IJ SOLN
INTRAMUSCULAR | Status: DC | PRN
  Administered 2016-02-10: 10 mL
  Administered 2016-02-10: 20 mL

## 2016-02-10 MED ORDER — LIDOCAINE-EPINEPHRINE (PF) 2 %-1:200000 IJ SOLN
INTRAMUSCULAR | Status: AC
  Filled 2016-02-10: qty 20

## 2016-02-10 MED ORDER — ONDANSETRON HCL 4 MG/2ML IJ SOLN: 4.0000 mg | Freq: Three times a day (TID) | INTRAMUSCULAR | Status: DC | PRN

## 2016-02-10 MED ORDER — PROPOFOL 10 MG/ML IV BOLUS
INTRAVENOUS | Status: AC
  Filled 2016-02-10: qty 20

## 2016-02-10 MED ORDER — ONDANSETRON HCL 4 MG/2ML IJ SOLN
INTRAMUSCULAR | Status: AC
  Filled 2016-02-10: qty 2

## 2016-02-10 SURGICAL SUPPLY — 22 items
BENZOIN TINCTURE PRP APPL 2/3 (GAUZE/BANDAGES/DRESSINGS) ×2 IMPLANT
CATH ROBINSON RED A/P 16FR (CATHETERS) ×2 IMPLANT
CLIP FILSHIE TUBAL LIGA STRL (Clip) ×4 IMPLANT
CLOTH BEACON ORANGE TIMEOUT ST (SAFETY) ×2 IMPLANT
DRSG OPSITE POSTOP 3X4 (GAUZE/BANDAGES/DRESSINGS) ×2 IMPLANT
DURAPREP 26ML APPLICATOR (WOUND CARE) ×2 IMPLANT
GLOVE BIOGEL PI IND STRL 7.0 (GLOVE) ×3 IMPLANT
GLOVE BIOGEL PI INDICATOR 7.0 (GLOVE) ×3
GLOVE ECLIPSE 7.0 STRL STRAW (GLOVE) ×2 IMPLANT
GOWN STRL REUS W/TWL LRG LVL3 (GOWN DISPOSABLE) ×4 IMPLANT
GOWN STRL REUS W/TWL XL LVL3 (GOWN DISPOSABLE) ×2 IMPLANT
NEEDLE HYPO 22GX1.5 SAFETY (NEEDLE) ×4 IMPLANT
NS IRRIG 1000ML POUR BTL (IV SOLUTION) ×2 IMPLANT
PACK ABDOMINAL MINOR (CUSTOM PROCEDURE TRAY) ×2 IMPLANT
PROTECTOR NERVE ULNAR (MISCELLANEOUS) ×4 IMPLANT
STRIP CLOSURE SKIN 1/4X3 (GAUZE/BANDAGES/DRESSINGS) ×2 IMPLANT
SUT VIC AB 0 CT1 27 (SUTURE) ×1
SUT VIC AB 0 CT1 27XBRD ANBCTR (SUTURE) ×1 IMPLANT
SUT VIC AB 4-0 PS2 27 (SUTURE) ×2 IMPLANT
SYR CONTROL 10ML LL (SYRINGE) ×2 IMPLANT
TOWEL OR 17X24 6PK STRL BLUE (TOWEL DISPOSABLE) ×4 IMPLANT
WATER STERILE IRR 1000ML POUR (IV SOLUTION) IMPLANT

## 2016-02-10 NOTE — Progress Notes (Signed)
Pt mag was d/c. Pt denies any complains of headache, dizziness,blurr vision. Etc. V/S within normal limits. Will continue to monitor pt closely.

## 2016-02-10 NOTE — Progress Notes (Signed)
Patient updated and education, as well as Grandmother, on keeping baby on baby and no additional big fluffy blankets in room .

## 2016-02-10 NOTE — Anesthesia Postprocedure Evaluation (Signed)
Anesthesia Post Note  Patient: Connie [name]  Procedure(s) Performed: Procedure(s) (LRB): POST PARTUM TUBAL LIGATION (N/A)  Patient location during evaluation: PACU Anesthesia Type: Epidural Level of consciousness: awake and alert Pain management: pain level controlled Vital Signs Assessment: post-procedure vital signs reviewed and stable Respiratory status: spontaneous breathing, nonlabored ventilation and respiratory function stable Cardiovascular status: stable Postop Assessment: no headache, no backache and epidural receding Anesthetic complications: no     Last Vitals:  Vitals:   02/10/16 1145 02/10/16 1200  BP: 137/75 125/75  Pulse:  90  Resp:  (!) 21  Temp: 36.8 C     Last Pain:  Vitals:   02/10/16 0830  TempSrc:   PainSc: 0-No pain   Pain Goal: Patients Stated Pain Goal: 3 (02/10/16 0013)               Letcher Schweikert A

## 2016-02-10 NOTE — Progress Notes (Signed)
Post Partum Day 1 Subjective: no complaints, up ad lib and voiding  Objective: Blood pressure 140/80, pulse 85, temperature 98.4 F (36.9 C), temperature source Oral, resp. rate 18, height 4\' 11"  (1.499 m), weight 176 lb 4 oz (79.9 kg), last menstrual period 05/18/2015, SpO2 99 %, unknown if currently breastfeeding.  Physical Exam:  General: alert, cooperative and appears stated age 45Lochia: appropriate Uterine Fundus: firm DVT Evaluation: No evidence of DVT seen on physical exam.   Recent Labs  02/08/16 2120 02/09/16 0652  HGB 10.6* 10.5*  HCT 31.3* 31.2*    Assessment/Plan: Plan for discharge tomorrow and Contraception for BTL today  Bottle feeding Risks include but are not limited to bleeding, infection, injury to surrounding structures, including bowel, bladder and ureters, blood clots, and death.  Likelihood of success is high.    LOS: 2 days   Ashante Snelling S 02/10/2016, 7:59 AM

## 2016-02-10 NOTE — Transfer of Care (Signed)
Immediate Anesthesia Transfer of Care Note  Patient: Connie [name]  Procedure(s) Performed: Procedure(s): POST PARTUM TUBAL LIGATION (N/A)  Patient Location: PACU  Anesthesia Type:Epidural  Level of Consciousness: awake, alert , oriented and patient cooperative  Airway & Oxygen Therapy: Patient Spontanous Breathing and Patient connected to nasal cannula oxygen  Post-op Assessment: Report given to RN and Post -op Vital signs reviewed and stable  Post vital signs: Reviewed and stable  Last Vitals:  Vitals:   02/10/16 0300 02/10/16 0430  BP:  140/80  Pulse:  85  Resp: 18 18  Temp:  36.9 C    Last Pain:  Vitals:   02/10/16 0830  TempSrc:   PainSc: 0-No pain      Patients Stated Pain Goal: 3 (02/10/16 0013)  Complications: No apparent anesthesia complications

## 2016-02-10 NOTE — Anesthesia Preprocedure Evaluation (Signed)
Anesthesia Evaluation  Patient identified by MRN, date of birth, ID band Patient awake    Reviewed: Allergy & Precautions, NPO status , Patient's Chart, lab work & pertinent test results  Airway Mallampati: I  TM Distance: >3 FB Neck ROM: Full    Dental  (+) Teeth Intact, Dental Advisory Given   Pulmonary Current Smoker,    breath sounds clear to auscultation       Cardiovascular hypertension (No meds),  Rhythm:Regular Rate:Normal     Neuro/Psych    GI/Hepatic GERD  ,  Endo/Other    Renal/GU      Musculoskeletal   Abdominal   Peds  Hematology   Anesthesia Other Findings Nose pierced Delivered 02/09/16  Reproductive/Obstetrics                             Anesthesia Physical Anesthesia Plan  ASA: II  Anesthesia Plan: Epidural   Post-op Pain Management:    Induction:   Airway Management Planned:   Additional Equipment:   Intra-op Plan:   Post-operative Plan:   Informed Consent: I have reviewed the patients History and Physical, chart, labs and discussed the procedure including the risks, benefits and alternatives for the proposed anesthesia with the patient or authorized representative who has indicated his/her understanding and acceptance.     Plan Discussed with: CRNA, Anesthesiologist and Surgeon  Anesthesia Plan Comments:         Anesthesia Quick Evaluation

## 2016-02-10 NOTE — Op Note (Signed)
Connie [name] 02/08/2016 - 02/10/2016  PREOPERATIVE DIAGNOSIS:  Multiparity, undesired fertility  POSTOPERATIVE DIAGNOSIS:  Multiparity, undesired fertility  PROCEDURE:  Postpartum Bilateral Tubal Sterilization using Filshie Clips   ANESTHESIA:  Epidural and local analgesia using 0.5% Marcaine  COMPLICATIONS:  None immediate.  ESTIMATED BLOOD LOSS: 5 ml.  FLUIDS: 1 liter ml LR.  URINE OUTPUT:  Clear urine  INDICATIONS: 25 y.o. [id number]  with undesired fertility,status post vaginal delivery, desires permanent sterilization.  Other reversible forms of contraception were discussed with patient; she declines all other modalities. Risks of procedure discussed with patient including but not limited to: risk of regret, permanence of method, bleeding, infection, injury to surrounding organs and need for additional procedures.  Failure risk of 0.5-1% with increased risk of ectopic gestation if pregnancy occurs was also discussed with patient.     FINDINGS:  Normal uterus, tubes, and ovaries.  PROCEDURE DETAILS: The patient was taken to the operating room where her epidural anesthesia was dosed up to surgical level and found to be adequate.  She was then placed in the dorsal supine position and prepped and draped in sterile fashion.  After an adequate timeout was performed, attention was turned to the patient's abdomen where a small transverse skin incision was made under the umbilical fold. The incision was taken down to the layer of fascia using the scalpel, and fascia was incised, and extended bilaterally using Mayo scissors. The peritoneum was entered in a sharp fashion. Attention was then turned to the patient's uterus, and left fallopian tube was identified and followed out to the fimbriated end.  A Filshie clip was placed on the left fallopian tube about 3 cm from the cornual attachment, with care given to incorporate the underlying mesosalpinx.  A similar process was carried out on the right  side allowing for bilateral tubal sterilization.  Good hemostasis was noted overall.  Local analgesia was injected into both Filshie application sites.The instruments were then removed from the patient's abdomen and the fascial incision was repaired with 0 Vicryl, and the skin was closed with a 4-0 Vicryl subcuticular stitch. 20cc of 0.5% Marcaine was injected into the incision. The patient tolerated the procedure well.  Instrument, sponge, and needle counts were correct times two.  The patient was then taken to the recovery room awake and in stable condition.  There were no known complications.    Raynor Calcaterra L. Harraway-Smith, M.D., Evern CoreFACOG

## 2016-02-11 MED ORDER — HYDROCHLOROTHIAZIDE 25 MG PO TABS: 25.0000 mg | ORAL_TABLET | Freq: Every day | ORAL | 3 refills | Status: DC

## 2016-02-11 MED ORDER — HYDROCHLOROTHIAZIDE 25 MG PO TABS: 25.0000 mg | ORAL_TABLET | Freq: Every day | ORAL | 1 refills | Status: DC

## 2016-02-11 MED ORDER — HYDROCHLOROTHIAZIDE 25 MG PO TABS
25.0000 mg | ORAL_TABLET | Freq: Every day | ORAL | Status: DC
  Administered 2016-02-11: 25 mg via ORAL
  Filled 2016-02-11: qty 1

## 2016-02-11 MED ORDER — IBUPROFEN 600 MG PO TABS: 600.0000 mg | ORAL_TABLET | Freq: Four times a day (QID) | ORAL | 0 refills | Status: DC

## 2016-02-11 NOTE — Discharge Summary (Signed)
OB Discharge Summary     Patient Name: Connie [name] DOB: 05-Aug-1990 MRN: [id number]  Date of admiEdwena Blowssion: 02/08/2016 Delivering MD: Ernestina PennaSCHENK, NICHOLAS MICHAEL   Date of discharge: 02/11/2016  Admitting diagnosis: INDUCTION Desires Sterilization Intrauterine pregnancy: 6950w1d     Secondary diagnosis:  Active Problems:   Placental abruption in third trimester   Normal delivery   Encounter for sterilization Preeclampsia with severe features   Discharge diagnosis: Term Pregnancy Delivered and sterilization                                                                                                Post partum procedures:postpartum tubal ligation  Complications: None  Hospital course:  Induction of Labor With Vaginal Delivery   25 y.o. yo G3P2002 at 1150w1d was admitted to the hospital 02/08/2016 for induction of labor.  Indication for induction: Preeclampsia.  Patient had an uncomplicated labor course as follows: Membrane Rupture Time/Date: 11:34 PM ,02/08/2016   Intrapartum Procedures: Episiotomy: None [1]                                         Lacerations:  None [1]  Patient had delivery of a Viable infant.  Information for the patient's newborn:  Ricka BurdockGlidewell, Girl Larua [[id number]][[id number]]  Delivery Method: Vag-Spont   02/09/2016  Details of delivery can be found in separate delivery note.  Patient had a routine postpartum course. Patient is discharged home 02/11/16.   Physical exam Vitals:   02/10/16 1820 02/10/16 2106 02/11/16 0157 02/11/16 0542  BP: 133/66 (!) 143/75 134/72 (!) 150/71  Pulse: 73 75 76 87  Resp: 18 18 20 18   Temp: 98.7 F (37.1 C) 98.9 F (37.2 C) 98.1 F (36.7 C) 98.3 F (36.8 C)  TempSrc: Oral Oral Oral Oral  SpO2: 100% 99% 97% 98%  Weight:      Height:       General: alert Lochia: appropriate Uterine Fundus: firm Incision: Healing well with no significant drainage, Dressing is clean, dry, and intact DVT Evaluation: No evidence of DVT seen on  physical exam. Labs: Lab Results  Component Value Date   WBC 14.4 (H) 02/09/2016   HGB 10.5 (L) 02/09/2016   HCT 31.2 (L) 02/09/2016   MCV 90.7 02/09/2016   PLT 194 02/09/2016   CMP Latest Ref Rng & Units 02/09/2016  Glucose 65 - 99 mg/dL 97  BUN 6 - 20 mg/dL <7(W<5(L)  Creatinine 2.950.44 - 1.00 mg/dL 6.210.51  Sodium 308135 - 657145 mmol/L 134(L)  Potassium 3.5 - 5.1 mmol/L 3.1(L)  Chloride 101 - 111 mmol/L 105  CO2 22 - 32 mmol/L 20(L)  Calcium 8.9 - 10.3 mg/dL 8.0(L)  Total Protein 6.5 - 8.1 g/dL 5.1(L)  Total Bilirubin 0.3 - 1.2 mg/dL 0.8  Alkaline Phos 38 - 126 U/L 75  AST 15 - 41 U/L 24  ALT 14 - 54 U/L 9(L)    Discharge instruction: per After Visit Summary and "Baby and Me Booklet".  After visit meds:  Medication List    STOP taking these medications   acetaminophen 325 MG tablet Commonly known as:  TYLENOL   promethazine 25 MG tablet Commonly known as:  PHENERGAN     TAKE these medications   aspirin 81 MG tablet Take 81 mg by mouth daily.   citalopram 20 MG tablet Commonly known as:  CELEXA Take 20 mg by mouth daily.   hydrochlorothiazide 25 MG tablet Commonly known as:  HYDRODIURIL Take 1 tablet (25 mg total) by mouth daily.   hydrochlorothiazide 25 MG tablet Commonly known as:  HYDRODIURIL Take 1 tablet (25 mg total) by mouth daily.   ibuprofen 600 MG tablet Commonly known as:  ADVIL,MOTRIN Take 1 tablet (600 mg total) by mouth every 6 (six) hours.   omeprazole 20 MG capsule Commonly known as:  PRILOSEC Take 20 mg by mouth daily.   oxcarbazepine 600 MG tablet Commonly known as:  TRILEPTAL Take 600 mg by mouth 2 (two) times daily.   prenatal vitamin w/FE, FA 27-1 MG Tabs tablet Take 1 tablet by mouth daily at 12 noon.      Diet: low salt diet  Activity: Advance as tolerated. Pelvic rest for 6 weeks.   Outpatient follow up:2 weeks Follow up Appt:No future appointments. Follow up Visit:No Follow-up on file.  Postpartum contraception: Tubal  Ligation  Newborn Data: Live born female  Birth Weight: 6 lb 1 oz (2750 g) APGAR: 6, 8  Baby Feeding: Bottle Disposition:home with mother   02/11/2016 Willodean Rosenthal, MD

## 2016-02-11 NOTE — Progress Notes (Signed)
Discharge instructions complete. Baby placed in care seat and sent to nursery for HUGS tag removal. Patient discharged home with family and ambulatory.

## 2016-02-11 NOTE — Progress Notes (Signed)
Report given to House Sup.

## 2016-02-11 NOTE — Discharge Instructions (Signed)
Postpartum Care After Vaginal Delivery °After you deliver your newborn (postpartum period), the usual stay in the hospital is 24-72 hours. If there were problems with your labor or delivery, or if you have other medical problems, you might be in the hospital longer.  °While you are in the hospital, you will receive help and instructions on how to care for yourself and your newborn during the postpartum period.  °While you are in the hospital: °· Be sure to tell your nurses if you have pain or discomfort, as well as where you feel the pain and what makes the pain worse. °· If you had an incision made near your vagina (episiotomy) or if you had some tearing during delivery, the nurses may put ice packs on your episiotomy or tear. The ice packs may help to reduce the pain and swelling. °· If you are breastfeeding, you may feel uncomfortable contractions of your uterus for a couple of weeks. This is normal. The contractions help your uterus get back to normal size. °· It is normal to have some bleeding after delivery. °· For the first 1-3 days after delivery, the flow is red and the amount may be similar to a period. °· It is common for the flow to start and stop. °· In the first few days, you may pass some small clots. Let your nurses know if you begin to pass large clots or your flow increases. °· Do not  flush blood clots down the toilet before having the nurse look at them. °· During the next 3-10 days after delivery, your flow should become more watery and pink or brown-tinged in color. °· Ten to fourteen days after delivery, your flow should be a small amount of yellowish-white discharge. °· The amount of your flow will decrease over the first few weeks after delivery. Your flow may stop in 6-8 weeks. Most women have had their flow stop by 12 weeks after delivery. °· You should change your sanitary pads frequently. °· Wash your hands thoroughly with soap and water for at least 20 seconds after changing pads, using  the toilet, or before holding or feeding your newborn. °· You should feel like you need to empty your bladder within the first 6-8 hours after delivery. °· In case you become weak, lightheaded, or faint, call your nurse before you get out of bed for the first time and before you take a shower for the first time. °· Within the first few days after delivery, your breasts may begin to feel tender and full. This is called engorgement. Breast tenderness usually goes away within 48-72 hours after engorgement occurs. You may also notice milk leaking from your breasts. If you are not breastfeeding, do not stimulate your breasts. Breast stimulation can make your breasts produce more milk. °· Spending as much time as possible with your newborn is very important. During this time, you and your newborn can feel close and get to know each other. Having your newborn stay in your room (rooming in) will help to strengthen the bond with your newborn.  It will give you time to get to know your newborn and become comfortable caring for your newborn. °· Your hormones change after delivery. Sometimes the hormone changes can temporarily cause you to feel sad or tearful. These feelings should not last more than a few days. If these feelings last longer than that, you should talk to your caregiver. °· If desired, talk to your caregiver about methods of family planning or contraception. °·   Talk to your caregiver about immunizations. Your caregiver may want you to have the following immunizations before leaving the hospital: °· Tetanus, diphtheria, and pertussis (Tdap) or tetanus and diphtheria (Td) immunization. It is very important that you and your family (including grandparents) or others caring for your newborn are up-to-date with the Tdap or Td immunizations. The Tdap or Td immunization can help protect your newborn from getting ill. °· Rubella immunization. °· Varicella (chickenpox) immunization. °· Influenza immunization. You should  receive this annual immunization if you did not receive the immunization during your pregnancy. °  °This information is not intended to replace advice given to you by your health care provider. Make sure you discuss any questions you have with your health care provider. °  °Document Released: 04/14/2007 Document Revised: 03/11/2012 Document Reviewed: 02/12/2012 °Elsevier Interactive Patient Education ©2016 Elsevier Inc. ° °Postpartum Tubal Ligation, Care After °Refer to this sheet in the next few weeks. These instructions provide you with information about caring for yourself after your procedure. Your health care provider may also give you more specific instructions. Your treatment has been planned according to current medical practices, but problems sometimes occur. Call your health care provider if you have any problems or questions after your procedure. °WHAT TO EXPECT AFTER THE PROCEDURE °After your procedure, it is common to have: °· Sore throat. °· Soreness at the incision site. °· Mild cramping. °· Tiredness. °· Mild nausea or vomiting. °HOME CARE INSTRUCTIONS °· Rest for the remainder of the day. °· Take medicines only as directed by your health care provider. These include over-the-counter medicines and prescription medicines. Do not take aspirin, which can cause bleeding. °· Over the next few days, gradually return to your normal activities and your normal diet. °· Avoid sexual intercourse for 2 weeks or as directed by your health care provider. °· Do not drive or operate heavy machinery while taking pain medicine. °· Do not lift anything that is heavier than 5 lb (2.3 kg) for 2 weeks or as directed by your health care provider. °· Do not take baths. Take showers only. Ask your health care provider when you can start taking baths. °· Take your temperature twice each day and write it down. °· Try to have help for the first 7-10 days for your household needs. °· There are many different ways to close and  cover an incision, including stitches (sutures), skin glue, and adhesive strips. Follow instructions from your health care provider about: °¨ Incision care. °¨ Bandage (dressing) changes and removal. °¨ Incision closure removal. °· Check your incision area every day for signs of infection. Watch for: °¨ Redness, swelling, or pain. °¨ Fluid, blood, or pus. °· Keep all follow-up visits as directed by your health care provider. °SEEK MEDICAL CARE IF: °· You have redness, swelling, or increasing pain in your incision area. °· You have fluid or pus coming from your incision for longer than 1 day. °· You notice a bad smell coming from your incision or your dressing. °· The edges of your incision break open after the sutures have been removed. °· Your pain does not decrease after 2-3 days. °· You have a rash. °· You repeatedly become dizzy or light-headed. °· You have a reaction to your medicine. °· Your pain medicine is not helping. °· You are constipated. °SEEK IMMEDIATE MEDICAL CARE IF:  °· You have a fever. °· You faint. °· You have increasing pain in your abdomen. °· You have bleeding or drainage from your   suture sites or your vagina after surgery. °· You have shortness of breath or have difficulty breathing. °· You have chest pain or leg pain. °· You have ongoing nausea, vomiting, or diarrhea. °  °This information is not intended to replace advice given to you by your health care provider. Make sure you discuss any questions you have with your health care provider. °  °Document Released: 12/17/2011 Document Revised: 11/01/2014 Document Reviewed: 12/17/2011 °Elsevier Interactive Patient Education ©2016 Elsevier Inc. ° ° °

## 2016-02-11 NOTE — Clinical Social Work Maternal (Signed)
  CLINICAL SOCIAL WORK MATERNAL/CHILD NOTE  Patient Details  Name: Connie [name] MRN: [id number] Date of Birth: Jun 13, 1991  Date:  02/11/2016  Clinical Social Worker Initiating Note:  Rigoberto Noel, LCSW Date/ Time Initiated:  02/11/16/1115     Child's Name:  [name]   Legal Guardian:  Mother   Need for Interpreter:  None   Date of Referral:  02/09/16     Reason for Referral:  [place], including SI    Referral Source:  Physician   Address:  [address] [place]   Phone number:  [phone]   Household Members:  Self, Parents   Natural Supports (not living in the home):  Neighbors, Friends   Professional Supports: Other (Comment) Heritage manager)   Employment: Other (comment) (Disabled)   Type of Work: NA   Education:  9 to 11 years   Museum/gallery curator Resources:  Medicaid, SSI/Disability   Other Resources:  [facility]   Cultural/Religious Considerations Which May Impact Care:  none reported  Strengths:  Ability to meet basic needs , Engineer, materials , Home prepared for child    Risk Factors/Current Problems:  Mental Health Concerns , Basic Needs    Cognitive State:  Alert    Mood/Affect:  Calm    CSW Assessment: CSW met with [name] to complete assessment. CSW explained hospital's policy to complete drug screening for baby due to mental health hx and substance abuse hx. [name] acknowledged understanding. [name] reported she receives income through disability, she has supportive family (lives with mother), basic needs for baby and current with psychiatrist. [name] reported she is current with Dr. Letitia Caul at [facility] in [place] for medication management. [name] reported her last visit was early in her pregnancy. [name] reported she sees him every 3 months and she is prescribed Celexa, Triliptal and Trazodone, although she reports she did not take Trazadone during her pregnancy. 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 [name] reported she used some marijuana use early in her pregnancy. She reported she has not used since finding out about pregnancy. [name] was informed that if baby's results were positive for any substances CPS would be in contact. [name] verbalized understanding. [name] declined referral to any community resources. [name] stated that she has pediatrician appointment the following day.  CSW Plan/Description:  No Further Intervention Required/No Barriers to Discharge    Essie Christine, LCSW 02/11/2016, 11:48 AM

## 2016-02-12 ENCOUNTER — Encounter (HOSPITAL_COMMUNITY): Payer: Self-pay | Admitting: Obstetrics & Gynecology

## 2016-02-16 ENCOUNTER — Ambulatory Visit: Payer: Medicare Other | Admitting: Obstetrics & Gynecology

## 2016-03-11 ENCOUNTER — Ambulatory Visit (INDEPENDENT_AMBULATORY_CARE_PROVIDER_SITE_OTHER): Payer: Medicare Other | Admitting: Women's Health

## 2016-03-11 ENCOUNTER — Encounter: Payer: Self-pay | Admitting: Women's Health

## 2016-03-11 DIAGNOSIS — O165 Unspecified maternal hypertension, complicating the puerperium: Secondary | ICD-10-CM

## 2016-03-11 DIAGNOSIS — Z8759 Personal history of other complications of pregnancy, childbirth and the puerperium: Secondary | ICD-10-CM | POA: Insufficient documentation

## 2016-03-11 NOTE — Progress Notes (Signed)
Subjective:    Connie [name] is a 25 y.o. 423P2002 Caucasian female who presents for a postpartum visit. She is 4 weeks postpartum following a spontaneous vaginal delivery at 37.1 gestational weeks after IOL for pre-e. Anesthesia: epidural. I have fully reviewed the prenatal and intrapartum course. Also had BTS in-hospital. D/C'd on HCTZ 25mg  daily, still taking as directed. Postpartum course has been uncomplicated. Baby's course has been uncomplicated. Baby is feeding by bottle. Bleeding no bleeding. Bowel function is normal. Bladder function is stress urinary incontinence. Patient is sexually active. Last sexual activity: 9/9, had sex ~2wks after delivery for the first time. Contraception method is tubal ligation. Postpartum depression screening: negative. Score 4.  Taking Celexa 20mg  daily and Trileptal for Bipolar disorder. Last pap 07/2015 and was neg.  The following portions of the patient's history were reviewed and updated as appropriate: allergies, current medications, past medical history, past surgical history and problem list.  Review of Systems Pertinent items are noted in HPI.   Vitals:   03/11/16 1443  BP: 138/88  Pulse: 80  Weight: 168 lb (76.2 kg)   No LMP recorded.  Objective:   General:  alert, cooperative and no distress   Breasts:  deferred, no complaints  Lungs: clear to auscultation bilaterally  Heart:  regular rate and rhythm  Abdomen: soft, nontender   Vulva: normal  Vagina: normal vagina  Cervix:  closed  Corpus: Well-involuted  Adnexa:  Non-palpable  Rectal Exam: No hemorrhoids        Assessment:   Postpartum exam 4 wks s/p SVB after IOL for pre-e Bottlefeeding On HCTZ for pp HTN Depression screening Depression, bipolar- doing well on meds Contraception counseling   Plan:  Continue HCTZ for now, bp still high normal on meds Contraception: tubal ligation Follow up in: 4 weeks for bp f/u, stop taking HCTZ 2d before appt  Marge DuncansBooker, Kimberly  Randall CNM, [facility]NP-BC 03/11/2016 2:59 PM

## 2016-03-11 NOTE — Patient Instructions (Signed)
Stop taking the HCTZ (blood pressure medicine) 2 days before you come back for your next visit  Kegel Exercises The goal of Kegel exercises is to isolate and exercise your pelvic floor muscles. These muscles act as a hammock that supports the rectum, vagina, small intestine, and uterus. As the muscles weaken, the hammock sags and these organs are displaced from their normal positions. Kegel exercises can strengthen your pelvic floor muscles and help you to improve bladder and bowel control, improve sexual response, and help reduce many problems and some discomfort during pregnancy. Kegel exercises can be done anywhere and at any time. HOW TO PERFORM KEGEL EXERCISES 1. Locate your pelvic floor muscles. To do this, squeeze (contract) the muscles that you use when you try to stop the flow of urine. You will feel a tightness in the vaginal area (women) and a tight lift in the rectal area (men and women). 2. When you begin, contract your pelvic muscles tight for 2-5 seconds, then relax them for 2-5 seconds. This is one set. Do 4-5 sets with a short pause in between. 3. Contract your pelvic muscles for 8-10 seconds, then relax them for 8-10 seconds. Do 4-5 sets. If you cannot contract your pelvic muscles for 8-10 seconds, try 5-7 seconds and work your way up to 8-10 seconds. Your goal is 4-5 sets of 10 contractions each day. Keep your stomach, buttocks, and legs relaxed during the exercises. Perform sets of both short and long contractions. Vary your positions. Perform these contractions 3-4 times per day. Perform sets while you are:   Lying in bed in the morning.  Standing at lunch.  Sitting in the late afternoon.  Lying in bed at night. You should do 40-50 contractions per day. Do not perform more Kegel exercises per day than recommended. Overexercising can cause muscle fatigue. Continue these exercises for for at least 15-20 weeks or as directed by your caregiver.   This information is not intended  to replace advice given to you by your health care provider. Make sure you discuss any questions you have with your health care provider.   Document Released: 06/03/2012 Document Revised: 07/08/2014 Document Reviewed: 06/03/2012 Elsevier Interactive Patient Education [employer]2016 Elsevier Inc.

## 2016-03-26 ENCOUNTER — Encounter: Payer: Self-pay | Admitting: *Deleted

## 2016-04-08 ENCOUNTER — Ambulatory Visit (INDEPENDENT_AMBULATORY_CARE_PROVIDER_SITE_OTHER): Payer: Medicare Other | Admitting: Women's Health

## 2016-04-08 ENCOUNTER — Encounter: Payer: Self-pay | Admitting: Women's Health

## 2016-04-08 DIAGNOSIS — R42 Dizziness and giddiness: Secondary | ICD-10-CM | POA: Diagnosis not present

## 2016-04-08 DIAGNOSIS — O165 Unspecified maternal hypertension, complicating the puerperium: Secondary | ICD-10-CM | POA: Diagnosis not present

## 2016-04-08 MED ORDER — AMLODIPINE BESYLATE 10 MG PO TABS: 10.0000 mg | ORAL_TABLET | Freq: Every day | ORAL | 6 refills | Status: DC

## 2016-04-08 NOTE — Patient Instructions (Addendum)
Stop HCTZ (hydrochlorothiazide) Begin Norvasc (amlodipine)   Hypertension Hypertension, commonly called high blood pressure, is when the force of blood pumping through your arteries is too strong. Your arteries are the blood vessels that carry blood from your heart throughout your body. A blood pressure reading consists of a higher number over a lower number, such as 110/72. The higher number (systolic) is the pressure inside your arteries when your heart pumps. The lower number (diastolic) is the pressure inside your arteries when your heart relaxes. Ideally you want your blood pressure below 120/80. Hypertension forces your heart to work harder to pump blood. Your arteries may become narrow or stiff. Having untreated or uncontrolled hypertension can cause heart attack, stroke, kidney disease, and other problems. RISK FACTORS Some risk factors for high blood pressure are controllable. Others are not.  Risk factors you cannot control include:   Race. You may be at higher risk if you are African American.  Age. Risk increases with age.  Gender. Men are at higher risk than women before age 14 years. After age 57, women are at higher risk than men. Risk factors you can control include:  Not getting enough exercise or physical activity.  Being overweight.  Getting too much fat, sugar, calories, or salt in your diet.  Drinking too much alcohol. SIGNS AND SYMPTOMS Hypertension does not usually cause signs or symptoms. Extremely high blood pressure (hypertensive crisis) may cause headache, anxiety, shortness of breath, and nosebleed. DIAGNOSIS To check if you have hypertension, your health care provider will measure your blood pressure while you are seated, with your arm held at the level of your heart. It should be measured at least twice using the same arm. Certain conditions can cause a difference in blood pressure between your right and left arms. A blood pressure reading that is higher than  normal on one occasion does not mean that you need treatment. If it is not clear whether you have high blood pressure, you may be asked to return on a different day to have your blood pressure checked again. Or, you may be asked to monitor your blood pressure at home for 1 or more weeks. TREATMENT Treating high blood pressure includes making lifestyle changes and possibly taking medicine. Living a healthy lifestyle can help lower high blood pressure. You may need to change some of your habits. Lifestyle changes may include:  Following the DASH diet. This diet is high in fruits, vegetables, and whole grains. It is low in salt, red meat, and added sugars.  Keep your sodium intake below 2,300 mg per day.  Getting at least 30-45 minutes of aerobic exercise at least 4 times per week.  Losing weight if necessary.  Not smoking.  Limiting alcoholic beverages.  Learning ways to reduce stress. Your health care provider may prescribe medicine if lifestyle changes are not enough to get your blood pressure under control, and if one of the following is true:  You are 40-29 years of age and your systolic blood pressure is above 140.  You are 75 years of age or older, and your systolic blood pressure is above 150.  Your diastolic blood pressure is above 90.  You have diabetes, and your systolic blood pressure is over 140 or your diastolic blood pressure is over 90.  You have kidney disease and your blood pressure is above 140/90.  You have heart disease and your blood pressure is above 140/90. Your personal target blood pressure may vary depending on your medical conditions,  your age, and other factors. HOME CARE INSTRUCTIONS  Have your blood pressure rechecked as directed by your health care provider.   Take medicines only as directed by your health care provider. Follow the directions carefully. Blood pressure medicines must be taken as prescribed. The medicine does not work as well when you  skip doses. Skipping doses also puts you at risk for problems.  Do not smoke.   Monitor your blood pressure at home as directed by your health care provider. SEEK MEDICAL CARE IF:   You think you are having a reaction to medicines taken.  You have recurrent headaches or feel dizzy.  You have swelling in your ankles.  You have trouble with your vision. SEEK IMMEDIATE MEDICAL CARE IF:  You develop a severe headache or confusion.  You have unusual weakness, numbness, or feel faint.  You have severe chest or abdominal pain.  You vomit repeatedly.  You have trouble breathing. MAKE SURE YOU:   Understand these instructions.  Will watch your condition.  Will get help right away if you are not doing well or get worse.   This information is not intended to replace advice given to you by your health care provider. Make sure you discuss any questions you have with your health care provider.   Document Released: 06/17/2005 Document Revised: 11/01/2014 Document Reviewed: 04/09/2013 Elsevier Interactive Patient Education [employer]2016 Elsevier Inc.

## 2016-04-08 NOTE — Progress Notes (Signed)
   [facility] Visit  Patient name: Connie [name] MRN [id number]  Date of birth: December 25, 1990  CC & HPI:  Connie [name] is a 25 y.o. 723P2002 Caucasian female 8wks s/p SVB after IOL for pre-e, presenting today for bp check. Has been on hctz 25mg  daily since d/c, was supposed to stop 2d ago, but did not. Took at 0700 this am. Feels like the medicine makes her dizzy. Does have strong family h/o HTN. Bottlefeeding.  No LMP recorded. The current method of family planning is tubal ligation. Last pap 07/2015 neg  Pertinent History Reviewed:  Medical & Surgical Hx:   Past medical, surgical, family, and social history reviewed in electronic medical record Medications: Reviewed & Updated - see associated section Allergies: Reviewed in electronic medical record  Objective Findings:  Vitals: BP (!) 150/98 (BP Location: Right Arm, Patient Position: Sitting, Cuff Size: Normal)   Pulse 88   Wt 168 lb (76.2 kg)   Breastfeeding? No   BMI 33.93 kg/m  Body mass index is 33.93 kg/m.  Physical Examination: General appearance - alert, well appearing, and in no distress  No results found for this or any previous visit (from the past 24 hour(s)).   Assessment & Plan:  A:   Continued pp HTN  8wks s/p SVB after IOL for pre-e   Bottlefeeding  Dizziness w/ HCTZ  P:  Stop HCTZ  Rx Norvasc 10mg  daily  Return in about 4 days (around 04/12/2016) for F/U. to make sure Norvasc helping BP, then will plan another f/u in 2mths- to stop Norvasc 2d prior to that visit  Marge DuncansBooker, Mirella Gueye Randall CNM, WHNP-BC 04/08/2016 11:05 AM

## 2016-04-12 ENCOUNTER — Ambulatory Visit: Payer: Medicare Other | Admitting: Women's Health

## 2016-04-17 ENCOUNTER — Encounter: Payer: Self-pay | Admitting: Adult Health

## 2016-04-17 ENCOUNTER — Ambulatory Visit (INDEPENDENT_AMBULATORY_CARE_PROVIDER_SITE_OTHER): Payer: Medicare Other | Admitting: Adult Health

## 2016-04-17 VITALS — BP 130/90 | HR 92 | Ht 60.0 in | Wt 173.5 lb

## 2016-04-17 DIAGNOSIS — I10 Essential (primary) hypertension: Secondary | ICD-10-CM | POA: Diagnosis not present

## 2016-04-17 DIAGNOSIS — O165 Unspecified maternal hypertension, complicating the puerperium: Secondary | ICD-10-CM

## 2016-04-17 DIAGNOSIS — N9089 Other specified noninflammatory disorders of vulva and perineum: Secondary | ICD-10-CM | POA: Diagnosis not present

## 2016-04-17 DIAGNOSIS — L298 Other pruritus: Secondary | ICD-10-CM | POA: Diagnosis not present

## 2016-04-17 DIAGNOSIS — N898 Other specified noninflammatory disorders of vagina: Secondary | ICD-10-CM | POA: Diagnosis not present

## 2016-04-17 LAB — POCT WET PREP (WET MOUNT): WBC, Wet Prep HPF POC: POSITIVE

## 2016-04-17 MED ORDER — METRONIDAZOLE 500 MG PO TABS: 500.0000 mg | ORAL_TABLET | Freq: Two times a day (BID) | ORAL | 0 refills | Status: DC

## 2016-04-17 MED ORDER — VALACYCLOVIR HCL 1 G PO TABS: 1000.0000 mg | ORAL_TABLET | Freq: Two times a day (BID) | ORAL | 1 refills | Status: DC

## 2016-04-17 NOTE — Progress Notes (Signed)
Subjective:     Patient ID: Connie [name], female   DOB: 06-22-1991, 25 y.o.   MRN: [id number]  HPI Connie [name] is a 25 year old white female in for BP check after starting norvasc 10 mg.She had vaginal delivery 8/11 baby girl, she was induced for pre eclampsia. She is complaining of vaginal itching and ?abscess had one about 3 weeks ago in same spot, has had new sex partner too. She says she had tubes tied.  Review of Systems +vaginal itching + vulva irritation Reviewed past medical,surgical, social and family history. Reviewed medications and allergies.     Objective:   Physical Exam BP 130/90 (BP Location: Left Arm, Patient Position: Sitting, Cuff Size: Normal)   Pulse 92   Ht 5' (1.524 m)   Wt 173 lb 8 oz (78.7 kg)   LMP 03/12/2016 (Approximate)   Breastfeeding? No   BMI 33.88 kg/m  PHQ 2 score 0  Skin warm and dry. Lungs: clear to ausculation bilaterally. Cardiovascular: regular rate and rhythm.  Pelvic: external genitalia is normal in appearance,has linear line right labia,HSV culture taken, vagina: white discharge without odor,urethra has no lesions or masses noted, cervix:smooth and bulbous, uterus: normal size, shape and contour, non tender, no masses felt, adnexa: no masses or tenderness noted. Bladder is non tender and no masses felt. Wet prep: + for clue cells and +WBCs. GC/CHL obtained.   Assessment:     1. Postpartum hypertension   2. Vaginal itching   3. Vaginal discharge   4. Vulvar irritation       Plan:     GC/CHL sent HSV culture sent Meds ordered this encounter  Medications  . metroNIDAZOLE (FLAGYL) 500 MG tablet    Sig: Take 1 tablet (500 mg total) by mouth 2 (two) times daily.    Dispense:  14 tablet    Refill:  0    Order Specific Question:   Supervising Provider    Answer:   Despina HiddenEURE, LUTHER H [[id number]]  . valACYclovir (VALTREX) 1000 MG tablet    Sig: Take 1 tablet (1,000 mg total) by mouth 2 (two) times daily.    Dispense:  20 tablet    Refill:  1   Order Specific Question:   Supervising Provider    Answer:   Duane LopeEURE, LUTHER H [[id number]]  Continue Norvasc but stop 2 days before next visit No alcohol or sex during treatement Review handout on herpes and BV Return in 2 months for follow up BP check

## 2016-04-17 NOTE — Patient Instructions (Signed)
Genital Herpes Genital herpes is a common sexually transmitted infection (STI) that is caused by a virus. The virus is spread from person to person through sexual contact. Infection can cause itching, blisters, and sores in the genital area or rectal area. This is called an outbreak. It affects both men and women. Genital herpes is particularly concerning for pregnant women because the virus can be passed to the baby during delivery and cause serious problems. Genital herpes is also a concern for people with a weakened defense (immune) system. Symptoms of genital herpes may last several days and then go away. However, the virus remains in your body, so you may have more outbreaks of symptoms in the future. The time between outbreaks varies and can be months or years. CAUSES Genital herpes is caused by a virus called herpes simplex virus (HSV) type 2 or HSV type 1. These viruses are contagious and are most often spread through sexual contact with an infected person. Sexual contact includes vaginal, anal, and oral sex. RISK FACTORS Risk factors for genital herpes include:  Being sexually active with multiple partners.  Having unprotected sex. SIGNS AND SYMPTOMS Symptoms may include:  Pain and itching in the genital area or rectal area.  Small red bumps that turn into blisters and then turn into sores.  Flu-like symptoms, including:  Fever.  Body aches.  Painful urination.  Vaginal discharge. DIAGNOSIS Genital herpes may be diagnosed by:  Physical exam.  Blood test.  Fluid culture test from an open sore. TREATMENT There is no cure for genital herpes. Oral antiviral medicines may be used to speed up healing and to help prevent the return of symptoms. These medicines can also help to reduce the spread of the virus to sexual partners. HOME CARE INSTRUCTIONS  Keep the affected areas dry and clean.  Take medicines only as directed by your health care provider.  Do not have sexual  contact during active infections. Genital herpes is contagious.  Practice safe sex. Latex condoms and female condoms may help to prevent the spread of the herpes virus.  Avoid rubbing or touching the blisters and sores. If you do touch the blister or sores:  Wash your hands thoroughly.  Do not touch your eyes afterward.  If you become pregnant, tell your health care provider if you have had genital herpes.  Keep all follow-up visits as directed by your health care provider. This is important. PREVENTION  Use condoms. Although anyone can contract genital herpes during sexual contact even with the use of a condom, a condom can provide some protection.  Avoid having multiple sexual partners.  Talk to your sexual partner about any symptoms and past history that either of you may have.  Get tested before you have sex. Ask your partner to do the same.  Recognize the symptoms of genital herpes. Do not have sexual contact if you notice these symptoms. SEEK MEDICAL CARE IF:  Your symptoms are not improving with medicine.  Your symptoms return.  You have new symptoms.  You have a fever.  You have abdominal pain.  You have redness, swelling, or pain in your eye. MAKE SURE YOU:  Understand these instructions.  Will watch your condition.  Will get help right away if you are not doing well or get worse.   This information is not intended to replace advice given to you by your health care provider. Make sure you discuss any questions you have with your health care provider.   Document Released: 06/14/2000   Document Revised: 07/08/2014 Document Reviewed: 11/02/2013 Elsevier Interactive Patient Education 2016 Elsevier Inc. Bacterial Vaginosis Bacterial vaginosis is a vaginal infection that occurs when the normal balance of bacteria in the vagina is disrupted. It results from an overgrowth of certain bacteria. This is the most common vaginal infection in women of childbearing age.  Treatment is important to prevent complications, especially in pregnant women, as it can cause a premature delivery. CAUSES  Bacterial vaginosis is caused by an increase in harmful bacteria that are normally present in smaller amounts in the vagina. Several different kinds of bacteria can cause bacterial vaginosis. However, the reason that the condition develops is not fully understood. RISK FACTORS Certain activities or behaviors can put you at an increased risk of developing bacterial vaginosis, including:  Having a new sex partner or multiple sex partners.  Douching.  Using an intrauterine device (IUD) for contraception. Women do not get bacterial vaginosis from toilet seats, bedding, swimming pools, or contact with objects around them. SIGNS AND SYMPTOMS  Some women with bacterial vaginosis have no signs or symptoms. Common symptoms include:  Grey vaginal discharge.  A fishlike odor with discharge, especially after sexual intercourse.  Itching or burning of the vagina and vulva.  Burning or pain with urination. DIAGNOSIS  Your health care provider will take a medical history and examine the vagina for signs of bacterial vaginosis. A sample of vaginal fluid may be taken. Your health care provider will look at this sample under a microscope to check for bacteria and abnormal cells. A vaginal pH test may also be done.  TREATMENT  Bacterial vaginosis may be treated with antibiotic medicines. These may be given in the form of a pill or a vaginal cream. A second round of antibiotics may be prescribed if the condition comes back after treatment. Because bacterial vaginosis increases your risk for sexually transmitted diseases, getting treated can help reduce your risk for chlamydia, gonorrhea, HIV, and herpes. HOME CARE INSTRUCTIONS   Only take over-the-counter or prescription medicines as directed by your health care provider.  If antibiotic medicine was prescribed, take it as directed.  Make sure you finish it even if you start to feel better.  Tell all sexual partners that you have a vaginal infection. They should see their health care provider and be treated if they have problems, such as a mild rash or itching.  During treatment, it is important that you follow these instructions:  Avoid sexual activity or use condoms correctly.  Do not douche.  Avoid alcohol as directed by your health care provider.  Avoid breastfeeding as directed by your health care provider. SEEK MEDICAL CARE IF:   Your symptoms are not improving after 3 days of treatment.  You have increased discharge or pain.  You have a fever. MAKE SURE YOU:   Understand these instructions.  Will watch your condition.  Will get help right away if you are not doing well or get worse. FOR MORE INFORMATION  Centers for Disease Control and Prevention, Division of STD Prevention: [employer].co.zawww.cdc.gov/std American Sexual Health Association (ASHA): www.ashastd.org    This information is not intended to replace advice given to you by your health care provider. Make sure you discuss any questions you have with your health care provider.   Document Released: 06/17/2005 Document Revised: 07/08/2014 Document Reviewed: 01/27/2013 Elsevier Interactive Patient Education 2016 [employer] No alcohol No sex during treatment Follow up in 2 months stop norvasc 2 days before that appt

## 2016-04-19 LAB — HERPES SIMPLEX VIRUS CULTURE

## 2016-04-19 LAB — GC/CHLAMYDIA PROBE AMP
Chlamydia trachomatis, NAA: NEGATIVE
Neisseria gonorrhoeae by PCR: NEGATIVE

## 2016-04-22 ENCOUNTER — Telehealth: Payer: Self-pay | Admitting: Adult Health

## 2016-04-22 NOTE — Telephone Encounter (Signed)
Spoke with pt letting her know herpes test and GC/CHL was negative. Pt voiced understanding. JSY

## 2016-04-22 NOTE — Telephone Encounter (Signed)
Left message that tests negative, and hope you are feeling better

## 2016-05-02 ENCOUNTER — Encounter: Payer: Self-pay | Admitting: *Deleted

## 2016-05-09 ENCOUNTER — Ambulatory Visit: Payer: Medicare Other | Admitting: Obstetrics & Gynecology

## 2016-05-09 ENCOUNTER — Encounter: Payer: Self-pay | Admitting: *Deleted

## 2016-06-17 ENCOUNTER — Ambulatory Visit: Payer: Medicare Other | Admitting: Adult Health

## 2016-08-31 ENCOUNTER — Emergency Department (HOSPITAL_COMMUNITY): Payer: Medicare Other

## 2016-08-31 ENCOUNTER — Emergency Department (HOSPITAL_COMMUNITY)
Admission: EM | Admit: 2016-08-31 | Discharge: 2016-08-31 | Disposition: A | Discharge status: Home / Self Care | Payer: Medicare Other | Attending: Emergency Medicine | Admitting: Emergency Medicine

## 2016-08-31 ENCOUNTER — Encounter (HOSPITAL_COMMUNITY): Payer: Self-pay

## 2016-08-31 DIAGNOSIS — G43A1 Cyclical vomiting, intractable: Secondary | ICD-10-CM | POA: Insufficient documentation

## 2016-08-31 DIAGNOSIS — I10 Essential (primary) hypertension: Secondary | ICD-10-CM | POA: Diagnosis not present

## 2016-08-31 DIAGNOSIS — R112 Nausea with vomiting, unspecified: Secondary | ICD-10-CM | POA: Diagnosis present

## 2016-08-31 DIAGNOSIS — R1031 Right lower quadrant pain: Secondary | ICD-10-CM | POA: Diagnosis not present

## 2016-08-31 DIAGNOSIS — F17210 Nicotine dependence, cigarettes, uncomplicated: Secondary | ICD-10-CM | POA: Diagnosis not present

## 2016-08-31 DIAGNOSIS — R1115 Cyclical vomiting syndrome unrelated to migraine: Secondary | ICD-10-CM

## 2016-08-31 LAB — COMPREHENSIVE METABOLIC PANEL
ALT: 22 U/L (ref 14–54)
AST: 38 U/L (ref 15–41)
Albumin: 4.9 g/dL (ref 3.5–5.0)
Alkaline Phosphatase: 43 U/L (ref 38–126)
Anion gap: 14 (ref 5–15)
BUN: <5 mg/dL — ABNORMAL LOW (ref 6–20)
CO2: 21 mmol/L — ABNORMAL LOW (ref 22–32)
Calcium: 9.6 mg/dL (ref 8.9–10.3)
Chloride: 100 mmol/L — ABNORMAL LOW (ref 101–111)
Creatinine, Ser: 1.06 mg/dL — ABNORMAL HIGH (ref 0.44–1.00)
GFR calc Af Amer: >60 mL/min (ref >60)
GFR calc non Af Amer: >60 mL/min (ref >60)
Glucose, Bld: 111 mg/dL — ABNORMAL HIGH (ref 65–99)
Potassium: 3.2 mmol/L — ABNORMAL LOW (ref 3.5–5.1)
Sodium: 135 mmol/L (ref 135–145)
Total Bilirubin: 1.5 mg/dL — ABNORMAL HIGH (ref 0.3–1.2)
Total Protein: 7.4 g/dL (ref 6.5–8.1)

## 2016-08-31 LAB — PREGNANCY, URINE: Preg Test, Ur: NEGATIVE

## 2016-08-31 LAB — URINALYSIS, ROUTINE W REFLEX MICROSCOPIC
Bilirubin Urine: NEGATIVE
Glucose, UA: NEGATIVE mg/dL
Ketones, ur: 20 mg/dL — AB
Leukocytes, UA: NEGATIVE
Nitrite: NEGATIVE
Protein, ur: NEGATIVE mg/dL
Specific Gravity, Urine: 1.011 (ref 1.005–1.030)
pH: 8 (ref 5.0–8.0)

## 2016-08-31 LAB — CBC WITH DIFFERENTIAL/PLATELET
Basophils Absolute: 0 10*3/uL (ref 0.0–0.1)
Basophils Relative: 0 %
Eosinophils Absolute: 0 10*3/uL (ref 0.0–0.7)
Eosinophils Relative: 0 %
HCT: 42.9 % (ref 36.0–46.0)
Hemoglobin: 14.2 g/dL (ref 12.0–15.0)
Lymphocytes Relative: 16 %
Lymphs Abs: 1.4 10*3/uL (ref 0.7–4.0)
MCH: 29.5 pg (ref 26.0–34.0)
MCHC: 33.1 g/dL (ref 30.0–36.0)
MCV: 89.2 fL (ref 78.0–100.0)
Monocytes Absolute: 0.5 10*3/uL (ref 0.1–1.0)
Monocytes Relative: 6 %
Neutro Abs: 6.8 10*3/uL (ref 1.7–7.7)
Neutrophils Relative %: 78 %
Platelets: 395 10*3/uL (ref 150–400)
RBC: 4.81 MIL/uL (ref 3.87–5.11)
RDW: 16.1 % — ABNORMAL HIGH (ref 11.5–15.5)
WBC: 8.7 10*3/uL (ref 4.0–10.5)

## 2016-08-31 LAB — LIPASE, BLOOD: Lipase: 10 U/L — ABNORMAL LOW (ref 11–51)

## 2016-08-31 MED ORDER — DICYCLOMINE HCL 20 MG PO TABS: 20.0000 mg | ORAL_TABLET | Freq: Three times a day (TID) | ORAL | 0 refills | Status: DC

## 2016-08-31 MED ORDER — DIPHENHYDRAMINE HCL 50 MG/ML IJ SOLN
25.0000 mg | Freq: Once | INTRAMUSCULAR | Status: AC
  Administered 2016-08-31: 25 mg via INTRAVENOUS
  Filled 2016-08-31: qty 1

## 2016-08-31 MED ORDER — KETOROLAC TROMETHAMINE 15 MG/ML IJ SOLN
15.0000 mg | Freq: Once | INTRAMUSCULAR | Status: AC
  Administered 2016-08-31: 15 mg via INTRAVENOUS
  Filled 2016-08-31: qty 1

## 2016-08-31 MED ORDER — HALOPERIDOL LACTATE 5 MG/ML IJ SOLN
1.0000 mg | Freq: Once | INTRAMUSCULAR | Status: AC
  Administered 2016-08-31: 1 mg via INTRAVENOUS
  Filled 2016-08-31: qty 1

## 2016-08-31 MED ORDER — SODIUM CHLORIDE 0.9 % IV BOLUS (SEPSIS)
500.0000 mL | Freq: Once | INTRAVENOUS | Status: AC
  Administered 2016-08-31: 500 mL via INTRAVENOUS

## 2016-08-31 MED ORDER — IOPAMIDOL (ISOVUE-300) INJECTION 61%
INTRAVENOUS | Status: AC
  Filled 2016-08-31: qty 100

## 2016-08-31 MED ORDER — SODIUM CHLORIDE 0.9 % IV BOLUS (SEPSIS)
1000.0000 mL | Freq: Once | INTRAVENOUS | Status: AC
  Administered 2016-08-31: 1000 mL via INTRAVENOUS

## 2016-08-31 MED ORDER — LORAZEPAM 2 MG/ML IJ SOLN
1.0000 mg | Freq: Once | INTRAMUSCULAR | Status: AC
  Administered 2016-08-31: 1 mg via INTRAVENOUS
  Filled 2016-08-31: qty 1

## 2016-08-31 NOTE — ED Notes (Signed)
Patient is yelling and being loud stating that her stomach hurts. She has tried 2 times to use the restroom and states that nothing will come  Out. Ambulatory to restroom both times

## 2016-08-31 NOTE — Discharge Instructions (Signed)
Please take 1000 mg tylenol + 600 mg ibuprofen for pain as needed.  Please take bentyl for spasms.  Use your home phenergan for nausea and vomiting.   Please follow up with gastroenterology for further discussion of your chronic abdominal pain, nausea and vomiting.
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
## 2016-08-31 NOTE — ED Notes (Addendum)
Patient up and walking around yelling that her stomach hurts. meds given and Dr. Penne LashIssacs at bedside. Patient agrees to try and take a nap. Light turned off, pillow given and patient states she is comfortable. Patient refuses to stay on the monitor

## 2016-08-31 NOTE — ED Notes (Signed)
Papers reviewed with patient. She requested mesh underoos and those were given to her. Reports decreased pain and leaving willingly

## 2016-08-31 NOTE — ED Triage Notes (Signed)
Patient CO of severe ABD pain in the LRQ that has been going on for 2 weeks. N/V for the past 2 weeks and she sts she has been able to eat for the past 7 days. Patient is yelling that her stomach hurts and pain is a 10/10. Hy. Of colitis and kidney stones

## 2016-08-31 NOTE — ED Notes (Signed)
Ambulated to restroom again

## 2016-08-31 NOTE — ED Notes (Signed)
Checked on patient  Chest rise and fall noted

## 2017-03-13 ENCOUNTER — Encounter: Payer: Self-pay | Admitting: *Deleted

## 2017-03-13 ENCOUNTER — Other Ambulatory Visit: Payer: Medicare Other | Admitting: Advanced Practice Midwife

## 2017-04-25 ENCOUNTER — Encounter (INDEPENDENT_AMBULATORY_CARE_PROVIDER_SITE_OTHER): Payer: Self-pay

## 2017-04-25 ENCOUNTER — Encounter: Payer: Self-pay | Admitting: Adult Health

## 2017-04-25 ENCOUNTER — Ambulatory Visit (INDEPENDENT_AMBULATORY_CARE_PROVIDER_SITE_OTHER): Payer: Medicare Other | Admitting: Adult Health

## 2017-04-25 VITALS — BP 128/86 | HR 82 | Ht 60.0 in | Wt 152.5 lb

## 2017-04-25 DIAGNOSIS — N941 Unspecified dyspareunia: Secondary | ICD-10-CM | POA: Diagnosis not present

## 2017-04-25 DIAGNOSIS — R112 Nausea with vomiting, unspecified: Secondary | ICD-10-CM | POA: Diagnosis not present

## 2017-04-25 NOTE — Patient Instructions (Signed)
Dyspareunia, Female Dyspareunia is pain that is associated with sexual activity. This can affect any part of the genitals or lower abdomen, and there are many possible causes. This condition ranges from mild to severe. Depending on the cause, dyspareunia may get better with treatment, or it may return (recur) over time. What are the causes? The cause of this condition is not always known. Possible causes include:  Cancer.  Psychological factors, such as depression, anxiety, or previous traumatic experiences.  Severe pain and tenderness of the skin around the vagina (vulva) when it is touched (vulvar vestibulitis syndrome).  Infection of the pelvis or the vulva.  Infection of the vagina.  Painful, involuntary tightening (contraction) of the vaginal muscles when anything is put inside the vagina (vaginismus).  Allergic reaction.  Ovarian cysts.  Solid growths of tissue (tumors) in the ovaries or the uterus.  Scar tissue in the ovaries, vagina, or pelvis.  Vaginal dryness.  Thinning of the tissue (atrophy) of the vulva and vagina.  Skin conditions that affect the vulva (vulvar dermatoses), such as lichen sclerosus or lichen planus.  Endometriosis.  Tubal pregnancy.  A tilted uterus.  Uterine prolapse.  Adhesions in the vagina.  Bladder problems.  Intestinal problems.  Certain medicines.  Medical conditions such as diabetes, arthritis, or thyroid disease.  What increases the risk? The following factors may make you more likely to develop this condition:  Having experienced physical or sexual trauma.  Having given birth more than once.  Taking birth control pills.  Having gone through menopause.  Having recently given birth, typically within the past 3-6 months.  Breastfeeding.  What are the signs or symptoms? The main symptom of this condition is pain in any part of the genitals or lower abdomen during or after sexual activity. This may include pain  during sexual arousal, genital stimulation, or orgasm. Pain may get worse when anything is inserted into the vagina, or when the genitals are touched in any way, such as when sitting or wearing pants. Pain can range from mild to severe, depending on the cause of the condition. In some cases, symptoms go away with treatment and return (recur) at a later date. How is this diagnosed? This condition may be diagnosed based on:  Your symptoms, including: ? Where your pain is located. ? When your pain occurs.  Your medical history.  A physical exam. This may include a pelvic exam and a Pap test. This is a screening test that is used to check for signs of cancer of the vagina, cervix, and uterus.  Tests, including: ? Blood tests. ? Ultrasound. This uses sound waves to make a picture of the area that is being tested. ? Urine culture. This test involves checking a urine sample for signs of infection. ? Culture test. This is when your health care provider uses a swab to collect a sample of vaginal fluid. The sample is checked for signs of infection. ? X-rays. ? MRI. ? CT scan. ? Laparoscopy. This is a procedure in which a small incision is made in your lower abdomen and a lighted, pencil-sized instrument (laparoscope) is passed through the incision and used to look inside your pelvis.  You may be referred to a health care provider who specializes in women's health (gynecologist). In some cases, diagnosing the cause of dyspareunia can be difficult. How is this treated? Treatment depends on the cause of your condition and your symptoms. In most cases, you may need to stop sexual activity until your symptoms   improve. Treatment may include:  Lubricants.  Kegel exercises or vaginal dilators.  Medicated skin creams.  Medicated vaginal creams.  Hormonal therapy.  Antibiotic medicine to prevent or fight infection.  Medicines that help to relieve pain.  Medicines that treat depression  (antidepressants).  Psychological counseling.  Sex therapy.  Surgery.  Follow these instructions at home: Lifestyle  Avoid tight clothing and irritating materials around your genital and abdominal area.  Use water-based lubricants as needed. Avoid oil-based lubricants.  Do not use any products that irritate you. This may include certain condoms, spermicides, lubricants, soaps, tampons, vaginal sprays, or douches.  Always practice safe sex. Talk with your health care provider about which form of birth control (contraception) is best for you.  Maintain open communication with your sexual partner. General instructions  Take over-the-counter and prescription medicines only as told by your health care provider.  If you had tests done, it is your responsibility to get your tests results. Ask your health care provider or the department performing the test when your results will be ready.  Urinate before you engage in sexual activity.  Consider joining a support group.  Keep all follow-up visits as told by your health care provider. This is important. Contact a health care provider if:  You develop vaginal bleeding after sexual intercourse.  You develop a lump at the opening of your vagina. Seek medical care even if the lump is painless.  You have: ? Abnormal vaginal discharge. ? Vaginal dryness. ? Itchiness or irritation of your vulva or vagina. ? A new rash. ? Symptoms that get worse or do not improve with treatment. ? A fever. ? Pain when you urinate. ? Blood in your urine. Get help right away if:  You develop severe pain in your abdomen during or shortly after sexual intercourse.  You pass out after having sexual intercourse. This information is not intended to replace advice given to you by your health care provider. Make sure you discuss any questions you have with your health care provider. Document Released: 07/07/2007 Document Revised: 10/27/2015 Document  Reviewed: 01/17/2015 Elsevier Interactive Patient Education  2018 [employer]  

## 2017-04-25 NOTE — Progress Notes (Addendum)
Subjective:     Patient ID: Connie [name], female   DOB: 1991/02/15, 26 y.o.   MRN: [id number]  HPI Connie [name] is a 26 year old white female in complaining of pain with sex for last 3 months or so and is having nausea and vomiting with periods for last year, since last baby born and had tubal. She describes the pain with sex, as achy and painful, has had to stop sex, and take warm bath and use heating pad and take tylenol.Her mom who is [phone] is in hospital for MI.  Review of Systems Pain with sex Nausea and vomiting with periods Reviewed past medical,surgical, social and family history. Reviewed medications and allergies.     Objective:   Physical Exam BP 128/86 (BP Location: Right Arm, Patient Position: Sitting, Cuff Size: Normal)   Pulse 82   Ht 5' (1.524 m)   Wt 152 lb 8 oz (69.2 kg)   LMP 04/06/2017   BMI 29.78 kg/m  Skin warm and dry.Pelvic: external genitalia is normal in appearance no lesions, vagina: pink with good moisture,urethra has no lesions or masses noted, cervix:everted at os,and bulbous, no CMT,uterus: normal size, shape and contour, mildly tender, no masses felt, adnexa: no masses or tenderness noted. Bladder is non tender and no masses felt.  GC/CHL obtained.    Will get US to assess uterus.  Assessment:    Dyspareunia Nausea and vomiting with period    Plan:     GC/CHL sent Return in 1 week for GYN US Review handout on dyspareunia

## 2017-04-29 ENCOUNTER — Telehealth: Payer: Self-pay | Admitting: *Deleted

## 2017-04-29 LAB — GC/CHLAMYDIA PROBE AMP
Chlamydia trachomatis, NAA: NEGATIVE
Neisseria gonorrhoeae by PCR: NEGATIVE

## 2017-04-29 NOTE — Telephone Encounter (Signed)
Patient called stating she was told not to have intercourse until she was evaluated for possible "dropped uterus". States she wants to make Connie [name] aware that she had intercourse this morning and is now bleeding like a period (no cramping) but her period is not supposed to start until next week. She is scheduled for an ultrasound tomorrow. Advised patient to keep her appointment unless otherwise informed. Pt verbalized understanding.

## 2017-04-29 NOTE — Telephone Encounter (Signed)
No voice mail.

## 2017-04-30 ENCOUNTER — Encounter: Payer: Self-pay | Admitting: *Deleted

## 2017-04-30 ENCOUNTER — Other Ambulatory Visit: Payer: Medicare Other

## 2017-05-08 ENCOUNTER — Telehealth: Payer: Self-pay | Admitting: Adult Health

## 2017-05-08 NOTE — Telephone Encounter (Signed)
Informed patient Gc/Chl was negative. Verbalized gratitude.

## 2019-02-18 ENCOUNTER — Other Ambulatory Visit: Payer: Medicare Other | Admitting: Adult Health

## 2019-05-18 ENCOUNTER — Telehealth: Payer: Self-pay | Admitting: Adult Health

## 2019-05-18 NOTE — Telephone Encounter (Signed)
Tried to reach the patient at [phone] to remind her of her appointment/restrictions, was told she doesn't stay there and we have the wrong number, to please remove it.  The emergency contact # wasn't working either.

## 2019-05-19 ENCOUNTER — Other Ambulatory Visit: Payer: Medicare Other | Admitting: Adult Health

## 2019-11-08 ENCOUNTER — Telehealth: Payer: Self-pay | Admitting: Adult Health

## 2019-11-08 NOTE — Telephone Encounter (Signed)
No phone number on file to remind of appt and restrictions.

## 2019-11-09 ENCOUNTER — Other Ambulatory Visit: Payer: Medicare Other | Admitting: Adult Health

## 2020-01-23 ENCOUNTER — Encounter (HOSPITAL_COMMUNITY): Payer: Self-pay

## 2020-01-23 ENCOUNTER — Emergency Department (HOSPITAL_COMMUNITY)
Admission: EM | Admit: 2020-01-23 | Discharge: 2020-01-23 | Disposition: A | Discharge status: Home / Self Care | Payer: Medicare Other | Attending: Emergency Medicine | Admitting: Emergency Medicine

## 2020-01-23 ENCOUNTER — Emergency Department (HOSPITAL_COMMUNITY): Payer: Medicare Other

## 2020-01-23 ENCOUNTER — Other Ambulatory Visit: Payer: Self-pay

## 2020-01-23 DIAGNOSIS — I10 Essential (primary) hypertension: Secondary | ICD-10-CM | POA: Insufficient documentation

## 2020-01-23 DIAGNOSIS — F17210 Nicotine dependence, cigarettes, uncomplicated: Secondary | ICD-10-CM | POA: Insufficient documentation

## 2020-01-23 DIAGNOSIS — Z79899 Other long term (current) drug therapy: Secondary | ICD-10-CM | POA: Diagnosis not present

## 2020-01-23 DIAGNOSIS — R0602 Shortness of breath: Secondary | ICD-10-CM | POA: Diagnosis present

## 2020-01-23 DIAGNOSIS — J209 Acute bronchitis, unspecified: Secondary | ICD-10-CM | POA: Diagnosis not present

## 2020-01-23 DIAGNOSIS — Z20822 Contact with and (suspected) exposure to covid-19: Secondary | ICD-10-CM | POA: Insufficient documentation

## 2020-01-23 LAB — BASIC METABOLIC PANEL
Anion gap: 12 (ref 5–15)
BUN: 7 mg/dL (ref 6–20)
CO2: 20 mmol/L — ABNORMAL LOW (ref 22–32)
Calcium: 9.4 mg/dL (ref 8.9–10.3)
Chloride: 105 mmol/L (ref 98–111)
Creatinine, Ser: 0.91 mg/dL (ref 0.44–1.00)
GFR calc Af Amer: >60 mL/min (ref >60)
GFR calc non Af Amer: >60 mL/min (ref >60)
Glucose, Bld: 96 mg/dL (ref 70–99)
Potassium: 3.6 mmol/L (ref 3.5–5.1)
Sodium: 137 mmol/L (ref 135–145)

## 2020-01-23 LAB — CBC WITH DIFFERENTIAL/PLATELET
Abs Immature Granulocytes: 0.04 10*3/uL (ref 0.00–0.07)
Basophils Absolute: 0.1 10*3/uL (ref 0.0–0.1)
Basophils Relative: 1 %
Eosinophils Absolute: 0.2 10*3/uL (ref 0.0–0.5)
Eosinophils Relative: 2 %
HCT: 45.4 % (ref 36.0–46.0)
Hemoglobin: 15.6 g/dL — ABNORMAL HIGH (ref 12.0–15.0)
Immature Granulocytes: 0 %
Lymphocytes Relative: 11 %
Lymphs Abs: 1.2 10*3/uL (ref 0.7–4.0)
MCH: 32.3 pg (ref 26.0–34.0)
MCHC: 34.4 g/dL (ref 30.0–36.0)
MCV: 94 fL (ref 80.0–100.0)
Monocytes Absolute: 0.7 10*3/uL (ref 0.1–1.0)
Monocytes Relative: 6 %
Neutro Abs: 8.4 10*3/uL — ABNORMAL HIGH (ref 1.7–7.7)
Neutrophils Relative %: 80 %
Platelets: 264 10*3/uL (ref 150–400)
RBC: 4.83 MIL/uL (ref 3.87–5.11)
RDW: 13.1 % (ref 11.5–15.5)
WBC: 10.6 10*3/uL — ABNORMAL HIGH (ref 4.0–10.5)
nRBC: 0 % (ref 0.0–0.2)

## 2020-01-23 LAB — URINALYSIS, ROUTINE W REFLEX MICROSCOPIC
Bacteria, UA: NONE SEEN
Bilirubin Urine: NEGATIVE
Glucose, UA: NEGATIVE mg/dL
Ketones, ur: 5 mg/dL — AB
Leukocytes,Ua: NEGATIVE
Nitrite: NEGATIVE
Protein, ur: NEGATIVE mg/dL
Specific Gravity, Urine: 1.02 (ref 1.005–1.030)
pH: 6 (ref 5.0–8.0)

## 2020-01-23 LAB — PREGNANCY, URINE: Preg Test, Ur: NEGATIVE

## 2020-01-23 LAB — SARS CORONAVIRUS 2 BY RT PCR (HOSPITAL ORDER, PERFORMED IN ~~LOC~~ HOSPITAL LAB): SARS Coronavirus 2: NEGATIVE

## 2020-01-23 LAB — LACTIC ACID, PLASMA: Lactic Acid, Venous: 1.7 mmol/L (ref 0.5–1.9)

## 2020-01-23 MED ORDER — AEROCHAMBER Z-STAT PLUS/MEDIUM MISC
1.0000 | Freq: Once | Status: AC
  Administered 2020-01-23: 1

## 2020-01-23 MED ORDER — PREDNISONE 50 MG PO TABS: ORAL_TABLET | ORAL | 0 refills | Status: DC

## 2020-01-23 MED ORDER — METHYLPREDNISOLONE SODIUM SUCC 125 MG IJ SOLR
125.0000 mg | Freq: Once | INTRAMUSCULAR | Status: AC
  Administered 2020-01-23: 125 mg via INTRAVENOUS
  Filled 2020-01-23: qty 2

## 2020-01-23 MED ORDER — AZITHROMYCIN 250 MG PO TABS: ORAL_TABLET | ORAL | 0 refills | Status: DC

## 2020-01-23 MED ORDER — ALBUTEROL SULFATE HFA 108 (90 BASE) MCG/ACT IN AERS
4.0000 | INHALATION_SPRAY | Freq: Once | RESPIRATORY_TRACT | Status: AC
  Administered 2020-01-23: 4 via RESPIRATORY_TRACT
  Filled 2020-01-23: qty 6.7

## 2020-01-23 MED ORDER — AZITHROMYCIN 250 MG PO TABS
500.0000 mg | ORAL_TABLET | Freq: Once | ORAL | Status: AC
  Administered 2020-01-23: 500 mg via ORAL
  Filled 2020-01-23: qty 2

## 2020-01-23 MED ORDER — ACETAMINOPHEN 500 MG PO TABS
1000.0000 mg | ORAL_TABLET | Freq: Once | ORAL | Status: AC
  Administered 2020-01-23: 1000 mg via ORAL
  Filled 2020-01-23: qty 2

## 2020-01-23 MED ORDER — IPRATROPIUM-ALBUTEROL 0.5-2.5 (3) MG/3ML IN SOLN
3.0000 mL | Freq: Once | RESPIRATORY_TRACT | Status: AC
  Administered 2020-01-23: 3 mL via RESPIRATORY_TRACT
  Filled 2020-01-23: qty 3

## 2020-01-23 NOTE — Discharge Instructions (Addendum)
Take each of your medications, the prednisone and the Zithromax as instructed with your next doses Monday evening.  Use your inhaler that you were given this evening taking 2 puffs every 4 hours if your wheezing or shortness of breath returns.  Get rechecked by your primary MD or return here if you develop any worsening symptoms.  Rest make sure you are drinking plenty of fluids.  I recommend  Tylenol per label instructions for fever relief if this symptom returns.

## 2020-01-23 NOTE — ED Triage Notes (Signed)
Pt to er room number 12 via [facility], in route the placed a 20 gauge iv in her R hand, gave three ntg and 324 of aspirin.    Pt states that she has had some chest pain for the past 7 days, pt states that her cough started last night, pt talking in full sentences.  Pt ambulatory to and from bathroom for urine sample.

## 2020-01-23 NOTE — ED Provider Notes (Signed)
[facility] EMERGENCY DEPARTMENT Provider Note   CSN: [id number] Arrival date & time: 01/23/20  1826     History Chief Complaint  Patient presents with  . Chest Pain  . Shortness of Breath    Connie [name] is a 29 y.o. female with a history of bipolar disorder, GERD, hypertension who is a 1/2 pack/day cigarette smoker presenting with a 1 week history of chest pain which she describes as tightness but sharp when she coughs.  She has had a wet sounding but nonproductive cough in addition to the pain.  Yesterday she developed shortness of breath and wheezing.  She has had difficulty taking a deep breath today secondary to pain and tightness in her chest.  She presents with a fever of 101.1.  She was unaware that she was running a fever.  She denies nausea or vomiting, abdominal pain, no sore throat, nasal congestion, rhinorrhea, no decreased sense of smell.  She does report having a headache yesterday.  She denies exposures to any known Covid 19+ people.  She has not been Covid vaccinated.  She was given aspirin and nitroglycerin in route which she states did not relieve her symptoms.  The history is provided by the patient.       Past Medical History:  Diagnosis Date  . Abnormal Pap smear   . Bipolar 1 disorder (HCC)   . Bipolar disorder (HCC)   . BV (bacterial vaginosis) 03/11/2013  . Carpal tunnel syndrome on both sides    with preg  . Depression   . GERD (gastroesophageal reflux disease)   . Headache(784.0)   . Hypertension   . Kidney stones   . Nausea 07/14/2015  . Obesity 06/17/2013  . Ovarian cyst, right   . Pregnancy induced hypertension   . Pregnant 07/14/2015  . Pregnant   . RLQ abdominal pain 03/11/2013  . Unspecified symptom associated with female genital organs 01/06/2014  . Urinary tract infection   . Vaginal discharge 03/11/2013    Patient Active Problem List   Diagnosis Date Noted  . 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 H/O pre-eclampsia 03/11/2016  .  Encounter for sterilization 02/10/2016  . Marijuana use 09/13/2015  . Smoker 09/13/2015  . Nausea 07/14/2015  . History of cocaine use 06/30/2014  . Severe episode of recurrent major depressive disorder, without psychotic features (HCC) 06/26/2014  . Obesity 06/17/2013  . 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 Bipolar affective disorder (HCC) 07/02/2012  . Drug abuse (HCC) 07/02/2012    Past Surgical History:  Procedure Laterality Date  . MYRINGOTOMY    . TONSILLECTOMY    . TONSILLECTOMY AND ADENOIDECTOMY Bilateral   . TUBAL LIGATION N/A 02/10/2016   Procedure: POST PARTUM TUBAL LIGATION;  Surgeon: Willodean Rosenthal, MD;  Location: [facility];  Service: Gynecology;  Laterality: N/A;     OB History    Gravida  2   Para  2   Term  2   Preterm  0   AB  0   Living  2     SAB  0   TAB  0   Ectopic  0   Multiple  0   Live Births  2           Family History  Problem Relation Age of Onset  . Hypertension Mother   . Heart attack Mother   . Hypertension Father   . Diabetes Father   . Diabetes Maternal Grandfather   . Diabetes Paternal Grandmother   . Other Son  swallowing problems  . Hypertension Maternal Aunt   . Hypertension Maternal Uncle   . Hypertension Paternal Aunt   . Hypertension Paternal Uncle     Social History   Tobacco Use  . Smoking status: Current Every Day Smoker    Packs/day: 0.50    Years: 11.00    Pack years: 5.50    Types: Cigarettes  . Smokeless tobacco: Never Used  Substance Use Topics  . Alcohol use: No  . Drug use: No    Home Medications Prior to Admission medications   Medication Sig Start Date End Date Taking? Authorizing Provider  amLODipine (NORVASC) 10 MG tablet Take 1 tablet (10 mg total) by mouth daily. Patient not taking: Reported on 04/25/2017 04/08/16   Cheral MarkerBooker, Kimberly R, CNM  azithromycin (ZITHROMAX Z-PAK) 250 MG tablet Take one tablet daily for 4 days. 01/23/20   Burgess AmorIdol, Kalani Sthilaire, PA-C  dicyclomine (BENTYL) 20 MG  tablet Take 1 tablet (20 mg total) by mouth 3 (three) times daily before meals. Patient not taking: Reported on 04/25/2017 08/31/16   Liberty HandyGibbons, Claudia J, PA-C  omeprazole (PRILOSEC) 20 MG capsule Take 20 mg by mouth daily.    [provider]  oxcarbazepine (TRILEPTAL) 600 MG tablet Take 600 mg by mouth 2 (two) times daily.    [provider]  predniSONE (DELTASONE) 50 MG tablet Take one tablet daily for 4 days 01/23/20   Burgess AmorIdol, Patty Leitzke, PA-C  traZODone (DESYREL) 150 MG tablet Take by mouth at bedtime.    [provider]  zolpidem (AMBIEN) 10 MG tablet Take 10 mg by mouth at bedtime as needed for sleep.    [provider]    Allergies    Sulfa antibiotics, Bactrim [sulfamethoxazole-trimethoprim], and Hydrocodone  Review of Systems   Review of Systems  Constitutional: Positive for fever.  HENT: Negative for congestion and sore throat.   Eyes: Negative.   Respiratory: Positive for cough, chest tightness, shortness of breath and wheezing.   Cardiovascular: Positive for chest pain. Negative for palpitations and leg swelling.  Gastrointestinal: Negative for abdominal pain, nausea and vomiting.  Genitourinary: Negative.   Musculoskeletal: Negative for arthralgias, joint swelling and neck pain.  Skin: Negative.  Negative for rash and wound.  Neurological: Positive for headaches. Negative for dizziness, weakness, light-headedness and numbness.  Psychiatric/Behavioral: Negative.     Physical Exam Updated Vital Signs BP 122/81 (BP Location: Right Arm)   Pulse (!) 109   Temp 99.2 F (37.3 C) (Oral)   Resp 18   Ht 4\' 11"  (1.499 m)   Wt 65.8 kg   SpO2 94%   BMI 29.29 kg/m   Physical Exam Vitals and nursing note reviewed.  Constitutional:      Appearance: She is well-developed.  HENT:     Head: Normocephalic and atraumatic.  Eyes:     Conjunctiva/sclera: Conjunctivae normal.  Cardiovascular:     Rate and Rhythm: Normal rate and regular rhythm.      Heart sounds: Normal heart sounds. No murmur heard.   Pulmonary:     Effort: Prolonged expiration present. No tachypnea.     Breath sounds: Decreased air movement present. No stridor. Wheezing present.     Comments: Auditory wheezing throughout all lung fields.  Wet sounding but nonproductive cough.  Midsternal chest pain which is reproducible with palpation. Chest:    Abdominal:     General: Bowel sounds are normal.     Palpations: Abdomen is soft.     Tenderness: There is no abdominal tenderness.  Musculoskeletal:        General: Normal range of motion.     Cervical back: Normal range of motion.  Skin:    General: Skin is warm and dry.  Neurological:     Mental Status: She is alert.     ED Results / Procedures / Treatments   Labs (all labs ordered are listed, but only abnormal results are displayed) Labs Reviewed  CBC WITH DIFFERENTIAL/PLATELET - Abnormal; Notable for the following components:      Result Value   WBC 10.6 (*)    Hemoglobin 15.6 (*)    Neutro Abs 8.4 (*)    All other components within normal limits  BASIC METABOLIC PANEL - Abnormal; Notable for the following components:   CO2 20 (*)    All other components within normal limits  URINALYSIS, ROUTINE W REFLEX MICROSCOPIC - Abnormal; Notable for the following components:   Hgb urine dipstick SMALL (*)    Ketones, ur 5 (*)    All other components within normal limits  CULTURE, BLOOD (ROUTINE X 2)  CULTURE, BLOOD (ROUTINE X 2)  SARS CORONAVIRUS 2 BY RT PCR (HOSPITAL ORDER, PERFORMED IN [place] [facility])  LACTIC ACID, PLASMA  PREGNANCY, URINE    EKG EKG Interpretation  Date/Time:  Sunday January 23 2020 18:47:49 EDT Ventricular Rate:  99 PR Interval:    QRS Duration: 87 QT Interval:  334 QTC Calculation: 429 R Axis:   19 Text Interpretation: Sinus rhythm Right atrial enlargement No significant change since last tracing Confirmed by Jacalyn Lefevre [phone]) on 01/23/2020 7:30:36  PM   Radiology DG Chest Port 1 View  Result Date: 01/23/2020 CLINICAL DATA:  29 year old female with history of cough and shortness of breath. EXAM: PORTABLE CHEST 1 VIEW COMPARISON:  No priors FINDINGS: Lung volumes are normal. No consolidative airspace disease. No pleural effusions. No pneumothorax. No pulmonary nodule or mass noted. Pulmonary vasculature and the cardiomediastinal silhouette are within normal limits. IMPRESSION: No radiographic evidence of acute cardiopulmonary disease. Electronically Signed   By: Trudie Reed M.D.   On: 01/23/2020 19:34    Procedures Procedures (including critical care time)  Medications Ordered in ED Medications  albuterol (VENTOLIN HFA) 108 (90 Base) MCG/ACT inhaler 4 puff (4 puffs Inhalation Given 01/23/20 1909)  aerochamber Z-Stat Plus/medium 1 each (1 each Other Given 01/23/20 1918)  acetaminophen (TYLENOL) tablet 1,000 mg (1,000 mg Oral Given 01/23/20 1909)  methylPREDNISolone sodium succinate (SOLU-MEDROL) 125 mg/2 mL injection 125 mg (125 mg Intravenous Given 01/23/20 2010)  ipratropium-albuterol (DUONEB) 0.5-2.5 (3) MG/3ML nebulizer solution 3 mL (3 mLs Nebulization Given 01/23/20 2023)  azithromycin (ZITHROMAX) tablet 500 mg (500 mg Oral Given 01/23/20 2023)    ED Course  I have reviewed the triage vital signs and the nursing notes.  Pertinent labs & imaging results that were available during my care of the patient were reviewed by me and considered in my medical decision making (see chart for details).    MDM Rules/Calculators/A&P                          Labs and imaging reviewed.  Pt with clear cxr, exam and history suggesting viral lower respiratory infection with bronchospasm component.  She is covid negative today, no pneumonia present, no risk factors for PE and sx of chest tightness, wheeze and sob resolved after receiving albuterol tx. She was started on steroids, also covered with zithromax in event this is an atypical bacterial  infection.  Blood cx collected given initial VS, pending.  This is not sepsis however.  She was stable at time of dc including resolution of fever.  Albuterol mdi for home, additional pulse dose steroids, remainder of zithromax tx.  Discussed smoking cessation.  Return precautions outlined.  COLLIN HENDLEY was evaluated in Emergency Department on 01/24/2020 for the symptoms described in the history of present illness. She was evaluated in the context of the global COVID-19 pandemic, which necessitated consideration that the patient might be at risk for infection with the SARS-CoV-2 virus that causes COVID-19. Institutional protocols and algorithms that pertain to the evaluation of patients at risk for COVID-19 are in a state of rapid change based on information released by regulatory bodies including the CDC and federal and state organizations. These policies and algorithms were followed during the patient's care in the ED.  Final Clinical Impression(s) / ED Diagnoses Final diagnoses:  Acute bronchitis, unspecified organism    Rx / DC Orders ED Discharge Orders         Ordered    predniSONE (DELTASONE) 50 MG tablet     Discontinue  Reprint     01/23/20 2140    azithromycin (ZITHROMAX Z-PAK) 250 MG tablet     Discontinue  Reprint     01/23/20 2140           Burgess Amor, PA-C 01/24/20 1010    Jacalyn Lefevre, MD 01/26/20 [phone]

## 2020-01-29 LAB — CULTURE, BLOOD (ROUTINE X 2)
Culture: NO GROWTH
Culture: NO GROWTH
Special Requests: ADEQUATE

## 2020-06-04 ENCOUNTER — Encounter (HOSPITAL_COMMUNITY): Payer: Self-pay | Admitting: Emergency Medicine

## 2020-06-04 ENCOUNTER — Emergency Department (EMERGENCY_DEPARTMENT_HOSPITAL)
Admission: EM | Admit: 2020-06-04 | Discharge: 2020-06-05 | Disposition: A | Discharge status: Home / Self Care | Payer: Medicare Other | Source: Home / Self Care | Attending: Emergency Medicine | Admitting: Emergency Medicine

## 2020-06-04 ENCOUNTER — Emergency Department (HOSPITAL_COMMUNITY): Payer: Medicare Other

## 2020-06-04 ENCOUNTER — Other Ambulatory Visit: Payer: Self-pay

## 2020-06-04 DIAGNOSIS — R0789 Other chest pain: Secondary | ICD-10-CM | POA: Insufficient documentation

## 2020-06-04 DIAGNOSIS — R45851 Suicidal ideations: Secondary | ICD-10-CM | POA: Insufficient documentation

## 2020-06-04 DIAGNOSIS — F319 Bipolar disorder, unspecified: Secondary | ICD-10-CM

## 2020-06-04 DIAGNOSIS — Z20822 Contact with and (suspected) exposure to covid-19: Secondary | ICD-10-CM | POA: Insufficient documentation

## 2020-06-04 DIAGNOSIS — I10 Essential (primary) hypertension: Secondary | ICD-10-CM | POA: Insufficient documentation

## 2020-06-04 DIAGNOSIS — F17210 Nicotine dependence, cigarettes, uncomplicated: Secondary | ICD-10-CM | POA: Insufficient documentation

## 2020-06-04 DIAGNOSIS — F32A Depression, unspecified: Secondary | ICD-10-CM

## 2020-06-04 DIAGNOSIS — Z79899 Other long term (current) drug therapy: Secondary | ICD-10-CM | POA: Insufficient documentation

## 2020-06-04 LAB — RAPID URINE DRUG SCREEN, HOSP PERFORMED
Amphetamines: NOT DETECTED
Barbiturates: NOT DETECTED
Benzodiazepines: NOT DETECTED
Cocaine: POSITIVE — AB
Opiates: NOT DETECTED
Tetrahydrocannabinol: NOT DETECTED

## 2020-06-04 LAB — BASIC METABOLIC PANEL
Anion gap: 10 (ref 5–15)
BUN: 7 mg/dL (ref 6–20)
CO2: 21 mmol/L — ABNORMAL LOW (ref 22–32)
Calcium: 8.8 mg/dL — ABNORMAL LOW (ref 8.9–10.3)
Chloride: 107 mmol/L (ref 98–111)
Creatinine, Ser: 1.03 mg/dL — ABNORMAL HIGH (ref 0.44–1.00)
GFR, Estimated: >60 mL/min (ref >60)
Glucose, Bld: 153 mg/dL — ABNORMAL HIGH (ref 70–99)
Potassium: 3.4 mmol/L — ABNORMAL LOW (ref 3.5–5.1)
Sodium: 138 mmol/L (ref 135–145)

## 2020-06-04 LAB — CBC
HCT: 43.4 % (ref 36.0–46.0)
Hemoglobin: 14.4 g/dL (ref 12.0–15.0)
MCH: 32.1 pg (ref 26.0–34.0)
MCHC: 33.2 g/dL (ref 30.0–36.0)
MCV: 96.9 fL (ref 80.0–100.0)
Platelets: 253 10*3/uL (ref 150–400)
RBC: 4.48 MIL/uL (ref 3.87–5.11)
RDW: 12.9 % (ref 11.5–15.5)
WBC: 6.9 10*3/uL (ref 4.0–10.5)
nRBC: 0 % (ref 0.0–0.2)

## 2020-06-04 LAB — PREGNANCY, URINE: Preg Test, Ur: NEGATIVE

## 2020-06-04 LAB — SALICYLATE LEVEL: Salicylate Lvl: <7 mg/dL — ABNORMAL LOW (ref 7.0–30.0)

## 2020-06-04 LAB — ACETAMINOPHEN LEVEL: Acetaminophen (Tylenol), Serum: <10 ug/mL — ABNORMAL LOW (ref 10–30)

## 2020-06-04 LAB — TROPONIN I (HIGH SENSITIVITY)
Troponin I (High Sensitivity): <2 ng/L (ref <18)
Troponin I (High Sensitivity): <2 ng/L (ref <18)

## 2020-06-04 LAB — ETHANOL: Alcohol, Ethyl (B): <10 mg/dL (ref <10)

## 2020-06-04 NOTE — ED Notes (Signed)
Pt self ambulated to restroom with steady gait. Pt remains calm cooperative at this time and denies needs.

## 2020-06-04 NOTE — ED Triage Notes (Signed)
Pt states she has chest pain. EKG in process.

## 2020-06-04 NOTE — ED Provider Notes (Signed)
[facility] EMERGENCY DEPARTMENT Provider Note   CSN: [id number] Arrival date & time: 06/04/20  1249     History Chief Complaint  Patient presents with  . Suicidal    with plan; also experiencing depression    Connie [name] is a 29 y.o. female.  Patient complains of suicidal.  She also has been having some right-sided chest discomfort  The history is provided by the patient. No language interpreter was used.  Altered Mental Status Presenting symptoms: behavior changes   Severity:  Moderate Most recent episode:  Today Episode history:  Multiple Timing:  Constant Progression:  Worsening Chronicity:  Recurrent Context: drug use   Associated symptoms: no abdominal pain, no hallucinations, no headaches, no rash and no seizures        Past Medical History:  Diagnosis Date  . Abnormal Pap smear   . Bipolar 1 disorder (HCC)   . Bipolar disorder (HCC)   . BV (bacterial vaginosis) 03/11/2013  . Carpal tunnel syndrome on both sides    with preg  . Depression   . GERD (gastroesophageal reflux disease)   . Headache(784.0)   . Hypertension   . Kidney stones   . Nausea 07/14/2015  . Obesity 06/17/2013  . Ovarian cyst, right   . Pregnancy induced hypertension   . Pregnant 07/14/2015  . Pregnant   . RLQ abdominal pain 03/11/2013  . Unspecified symptom associated with female genital organs 01/06/2014  . Urinary tract infection   . Vaginal discharge 03/11/2013    Patient Active Problem List   Diagnosis Date Noted  . Bipolar 1 disorder, depressed (HCC)   . 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 H/O pre-eclampsia 03/11/2016  . Encounter for sterilization 02/10/2016  . Marijuana use 09/13/2015  . Smoker 09/13/2015  . Nausea 07/14/2015  . History of cocaine use 06/30/2014  . Severe episode of recurrent major depressive disorder, without psychotic features (HCC) 06/26/2014  . Obesity 06/17/2013  . 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 Bipolar affective disorder (HCC) 07/02/2012   . Drug abuse (HCC) 07/02/2012    Past Surgical History:  Procedure Laterality Date  . MYRINGOTOMY    . TONSILLECTOMY    . TONSILLECTOMY AND ADENOIDECTOMY Bilateral   . TUBAL LIGATION N/A 02/10/2016   Procedure: POST PARTUM TUBAL LIGATION;  Surgeon: Willodean Rosenthal, MD;  Location: [facility];  Service: Gynecology;  Laterality: N/A;     OB History    Gravida  2   Para  2   Term  2   Preterm  0   AB  0   Living  2     SAB  0   TAB  0   Ectopic  0   Multiple  0   Live Births  2           Family History  Problem Relation Age of Onset  . Hypertension Mother   . Heart attack Mother   . Hypertension Father   . Diabetes Father   . Diabetes Maternal Grandfather   . Diabetes Paternal Grandmother   . Other Son        swallowing problems  . Hypertension Maternal Aunt   . Hypertension Maternal Uncle   . Hypertension Paternal Aunt   . Hypertension Paternal Uncle     Social History   Tobacco Use  . Smoking status: Current Every Day Smoker    Packs/day: 0.50    Years: 11.00    Pack years: 5.50    Types: Cigarettes  . Smokeless  tobacco: Never Used  Vaping Use  . Vaping Use: Former  Substance Use Topics  . Alcohol use: No  . Drug use: Yes    Frequency: 5.0 times per week    Types: Cocaine    Comment: pt states she last smoked crack 06/02/20    Home Medications Prior to Admission medications   Medication Sig Start Date End Date Taking? Authorizing Provider  amLODipine (NORVASC) 10 MG tablet Take 1 tablet (10 mg total) by mouth daily. Patient not taking: Reported on 04/25/2017 04/08/16   Cheral Marker, CNM  azithromycin (ZITHROMAX Z-PAK) 250 MG tablet Take one tablet daily for 4 days. 01/23/20   Burgess Amor, PA-C  dicyclomine (BENTYL) 20 MG tablet Take 1 tablet (20 mg total) by mouth 3 (three) times daily before meals. Patient not taking: Reported on 04/25/2017 08/31/16   Liberty Handy, PA-C  omeprazole (PRILOSEC) 20 MG capsule Take 20 mg by  mouth daily.    [provider]  oxcarbazepine (TRILEPTAL) 600 MG tablet Take 600 mg by mouth 2 (two) times daily.    [provider]  predniSONE (DELTASONE) 50 MG tablet Take one tablet daily for 4 days 01/23/20   Burgess Amor, PA-C  traZODone (DESYREL) 150 MG tablet Take by mouth at bedtime.    [provider]  zolpidem (AMBIEN) 10 MG tablet Take 10 mg by mouth at bedtime as needed for sleep.    [provider]    Allergies    Sulfa antibiotics, Bactrim [sulfamethoxazole-trimethoprim], and Hydrocodone  Review of Systems   Review of Systems  Constitutional: Negative for appetite change and fatigue.  HENT: Negative for congestion, ear discharge and sinus pressure.   Eyes: Negative for discharge.  Respiratory: Negative for cough.   Cardiovascular: Positive for chest pain.  Gastrointestinal: Negative for abdominal pain and diarrhea.  Genitourinary: Negative for frequency and hematuria.  Musculoskeletal: Negative for back pain.  Skin: Negative for rash.  Neurological: Negative for seizures and headaches.  Psychiatric/Behavioral: Positive for behavioral problems. Negative for hallucinations.    Physical Exam Updated Vital Signs BP (!) 151/105 (BP Location: Right Arm)   Pulse 72   Temp 98.3 F (36.8 C) (Oral)   Resp 18   Ht 4\' 11"  (1.499 m)   Wt 70.8 kg   SpO2 100%   BMI 31.51 kg/m   Physical Exam Vitals and nursing note reviewed.  Constitutional:      Appearance: She is well-developed.  HENT:     Head: Normocephalic.     Nose: Nose normal.  Eyes:     General: No scleral icterus.    Conjunctiva/sclera: Conjunctivae normal.  Neck:     Thyroid: No thyromegaly.  Cardiovascular:     Rate and Rhythm: Normal rate and regular rhythm.     Heart sounds: No murmur heard.  No friction rub. No gallop.   Pulmonary:     Breath sounds: No stridor. No wheezing or rales.  Chest:     Chest wall: No tenderness.  Abdominal:     General: There is no  distension.     Tenderness: There is no abdominal tenderness. There is no rebound.  Musculoskeletal:        General: Normal range of motion.     Cervical back: Neck supple.  Lymphadenopathy:     Cervical: No cervical adenopathy.  Skin:    Findings: No erythema or rash.  Neurological:     Mental Status: She is alert and oriented to person, place, and  time.     Motor: No abnormal muscle tone.     Coordination: Coordination normal.  Psychiatric:     Comments: Suicidal     ED Results / Procedures / Treatments   Labs (all labs ordered are listed, but only abnormal results are displayed) Labs Reviewed  RAPID URINE DRUG SCREEN, HOSP PERFORMED - Abnormal; Notable for the following components:      Result Value   Cocaine POSITIVE (*)    All other components within normal limits  CBC  PREGNANCY, URINE  BASIC METABOLIC PANEL  ETHANOL  SALICYLATE LEVEL  ACETAMINOPHEN LEVEL  TROPONIN I (HIGH SENSITIVITY)  TROPONIN I (HIGH SENSITIVITY)    EKG EKG Interpretation  Date/Time:  Sunday June 04 2020 13:27:32 EST Ventricular Rate:  79 PR Interval:  140 QRS Duration: 88 QT Interval:  380 QTC Calculation: 435 R Axis:   19 Text Interpretation: Normal sinus rhythm Normal ECG Confirmed by Bethann Berkshire [phone]) on 06/04/2020 3:29:38 PM   Radiology DG Chest 2 View  Result Date: 06/04/2020 CLINICAL DATA:  Chest pain. EXAM: CHEST - 2 VIEW COMPARISON:  01/23/2020 FINDINGS: The lungs are clear without focal pneumonia, edema, pneumothorax or pleural effusion. Interstitial markings are diffusely coarsened with chronic features. The cardiopericardial silhouette is within normal limits for size. The visualized bony structures of the thorax show no acute abnormality. IMPRESSION: No active cardiopulmonary disease. Electronically Signed   By: Kennith Center M.D.   On: 06/04/2020 14:27    Procedures Procedures (including critical care time)  Medications Ordered in ED Medications - No data to  display  ED Course  I have reviewed the triage vital signs and the nursing notes.  Pertinent labs & imaging results that were available during my care of the patient were reviewed by me and considered in my medical decision making (see chart for details). Patient with suicidal ideations.  She'll be seen by behavioral health  Patient also with atypical chest pain.  EKG unremarkable chest x-ray negative.  Troponins pending MDM Rules/Calculators/A&P                          Suicidal ideations with atypical chest pain Final Clinical Impression(s) / ED Diagnoses Final diagnoses:  None    Rx / DC Orders ED Discharge Orders    None       Bethann Berkshire, MD 06/04/20 1535

## 2020-06-04 NOTE — ED Provider Notes (Signed)
  Physical Exam  BP (!) 151/105 (BP Location: Right Arm)   Pulse 72   Temp 98.3 F (36.8 C) (Oral)   Resp 18   Ht 4\' 11"  (1.499 m)   Wt 70.8 kg   SpO2 100%   BMI 31.51 kg/m   Physical Exam  ED Course/Procedures     Procedures  MDM  Received patient in signout.  Psychiatrically has been seen and recommend reevaluation oral.  Medically clear now.  2 negative  troponins.       , MD 06/04/20 2110

## 2020-06-04 NOTE — ED Notes (Signed)
Patient was wanded in triage, as well as after dressing out in purple scrubs. Urine sample was given, patient belongings were put on bottom shelf in locker room, (2 bags)
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
## 2020-06-04 NOTE — ED Triage Notes (Addendum)
Pt c/o depression and states she does not want to live anymore.  Pt states she has access to pills and weapons.  Pt stopped taking her antidepressants 2 months ago because she "just doesn't care anymore."  Pt states she had a plan to OD on her blood pressure pills last night, but did not because of family. Pt states she has not showered in 4 days.  Pt states "I'll be honest with you, if I had a gun I would blow my brains out."

## 2020-06-05 ENCOUNTER — Encounter (HOSPITAL_COMMUNITY): Payer: Self-pay | Admitting: Psychiatry

## 2020-06-05 ENCOUNTER — Other Ambulatory Visit: Payer: Self-pay

## 2020-06-05 ENCOUNTER — Encounter (HOSPITAL_COMMUNITY): Payer: Self-pay

## 2020-06-05 ENCOUNTER — Inpatient Hospital Stay (HOSPITAL_COMMUNITY)
Admission: RE | Admit: 2020-06-05 | Discharge: 2020-06-10 | DRG: 885 | Disposition: A | Discharge status: Home / Self Care | Payer: Medicare Other | Attending: Psychiatry | Admitting: Psychiatry

## 2020-06-05 DIAGNOSIS — I10 Essential (primary) hypertension: Secondary | ICD-10-CM | POA: Diagnosis present

## 2020-06-05 DIAGNOSIS — F603 Borderline personality disorder: Secondary | ICD-10-CM | POA: Diagnosis present

## 2020-06-05 DIAGNOSIS — F32A Depression, unspecified: Secondary | ICD-10-CM | POA: Diagnosis not present

## 2020-06-05 DIAGNOSIS — F17210 Nicotine dependence, cigarettes, uncomplicated: Secondary | ICD-10-CM | POA: Diagnosis present

## 2020-06-05 DIAGNOSIS — R0789 Other chest pain: Secondary | ICD-10-CM | POA: Diagnosis present

## 2020-06-05 DIAGNOSIS — R45851 Suicidal ideations: Secondary | ICD-10-CM | POA: Diagnosis present

## 2020-06-05 DIAGNOSIS — F1420 Cocaine dependence, uncomplicated: Secondary | ICD-10-CM | POA: Diagnosis present

## 2020-06-05 DIAGNOSIS — F1491 Cocaine use, unspecified, in remission: Secondary | ICD-10-CM

## 2020-06-05 DIAGNOSIS — F319 Bipolar disorder, unspecified: Secondary | ICD-10-CM | POA: Diagnosis present

## 2020-06-05 DIAGNOSIS — Z87898 Personal history of other specified conditions: Secondary | ICD-10-CM

## 2020-06-05 DIAGNOSIS — Z20822 Contact with and (suspected) exposure to covid-19: Secondary | ICD-10-CM | POA: Diagnosis present

## 2020-06-05 DIAGNOSIS — F1910 Other psychoactive substance abuse, uncomplicated: Secondary | ICD-10-CM | POA: Diagnosis present

## 2020-06-05 DIAGNOSIS — R12 Heartburn: Secondary | ICD-10-CM | POA: Diagnosis present

## 2020-06-05 LAB — RESP PANEL BY RT-PCR (FLU A&B, COVID) ARPGX2
Influenza A by PCR: NEGATIVE
Influenza B by PCR: NEGATIVE
SARS Coronavirus 2 by RT PCR: NEGATIVE

## 2020-06-05 MED ORDER — INFLUENZA VAC SPLIT QUAD 0.5 ML IM SUSY
0.5000 mL | PREFILLED_SYRINGE | INTRAMUSCULAR | Status: DC
  Filled 2020-06-05: qty 0.5

## 2020-06-05 MED ORDER — ACETAMINOPHEN 325 MG PO TABS
650.0000 mg | ORAL_TABLET | Freq: Four times a day (QID) | ORAL | Status: DC | PRN
  Administered 2020-06-10 (×2): 650 mg via ORAL
  Filled 2020-06-05 (×3): qty 2

## 2020-06-05 MED ORDER — MAGNESIUM HYDROXIDE 400 MG/5ML PO SUSP
30.0000 mL | Freq: Every day | ORAL | Status: DC | PRN
  Filled 2020-06-05: qty 30

## 2020-06-05 MED ORDER — ALUM & MAG HYDROXIDE-SIMETH 200-200-20 MG/5ML PO SUSP
30.0000 mL | ORAL | Status: DC | PRN
  Administered 2020-06-09 – 2020-06-10 (×2): 30 mL via ORAL
  Filled 2020-06-05: qty 30

## 2020-06-05 MED ORDER — HYDROXYZINE HCL 25 MG PO TABS
25.0000 mg | ORAL_TABLET | Freq: Three times a day (TID) | ORAL | Status: DC | PRN
  Administered 2020-06-05 – 2020-06-08 (×3): 25 mg via ORAL
  Filled 2020-06-05 (×3): qty 1

## 2020-06-05 MED ORDER — PNEUMOCOCCAL VAC POLYVALENT 25 MCG/0.5ML IJ INJ
0.5000 mL | INJECTION | INTRAMUSCULAR | Status: DC
  Filled 2020-06-05: qty 0.5

## 2020-06-05 MED ORDER — TRAZODONE HCL 50 MG PO TABS
50.0000 mg | ORAL_TABLET | Freq: Every evening | ORAL | Status: DC | PRN
  Administered 2020-06-05 – 2020-06-09 (×5): 50 mg via ORAL
  Filled 2020-06-05 (×5): qty 1

## 2020-06-05 NOTE — ED Notes (Signed)
Pt sleeping. 
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
## 2020-06-05 NOTE — Progress Notes (Signed)
Pt accepted to  [facility], [address], [place], [address]   Dr. Verdell Face is the attending provider.    Call report to  [phone]    Nichole at [facility] notified.     Pt is  Voluntary.    Pt may be transported by [facility], [phone]  Pt scheduled  to arrive at [facility] after 9pm.   Ladoris Gene MSW,LCSWA,LCASA Clinical Social Worker  [place] Disposition [phone] (cell)

## 2020-06-05 NOTE — ED Notes (Signed)
Pt stuffed paper towels down the toilet.

## 2020-06-05 NOTE — Tx Team (Signed)
Initial Treatment Plan 06/05/2020 11:00 PM Connie [name]:[id number]    PATIENT STRESSORS: Financial difficulties Substance abuse   PATIENT STRENGTHS: General fund of knowledge Motivation for treatment/growth   PATIENT IDENTIFIED PROBLEMS: Major Depression  Suicidal ideation  Substance Abuse    Patient would like to work on Depression and suicidal thoughts             DISCHARGE CRITERIA:  Improved stabilization in mood, thinking, and/or behavior  PRELIMINARY DISCHARGE PLAN: Outpatient therapy  PATIENT/FAMILY INVOLVEMENT: This treatment plan has been presented to and reviewed with the patient, Connie [name], and/or family member, .  The patient and family have been given the opportunity to ask questions and make suggestions.  Shelia Media, RN 06/05/2020, 11:00 PM

## 2020-06-06 DIAGNOSIS — F603 Borderline personality disorder: Secondary | ICD-10-CM | POA: Diagnosis present

## 2020-06-06 DIAGNOSIS — F32A Depression, unspecified: Secondary | ICD-10-CM

## 2020-06-06 DIAGNOSIS — R45851 Suicidal ideations: Secondary | ICD-10-CM

## 2020-06-06 LAB — LIPID PANEL
Cholesterol: 152 mg/dL (ref 0–200)
HDL: 32 mg/dL — ABNORMAL LOW (ref >40)
LDL Cholesterol: 97 mg/dL (ref 0–99)
Total CHOL/HDL Ratio: 4.8 RATIO
Triglycerides: 115 mg/dL (ref <150)
VLDL: 23 mg/dL (ref 0–40)

## 2020-06-06 LAB — TSH: TSH: 2.521 u[IU]/mL (ref 0.350–4.500)

## 2020-06-06 LAB — HEMOGLOBIN A1C
Hgb A1c MFr Bld: 5.2 % (ref 4.8–5.6)
Mean Plasma Glucose: 102.54 mg/dL

## 2020-06-06 MED ORDER — QUETIAPINE FUMARATE ER 50 MG PO TB24
100.0000 mg | ORAL_TABLET | Freq: Every day | ORAL | Status: DC
  Administered 2020-06-06: 100 mg via ORAL
  Filled 2020-06-06 (×3): qty 2

## 2020-06-06 MED ORDER — BUPROPION HCL ER (XL) 150 MG PO TB24
150.0000 mg | ORAL_TABLET | Freq: Every day | ORAL | Status: DC
  Administered 2020-06-07: 150 mg via ORAL
  Filled 2020-06-06 (×2): qty 1

## 2020-06-06 MED ORDER — BUPROPION HCL ER (SR) 100 MG PO TB12: 100.0000 mg | ORAL_TABLET | Freq: Every morning | ORAL | Status: DC

## 2020-06-06 NOTE — [facility] Suicide Risk Assessment (Signed)
[facility] INPATIENT:  Family/Significant Other Suicide Prevention Education  Suicide Prevention Education:  Patient Refusal for Family/Significant Other Suicide Prevention Education: The patient Connie [name] has refused to provide written consent for family/significant other to be provided Family/Significant Other Suicide Prevention Education during admission and/or prior to discharge.  Physician notified.   CSW completed SPE with patient.  A pamphlet was placed in the patient's chart.   Met[place] Kung Eisen Robenson 06/06/2020, 10:10 AM

## 2020-06-06 NOTE — Progress Notes (Signed)
   06/06/20 0626  Vital Signs  Pulse Rate 89  BP 122/88  BP Location Right Arm  BP Method Automatic  Patient Position (if appropriate) Standing   D: Patient  Denies SI/HI/AVH. Pt. Rated anxiety 8/10 and depression 9/10. Pt. Isolated in her room and did not attend group. A:  Support and encouragement provided Routine safety checks conducted every 15 minutes. Patient  Informed to notify staff with any concerns.  R:  Safety maintained.

## 2020-06-06 NOTE — Progress Notes (Signed)
Recreation Therapy Notes  Animal-Assisted Activity (AAA) Program Checklist/Progress Notes Patient Eligibility Criteria Checklist & Daily Group note for Rec Tx Intervention  Date: 12.7.21 Time: 1430 Location: [address]   AAA/T Program Assumption of Risk Form signed by Engineer, production or Parent Legal Guardian  YES  Patient is free of allergies or severe asthma YES  Patient reports no fear of animals YES  Patient reports no history of cruelty to animals YES   Patient understands his/her participation is voluntary  YES  Patient washes hands before animal contact  YES   Patient washes hands after animal contact  YES  Education: Charity fundraiser, Appropriate Animal Interaction   Education Outcome: Acknowledges understanding/In group clarification offered/Needs additional education.   Clinical Observations/Feedback: Pt did not attend group session.    Caroll Rancher, LRT/CTRS         Caroll Rancher A 06/06/2020 4:04 PM

## 2020-06-06 NOTE — [facility] Group Notes (Signed)
Adult Psychoeducational Group Note  Date:  06/06/2020 Time:  10:37 AM  Group Topic/Focus:  Goals Group:   The focus of this group is to help patients establish daily goals to achieve during treatment and discuss how the patient can incorporate goal setting into their daily lives to aide in recovery.  Participation Level:  Did Not Attend  Additional Comments:  Pt did not attend morning goals group.  Connie [name] 06/06/2020, 10:37 AM
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
## 2020-06-06 NOTE — H&P (Signed)
Psychiatric Admission Assessment Adult  Patient Identification: Connie [name] MRN:  [id number] Date of Evaluation:  06/06/2020 Chief Complaint:  Bipolar 1 disorder, depressed (HCC) [F31.9] Principal Diagnosis: Depression with suicidal ideation Diagnosis:  Principal Problem:   Depression with suicidal ideation Active Problems:   Drug abuse (HCC)   Bipolar 1 disorder, depressed (HCC)   Borderline personality disorder in adult [facility])  History of Present Illness: Connie [name] is a 29 yo female with a hx of Bipolar 1 Disorder, Borderline Personality Disorder, and Cocaine Use Disorder who presented voluntarily to [facility] for SI. According to [facility] assessment on 12/5, "Pt is a 29 year old female who presented to [facility] on a voluntary basis with compliant of suicidal ideation and other symptoms.  Pt lives in [place] with her cousin, and she said that she is on disability due to Bipolar I Disorder.  Pt stated that she receives outpatient psychiatric services through [facility] location (''but I haven't been there in a while.'').    Pt reported that she has a history of depression.  Pt reported that over the last two weeks, she has felt suicidal with a plan to overdose on prescribed medication and OTC meds.  Pt explained that she feels suicidal because of the death of her boyfriend in 17-Jun-2021and also the death of her mother several years ago.  Pt endorsed the following symptoms:  Persistent despondency; suicidal ideation with plan; one past suicide attempt several years ago; insomnia; feelings of hopelessness and worthlessness.  [name] -- Pt stated that she has not bathed in about four days.    Pt also endorsed weekly use of crack cocaine (varied amount, 3-5 x a week). Last use was 4 days ago. Pt denied homicidal ideation, hallucination, and self-injurious behavior.  Per report, Pt had an intentional overdose in June 2021.  Pt reported  that she has had inpatient treatment on several occasions, and that her last inpatient treatment for depression was in 06/17/21at [facility].  Pt stated that she is not sure what sort of help she would like to have today.  When asked, Pt declined to plan for safety, stating she thinks that if she left the hospital, she would not be safe."   On Exam Today: Connie [name] was found resting in her bed with the lights off. She reports that over the few months she has had worsening feelings of hopelessness and decreased mood following the death of her boyfriend in 2022/12/16 of this year. During this time, she reports worsening hypersomnia, hyperphagia, weight gain, decreased energy, decreased concentration, feelings of worthlessness. More recently over the past 2 weeks she has begun having suicidal thoughts and notes a plan to overdose on her medication. She did not have a specific medication that she planned on using, but reports planning to take anything she could find.  She states that she has been in the hospital " 30 or 40 times" for similar circumstances but is unable to provide specifics about what happened or any treatments that helped improve her mood. She states that she had a suicide attempt 1-2 years ago but cannot recall the specifics at the time. Of note she was admitted to [facility], as noted above, for a suicide attempt in 12/16/2022 of this year.  The patient reports that shortly after her boyfriend died she stopped taking her abilify because it made her feel tired and depressed. Since then she has not been on any mood  stabilizing or antidepressant medications. Additionally she states that Lithium and Depakote "make me psychotic."  She states that she was living with her cousin since her boyfriend's death but that they had a fall out because her cousin was bossing her around and becoming annoying so the pt moved out. Since then she has been homeless and sleeping on different people's couches. She notes  that she does not have any friends because people are only her friends when she has money, and when she doesn't they leave her. The only family member she was close to was her mother who died 2 years ago. Since then, the pt states that she has felt very depressed and alone.  Social history includes reaching 8th grade but then dropping out because she is, "not smart. And I don't understand how to do things." She notes that throughout her school years she frequently got in fights with other kids. She does not currently work and recalls that she tried to get a job at 29 years old but was overcome by anxiety at the time and has not been able to have a job since then. She currently has two children but they have been adopted because she states she was an unfit mother.  She currently denies and HI or thoughts of self harm but does endorse suicidal ideation, stating that she is afraid of what she might do if she leaves.  During the exam, the patient was lying in bed in casual clothes fairly well groomed. Her speech was normal rate, tone, and volume. Her thought process was coherent, logical, and linear. She endorsed thoughts of worthlessness but is interested in improving her situation and controlling her depression. Denies symptoms of paranoia or delusions. Her affect was slightly dysphoric but perhaps not appropriately reactive to topics such as speaking of her children being adopted or her boyfriend and mother's deaths.   Associated Signs/Symptoms: Depression Symptoms:  depressed mood, anhedonia, hypersomnia, psychomotor retardation, feelings of worthlessness/guilt, difficulty concentrating, suicidal thoughts with specific plan, loss of energy/fatigue, weight gain, increased appetite, Duration of Depression Symptoms: Greater than two weeks  (Hypo) Manic Symptoms:  None at this time. Anxiety Symptoms:  Panic Symptoms, Psychotic Symptoms:  None at this time. Duration of Psychotic Symptoms: No data  recorded PTSD Symptoms: Re-experiencing:  Flashbacks Intrusive Thoughts regarding seeing her mother and boyfriend's deaths Total Time spent with patient: 30 minutes  Past Psychiatric History: Bipolar 1 Disorder, Borderline Personality Disorder  Is the patient at risk to self? Yes.    Has the patient been a risk to self in the past 6 months? Yes.    Has the patient been a risk to self within the distant past? Yes.    Is the patient a risk to others? No.  Has the patient been a risk to others in the past 6 months? No.  Has the patient been a risk to others within the distant past? No.   Prior Inpatient Therapy:   Prior Outpatient Therapy:    Alcohol Screening: 1. How often do you have a drink containing alcohol?: Never 2. How many drinks containing alcohol do you have on a typical day when you are drinking?: 1 or 2 3. How often do you have six or more drinks on one occasion?: Never AUDIT-C Score: 0 4. How often during the last year have you found that you were not able to stop drinking once you had started?: Never 5. How often during the last year have you failed to do what  was normally expected from you because of drinking?: Never 6. How often during the last year have you needed a first drink in the morning to get yourself going after a heavy drinking session?: Never 7. How often during the last year have you had a feeling of guilt of remorse after drinking?: Never 8. How often during the last year have you been unable to remember what happened the night before because you had been drinking?: Never 9. Have you or someone else been injured as a result of your drinking?: No 10. Has a relative or friend or a doctor or another health worker been concerned about your drinking or suggested you cut down?: No Alcohol Use Disorder Identification Test Final Score (AUDIT): 0 Alcohol Brief Interventions/Follow-up: AUDIT Score <7 follow-up not indicated Substance Abuse History in the last 12  months:  Yes.   Consequences of Substance Abuse: NA Previous Psychotropic Medications: Yes  Psychological Evaluations: Yes  Past Medical History:  Past Medical History:  Diagnosis Date  . Abnormal Pap smear   . Bipolar 1 disorder (HCC)   . Bipolar disorder (HCC)   . BV (bacterial vaginosis) 03/11/2013  . Carpal tunnel syndrome on both sides    with preg  . Depression   . GERD (gastroesophageal reflux disease)   . Headache(784.0)   . Hypertension   . Kidney stones   . Nausea 07/14/2015  . Obesity 06/17/2013  . Ovarian cyst, right   . Pregnancy induced hypertension   . Pregnant 07/14/2015  . Pregnant   . RLQ abdominal pain 03/11/2013  . Unspecified symptom associated with female genital organs 01/06/2014  . Urinary tract infection   . Vaginal discharge 03/11/2013    Past Surgical History:  Procedure Laterality Date  . MYRINGOTOMY    . TONSILLECTOMY    . TONSILLECTOMY AND ADENOIDECTOMY Bilateral   . TUBAL LIGATION N/A 02/10/2016   Procedure: POST PARTUM TUBAL LIGATION;  Surgeon: Willodean Rosenthal, MD;  Location: [facility];  Service: Gynecology;  Laterality: N/A;   Family History:  Family History  Problem Relation Age of Onset  . Hypertension Mother   . Heart attack Mother   . Hypertension Father   . Diabetes Father   . Diabetes Maternal Grandfather   . Diabetes Paternal Grandmother   . Other Son        swallowing problems  . Hypertension Maternal Aunt   . Hypertension Maternal Uncle   . Hypertension Paternal Aunt   . Hypertension Paternal Uncle    Family Psychiatric  History: Bipolar Disorder in mother, grandfather, aunt, and sister Tobacco Screening: Have you used any form of tobacco in the last 30 days? (Cigarettes, Smokeless Tobacco, Cigars, and/or Pipes): Yes Tobacco use, Select all that apply: 5 or more cigarettes per day Are you interested in Tobacco Cessation Medications?: Yes, will notify MD for an order Counseled patient on smoking cessation including  recognizing danger situations, developing coping skills and basic information about quitting provided: Yes Social History:  Social History   Substance and Sexual Activity  Alcohol Use No     Social History   Substance and Sexual Activity  Drug Use Yes  . Frequency: 5.0 times per week  . Types: Cocaine, "Crack" cocaine   Comment: pt states she last smoked crack 06/02/20    Additional Social History: Marital status: Single Are you sexually active?: Yes What is your sexual orientation?: Heterosexual Has your sexual activity been affected by drugs, alcohol, medication, or emotional stress?: No Does patient have children?:  Yes How many children?: 2 How is patient's relationship with their children?: "DSS took them 4 years ago so I dont see or talk to them anymore"                         Allergies:   Allergies  Allergen Reactions  . Sulfa Antibiotics Other (See Comments)    Chest pains  . Bactrim [Sulfamethoxazole-Trimethoprim] Other (See Comments)    Chest pain   . Hydrocodone Itching and Other (See Comments)    Chest pains   Lab Results:  Results for orders placed or performed during the hospital encounter of 06/05/20 (from the past 48 hour(s))  Hemoglobin A1c     Status: None   Collection Time: 06/06/20  7:00 AM  Result Value Ref Range   Hgb A1c MFr Bld 5.2 4.8 - 5.6 %    Comment: (NOTE) Pre diabetes:          5.7%-6.4%  Diabetes:              >6.4%  Glycemic control for   <7.0% adults with diabetes    Mean Plasma Glucose 102.54 mg/dL    Comment: Performed at [facility], [address] [place], [place] [address]  Lipid panel     Status: Abnormal   Collection Time: 06/06/20  7:00 AM  Result Value Ref Range   Cholesterol 152 0 - 200 mg/dL   Triglycerides 829 <562 mg/dL   HDL 32 (L) >13 mg/dL   Total CHOL/HDL Ratio 4.8 RATIO   VLDL 23 0 - 40 mg/dL   LDL Cholesterol 97 0 - 99 mg/dL    Comment:        Total Cholesterol/HDL:CHD Risk Coronary  Heart Disease Risk Table                     Men   Women  1/2 Average Risk   3.4   3.3  Average Risk       5.0   4.4  2 X Average Risk   9.6   7.1  3 X Average Risk  23.4   11.0        Use the calculated Patient Ratio above and the CHD Risk Table to determine the patient's CHD Risk.        ATP III CLASSIFICATION (LDL):  <100     mg/dL   Optimal  086-578  mg/dL   Near or Above                    Optimal  130-159  mg/dL   Borderline  469-629  mg/dL   High  >528     mg/dL   Very High Performed at [facility], [address], [place], [place] [address]   TSH     Status: None   Collection Time: 06/06/20  7:00 AM  Result Value Ref Range   TSH 2.521 0.350 - 4.500 uIU/mL    Comment: Performed by a 3rd Generation assay with a functional sensitivity of <=0.01 uIU/mL. Performed at [facility], [address], [place], [place] [address]     Blood Alcohol level:  Lab Results  Component Value Date   [facility] <10 06/04/2020   ETH <11 07/25/2012    Metabolic Disorder Labs:  Lab Results  Component Value Date   HGBA1C 5.2 06/06/2020   MPG 102.54 06/06/2020   No results found for: PROLACTIN  Lab Results  Component Value Date   CHOL 152 06/06/2020   TRIG 115 06/06/2020   HDL 32 (L) 06/06/2020   CHOLHDL 4.8 06/06/2020   VLDL 23 06/06/2020   LDLCALC 97 06/06/2020    Current Medications: Current Facility-Administered Medications  Medication Dose Route Frequency Provider Last Rate Last Admin  . acetaminophen (TYLENOL) tablet 650 mg  650 mg Oral Q6H PRN Nira Conn A, NP      . alum & mag hydroxide-simeth (MAALOX/MYLANTA) 200-200-20 MG/5ML suspension 30 mL  30 mL Oral Q4H PRN Nira Conn A, NP      . hydrOXYzine (ATARAX/VISTARIL) tablet 25 mg  25 mg Oral TID PRN Jackelyn Poling, NP   25 mg at 06/05/20 2332  . influenza vac split quadrivalent PF (FLUARIX) injection 0.5 mL  0.5 mL Intramuscular Tomorrow-1000 Jeanna Giuffre A, MD      . magnesium  hydroxide (MILK OF MAGNESIA) suspension 30 mL  30 mL Oral Daily PRN Nira Conn A, NP      . pneumococcal 23 valent vaccine (PNEUMOVAX-23) injection 0.5 mL  0.5 mL Intramuscular Tomorrow-1000 Brooke Steinhilber A, MD      . traZODone (DESYREL) tablet 50 mg  50 mg Oral QHS PRN Jackelyn Poling, NP   50 mg at 06/05/20 2332   PTA Medications: Medications Prior to Admission  Medication Sig Dispense Refill Last Dose  . amLODipine (NORVASC) 5 MG tablet Take 5 mg by mouth daily.     Marland Kitchen ibuprofen (ADVIL) 200 MG tablet Take 400 mg by mouth every 6 (six) hours as needed.     Marland Kitchen omeprazole (PRILOSEC) 20 MG capsule Take 20 mg by mouth daily.     Marland Kitchen oxcarbazepine (TRILEPTAL) 600 MG tablet Take 600 mg by mouth 2 (two) times daily. (Patient not taking: Reported on 06/04/2020)   Not Taking at Unknown time  . traZODone (DESYREL) 150 MG tablet Take by mouth at bedtime. (Patient not taking: Reported on 06/04/2020)   Not Taking at Unknown time  . zolpidem (AMBIEN) 10 MG tablet Take 10 mg by mouth at bedtime as needed for sleep. (Patient not taking: Reported on 06/04/2020)   Not Taking at Unknown time    Musculoskeletal: Strength & Muscle Tone: within normal limits Gait & Station: Unassessed as patient was lying down Patient leans: N/A  Psychiatric Specialty Exam: Physical Exam Constitutional:      Appearance: Normal appearance.  HENT:     Head: Normocephalic and atraumatic.  Eyes:     Extraocular Movements: Extraocular movements intact.  Pulmonary:     Effort: Pulmonary effort is normal.  Musculoskeletal:        General: Normal range of motion.  Skin:    General: Skin is dry.  Neurological:     General: No focal deficit present.     Mental Status: She is alert and oriented to person, place, and time.     Review of Systems  Constitutional: Positive for appetite change.  Eyes: Negative for visual disturbance.  Respiratory: Negative for cough and shortness of breath.   Cardiovascular: Positive for chest  pain. Negative for palpitations and leg swelling.  Gastrointestinal: Positive for constipation (Reports having intermittent constipation). Negative for diarrhea.  Musculoskeletal: Negative for arthralgias and myalgias.  Neurological: Positive for headaches.  Psychiatric/Behavioral: Positive for sleep disturbance and suicidal ideas.    Blood pressure 122/88, pulse 89, temperature 98 F (36.7 C), temperature source Oral, resp. rate 18, height 4\' 11"  (1.499 m), weight 70.3 kg, last menstrual period  06/01/2020, SpO2 99 %.Body mass index is 31.31 kg/m.  General Appearance: Casual and Fairly Groomed  Eye Contact:  Good  Speech:  Clear and Coherent and Normal Rate  Volume:  Normal  Mood:  Dysphoric  Affect:  Depressed  Thought Process:  Coherent and Linear  Orientation:  Full (Time, Place, and Person)  Thought Content:  Logical  Suicidal Thoughts:  Yes.  with intent/plan  Homicidal Thoughts:  No  Memory:  Immediate;   Good Recent;   Fair Remote;   Good  Judgement:  Poor  Insight:  Lacking  Psychomotor Activity:  Psychomotor Retardation  Concentration:  Concentration: Good  Recall:  Good  Fund of Knowledge:  Good  Language:  Good  Akathisia:  No  Handed:  Right  AIMS (if indicated):     Assets:  Communication Skills Desire for Improvement Physical Health  ADL's:  Intact  Cognition:  WNL  Sleep:  Number of Hours: 5    Treatment Plan Summary: Daily contact with patient to assess and evaluate symptoms and progress in treatment and medication management.  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 This is in the context of her boyfriend's death in May and mother's death 2 years ago and is perpetuated by poor coping skills, volatile/poor support network, and daily crack cocaine use. Protective factors include her desire  for improvement, communication skills, and general good health. Will start Wellbutrin and Seroquel for mood stability and improvements in sleep quality and energy level during the day. Given lack of stable home and active recent crack cocaine use, will investigate possible placement at rehab facility at discharge. Additionally, encouraged pt to get involved in group therapy sessions on the unit and trying to interact with other individuals on the unit.  -Start Wellbutrin 100 mg qAM -Start Seroquel 50 mg tonight with plan to increase tomorrow as tolerated  -Continue: Trazadone 50 mg QHS for sleep Norvasc 5 mg QD for HTN Prilosec 20 mg QD    Observation Level/Precautions:  15 minute checks  Laboratory:  None at this time.  Psychotherapy:    Medications:    Consultations:    Discharge Concerns:    Estimated LOS: 3-7 days  Other:     Physician Treatment Plan for Primary Diagnosis: Depression with suicidal ideation Long Term Goal(s): Improvement in symptoms so as ready for discharge  Short Term Goals: Ability to identify changes in lifestyle to reduce recurrence of condition will improve, Ability to verbalize feelings will improve, Ability to disclose and discuss suicidal ideas, Ability to demonstrate self-control will improve, Ability to identify and develop effective coping behaviors will improve, Compliance with prescribed medications will improve and Ability to identify triggers associated with substance abuse/mental health issues will improve  Physician Treatment Plan for Secondary Diagnosis: Principal Problem:   Depression with suicidal ideation Active Problems:   Drug abuse (HCC)   Bipolar 1 disorder, depressed (HCC)   Borderline personality disorder in adult [facility](HCC)  Long Term Goal(s): Improvement in symptoms so as ready for discharge  Short Term Goals: Ability to identify changes in lifestyle to reduce recurrence of condition will improve, Ability to verbalize feelings will  improve, Ability to disclose and discuss suicidal ideas, Ability to demonstrate self-control will improve, Ability to identify and develop effective coping behaviors will improve, Ability to maintain clinical measurements within normal limits will improve  and Ability to identify triggers associated with substance abuse/mental health issues will improve  I certify that inpatient services furnished can reasonably be expected to improve the patient's condition.    Nonah Mattes, Medical Student 12/7/20212:01 PM

## 2020-06-06 NOTE — Progress Notes (Signed)
   06/06/20 2000  Psych Admission Type (Psych Patients Only)  Admission Status Voluntary  Psychosocial Assessment  Patient Complaints Depression  Eye Contact Fair  Facial Expression Flat;Sad  Affect Depressed;Sad;Flat  Speech Logical/coherent  Interaction Assertive  Motor Activity Other (Comment) (unremarkable)  Appearance/Hygiene In scrubs  Behavior Characteristics Cooperative  Mood Depressed  Thought Process  Coherency WDL  Content WDL  Delusions None reported or observed  Perception WDL  Hallucination None reported or observed  Judgment Poor  Confusion None  Danger to Self  Current suicidal ideation? Passive  Self-Injurious Behavior No self-injurious ideation or behavior indicators observed or expressed   Agreement Not to Harm Self Yes  Description of Agreement verbal contract for safety not to harm self  Danger to Others  Danger to Others None reported or observed

## 2020-06-07 MED ORDER — BUPROPION HCL ER (XL) 300 MG PO TB24
300.0000 mg | ORAL_TABLET | Freq: Every day | ORAL | Status: DC
  Administered 2020-06-08 – 2020-06-10 (×3): 300 mg via ORAL
  Filled 2020-06-07 (×5): qty 1

## 2020-06-07 MED ORDER — QUETIAPINE FUMARATE 200 MG PO TABS
200.0000 mg | ORAL_TABLET | Freq: Every day | ORAL | Status: DC
  Administered 2020-06-07 – 2020-06-08 (×2): 200 mg via ORAL
  Filled 2020-06-07 (×3): qty 1

## 2020-06-07 NOTE — Progress Notes (Signed)
Psychoeducational Group Note  Date:  06/07/2020 Time:  1108  Group Topic/Focus:  Self Esteem Action Plan:   The focus of this group is to help patients create a plan to continue to build self-esteem after discharge.  Participation Level: Did Not Attend  Participation Quality:  Not Applicable  Affect:  Not Applicable  Cognitive:  Not Applicable  Insight:  Not Applicable  Engagement in Group: Not Applicable  Additional Comments:  Pt was asleep and could not attend group this morning.  Yuritzy Zehring E 06/07/2020, 11:14 AM

## 2020-06-07 NOTE — Plan of Care (Signed)
Nurse discussed anxiety, depression and coping skills with patient.  

## 2020-06-07 NOTE — Progress Notes (Signed)
D:  Patient's self inventory sheet, patient has poor sleep, sleep medication not helpful.  Good appetite, low energy level, poor concentration.  Rated depression 4, hopeless 3, anxiety 2.  Denied withdrawals.  Denied SI.  Physical problems, stomach pain,.  Goal is attend groups, shower.  Does have discharge plans. A:  Medications administered per MD orders.  Emotional support and encouragement given patient. R:  Denied SI and HI, contracts for safety.  Denied A/V hallucinations.  Safety maintained with 15 minute checks.

## 2020-06-07 NOTE — [facility] Group Notes (Signed)
[employer] LCSW Group Therapy  06/07/2020 1:38 PM  Type of Therapy:  Group Therapy: Goal Setting and Motivation  Participation Level:  Did Not Attend   Felizardo Hoffmann 06/07/2020, 1:38 PM

## 2020-06-07 NOTE — Tx Team (Signed)
Interdisciplinary Treatment and Diagnostic Plan Update  06/07/2020 Time of Session: 9:00am  Connie [name] MRN: [id number]  Principal Diagnosis: Depression with suicidal ideation  Secondary Diagnoses: Principal Problem:   Depression with suicidal ideation Active Problems:   Drug abuse (HCC)   Bipolar 1 disorder, depressed (HCC)   Borderline personality disorder in adult [facility])   Current Medications:  Current Facility-Administered Medications  Medication Dose Route Frequency Provider Last Rate Last Admin  . acetaminophen (TYLENOL) tablet 650 mg  650 mg Oral Q6H PRN Nira Conn A, NP      . alum & mag hydroxide-simeth (MAALOX/MYLANTA) 200-200-20 MG/5ML suspension 30 mL  30 mL Oral Q4H PRN Nira Conn A, NP      . buPROPion (WELLBUTRIN XL) 24 hr tablet 150 mg  150 mg Oral Daily Cristofano, Worthy Rancher, MD   150 mg at 06/07/20 0741  . hydrOXYzine (ATARAX/VISTARIL) tablet 25 mg  25 mg Oral TID PRN Jackelyn Poling, NP   25 mg at 06/05/20 2332  . influenza vac split quadrivalent PF (FLUARIX) injection 0.5 mL  0.5 mL Intramuscular Tomorrow-1000 Cristofano, Paul A, MD      . magnesium hydroxide (MILK OF MAGNESIA) suspension 30 mL  30 mL Oral Daily PRN Nira Conn A, NP      . pneumococcal 23 valent vaccine (PNEUMOVAX-23) injection 0.5 mL  0.5 mL Intramuscular Tomorrow-1000 Cristofano, Paul A, MD      . QUEtiapine (SEROQUEL XR) 24 hr tablet 100 mg  100 mg Oral QHS Cristofano, Paul A, MD   100 mg at 06/06/20 2110  . traZODone (DESYREL) tablet 50 mg  50 mg Oral QHS PRN Jackelyn Poling, NP   50 mg at 06/06/20 2110   PTA Medications: Medications Prior to Admission  Medication Sig Dispense Refill Last Dose  . amLODipine (NORVASC) 5 MG tablet Take 5 mg by mouth daily.     Marland Kitchen ibuprofen (ADVIL) 200 MG tablet Take 400 mg by mouth every 6 (six) hours as needed.     Marland Kitchen omeprazole (PRILOSEC) 20 MG capsule Take 20 mg by mouth daily.     Marland Kitchen oxcarbazepine (TRILEPTAL) 600 MG tablet Take 600 mg by mouth 2 (two)  times daily. (Patient not taking: Reported on 06/04/2020)   Not Taking at Unknown time  . traZODone (DESYREL) 150 MG tablet Take by mouth at bedtime. (Patient not taking: Reported on 06/04/2020)   Not Taking at Unknown time  . zolpidem (AMBIEN) 10 MG tablet Take 10 mg by mouth at bedtime as needed for sleep. 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   Physician Treatment Plan for Primary Diagnosis: Depression with suicidal ideation Long Term Goal(s): Improvement in symptoms so as ready for discharge Improvement in symptoms so as ready for discharge   Short Term Goals: Ability to identify changes in lifestyle to reduce recurrence of condition will improve Ability to verbalize feelings will improve Ability to disclose and discuss suicidal ideas Ability to demonstrate self-control will improve Ability to identify and develop effective coping behaviors will improve Compliance with prescribed medications will improve Ability to identify triggers associated with substance abuse/mental health issues will improve Ability to identify changes in lifestyle to reduce recurrence of condition will improve Ability to verbalize feelings will  improve Ability to disclose and discuss suicidal ideas Ability to demonstrate self-control will improve Ability to identify and develop effective coping behaviors will improve Ability to maintain clinical measurements within normal limits will improve Ability to identify triggers associated with substance abuse/mental health issues will improve  Medication Management: Evaluate patient's response, side effects, and tolerance of medication  regimen.  Therapeutic Interventions: 1 to 1 sessions, Unit Group sessions and Medication administration.  Evaluation of Outcomes: Progressing  Physician Treatment Plan for Secondary Diagnosis: Principal Problem:   Depression with suicidal ideation Active Problems:   Drug abuse (HCC)   Bipolar 1 disorder, depressed (HCC)   Borderline personality disorder in adult [facility])  Long Term Goal(s): Improvement in symptoms so as ready for discharge Improvement in symptoms so as ready for discharge   Short Term Goals: Ability to identify changes in lifestyle to reduce recurrence of condition will improve Ability to verbalize feelings will improve Ability to disclose and discuss suicidal ideas Ability to demonstrate self-control will improve Ability to identify and develop effective coping behaviors will improve Compliance with prescribed medications will improve Ability to identify triggers associated with substance abuse/mental health issues will improve Ability to identify changes in lifestyle to reduce recurrence of condition will improve Ability to verbalize feelings will improve Ability to disclose and discuss suicidal ideas Ability to demonstrate self-control will improve Ability to identify and develop effective coping behaviors will improve Ability to maintain clinical measurements within normal limits will improve Ability to identify triggers associated with substance abuse/mental health issues will improve     Medication Management: Evaluate patient's response, side effects, and tolerance of medication regimen.  Therapeutic Interventions: 1 to 1 sessions, Unit Group sessions and Medication administration.  Evaluation of Outcomes: Progressing   RN Treatment Plan for Primary Diagnosis: Depression with suicidal ideation Long Term Goal(s): Knowledge of disease and therapeutic regimen to maintain health will improve  Short Term Goals: Ability to remain free from injury will improve,  Ability to participate in decision making will improve, Ability to verbalize feelings will improve, Ability to disclose and discuss suicidal ideas and Ability to identify and develop effective coping behaviors will improve  Medication Management: RN will administer medications as ordered by provider, will assess and evaluate patient's response and provide education to patient for prescribed medication. RN will report any adverse and/or side effects to prescribing provider.  Therapeutic Interventions: 1 on 1 counseling sessions, Psychoeducation, Medication administration, Evaluate responses to treatment, Monitor vital signs and CBGs as ordered, Perform/monitor CIWA, COWS, AIMS and Fall Risk screenings as ordered, Perform wound care treatments as ordered.  Evaluation of Outcomes: Progressing   LCSW Treatment Plan for Primary Diagnosis: Depression with suicidal ideation Long Term Goal(s): Safe transition to appropriate next level of care at discharge, Engage patient in therapeutic group addressing interpersonal concerns.  Short Term Goals: Engage patient in aftercare planning with referrals and resources, Increase social support, Facilitate acceptance of mental health diagnosis and concerns, Facilitate patient progression through stages of change regarding substance use diagnoses and concerns, Identify triggers associated with mental health/substance abuse issues and Increase skills for wellness and recovery  Therapeutic Interventions: Assess for all discharge needs, 1 to 1 time with Social worker, Explore available resources and support systems, Assess for adequacy in community support network, Educate family and significant other(s) on suicide prevention, Complete Psychosocial Assessment, Interpersonal group therapy.  Evaluation of Outcomes: Progressing   Progress in Treatment: Attending groups: No. Participating in groups: No. Taking medication as prescribed: Yes. Toleration medication:  Yes. Family/Significant other contact made: No, will contact:  Declined Consents  Patient understands diagnosis: No. Discussing patient identified problems/goals with staff: Yes. Medical problems stabilized or resolved: Yes. Denies suicidal/homicidal ideation: Yes. Issues/concerns per patient self-inventory: No.   New problem(s) identified: No, Describe:  None   New Short Term/Long Term Goal(s): medication stabilization, elimination of SI thoughts, development of comprehensive mental wellness plan.   Patient Goals:  "To feel better"  Discharge Plan or Barriers: Patient recently admitted. CSW will continue to follow and assess for appropriate referrals and possible discharge planning.   Reason for Continuation of Hospitalization: Depression Medication stabilization Suicidal ideation Withdrawal symptoms  Estimated Length of Stay: 3 to 5 days   Attendees: Patient: Connie [name] 06/07/2020   Physician: Pricilla Larsson, MD 06/07/2020   Nursing:  06/07/2020   RN Care Manager: 06/07/2020   Social Worker: Melba Coon, LCSWA 06/07/2020   Recreational Therapist:  06/07/2020   Other:  06/07/2020   Other:  06/07/2020   Other: 06/07/2020     Scribe for Treatment Team: Aram Beecham, LCSWA 06/07/2020 10:16 AM

## 2020-06-07 NOTE — Progress Notes (Signed)
[facility]  06/07/2020 9:25 AM Edwena BlowBrandi L [name]  MRN:  [id number] Subjective:   Heide ScalesBrandi L [name] is a 29 yo female with a hx of Bipolar 1 Disorder, Borderline Personality Disorder, and Cocaine Use Disorder who presented voluntarily to [facility] for SI. [name] did not attend group therapy sessions yesterday. She spent most of the day in her room.  This morning, [name] was lying in bed throughout the interview. She reports sleeping poorly last night, constantly waking up. She estimates that she got no more than 4 hours of sleep and woke up feeling very tired this morning. This is unchanged from her sleep pattern prior to admission. She currently feels as though the Seroquel and Wellbutrin are not having any positive effect but is hopeful that they will. She believes that her mood is slightly improved since yesterday.  Unprompted, she stated that she plans to attend group sessions today.     Principal Problem: Depression with suicidal ideation Diagnosis: Principal Problem:   Depression with suicidal ideation Active Problems:   Drug abuse (HCC)   Bipolar 1 disorder, depressed (HCC)   Borderline personality disorder in adult [facility](HCC)  Total Time spent with patient: 15 minutes  Past Psychiatric History: Bipolar 1 Disorder, Borderline Personality Disorder  Past Medical History:  Past Medical History:  Diagnosis Date  . Abnormal Pap smear   . Bipolar 1 disorder (HCC)   . Bipolar disorder (HCC)   . BV (bacterial vaginosis) 03/11/2013  . Carpal tunnel syndrome on both sides    with preg  . Depression   . GERD (gastroesophageal reflux disease)   . Headache(784.0)   . Hypertension   . Kidney stones   . Nausea 07/14/2015  . Obesity 06/17/2013  . Ovarian cyst, right   . Pregnancy induced hypertension   . Pregnant 07/14/2015  . Pregnant   . RLQ abdominal pain 03/11/2013  . Unspecified symptom associated with female genital organs 01/06/2014  . Urinary tract infection   . Vaginal  discharge 03/11/2013    Past Surgical History:  Procedure Laterality Date  . MYRINGOTOMY    . TONSILLECTOMY    . TONSILLECTOMY AND ADENOIDECTOMY Bilateral   . TUBAL LIGATION N/A 02/10/2016   Procedure: POST PARTUM TUBAL LIGATION;  Surgeon: Willodean Rosenthalarolyn Harraway-Smith, MD;  Location: [facility];  Service: Gynecology;  Laterality: N/A;   Family History:  Family History  Problem Relation Age of Onset  . Hypertension Mother   . Heart attack Mother   . Hypertension Father   . Diabetes Father   . Diabetes Maternal Grandfather   . Diabetes Paternal Grandmother   . Other Son        swallowing problems  . Hypertension Maternal Aunt   . Hypertension Maternal Uncle   . Hypertension Paternal Aunt   . Hypertension Paternal Uncle    Family Psychiatric  History: Bipolar Disorder in mother, grandfather, aunt, and sister Social History:  Social History   Substance and Sexual Activity  Alcohol Use No     Social History   Substance and Sexual Activity  Drug Use Yes  . Frequency: 5.0 times per week  . Types: Cocaine, "Crack" cocaine   Comment: pt states she last smoked crack 06/02/20    Social History   Socioeconomic History  . Marital status: Single    Spouse name: Not on file  . Number of children: Not on file  . Years of education: Not on file  . Highest education level: Not on file  Occupational History  .  Not on file  Tobacco Use  . Smoking status: Current Every Day Smoker    Packs/day: 0.50    Years: 11.00    Pack years: 5.50    Types: Cigarettes  . Smokeless tobacco: Never Used  Vaping Use  . Vaping Use: Former  Substance and Sexual Activity  . Alcohol use: No  . Drug use: Yes    Frequency: 5.0 times per week    Types: Cocaine, "Crack" cocaine    Comment: pt states she last smoked crack 06/02/20  . Sexual activity: Yes    Birth control/protection: Surgical    Comment: tubal  Other Topics Concern  . Not on file  Social History Narrative   ** Merged History Encounter **        Social Determinants of Health   Financial Resource Strain:   . Difficulty of Paying Living Expenses: Not on file  Food Insecurity:   . Worried About Programme researcher, broadcasting/film/video in the Last Year: Not on file  . Ran Out of Food in the Last Year: Not on file  Transportation Needs:   . Lack of Transportation (Medical): Not on file  . Lack of Transportation (Non-Medical): Not on file  Physical Activity:   . Days of Exercise per Week: Not on file  . Minutes of Exercise per Session: Not on file  Stress:   . Feeling of Stress : Not on file  Social Connections:   . Frequency of Communication with Friends and Family: Not on file  . Frequency of Social Gatherings with Friends and Family: Not on file  . Attends Religious Services: Not on file  . Active Member of Clubs or Organizations: Not on file  . Attends Banker Meetings: Not on file  . Marital Status: Not on file   Additional Social History:                         Sleep: Poor  Appetite:  Good  Current Medications: Current Facility-Administered Medications  Medication Dose Route Frequency Provider Last Rate Last Admin  . acetaminophen (TYLENOL) tablet 650 mg  650 mg Oral Q6H PRN Nira Conn A, NP      . alum & mag hydroxide-simeth (MAALOX/MYLANTA) 200-200-20 MG/5ML suspension 30 mL  30 mL Oral Q4H PRN Nira Conn A, NP      . buPROPion (WELLBUTRIN XL) 24 hr tablet 150 mg  150 mg Oral Daily Manuel Dall, Worthy Rancher, MD   150 mg at 06/07/20 0741  . hydrOXYzine (ATARAX/VISTARIL) tablet 25 mg  25 mg Oral TID PRN Jackelyn Poling, NP   25 mg at 06/05/20 2332  . influenza vac split quadrivalent PF (FLUARIX) injection 0.5 mL  0.5 mL Intramuscular Tomorrow-1000 Javonni Macke A, MD      . magnesium hydroxide (MILK OF MAGNESIA) suspension 30 mL  30 mL Oral Daily PRN Nira Conn A, NP      . pneumococcal 23 valent vaccine (PNEUMOVAX-23) injection 0.5 mL  0.5 mL Intramuscular Tomorrow-1000 Ashlan Dignan A, MD      .  QUEtiapine (SEROQUEL XR) 24 hr tablet 100 mg  100 mg Oral QHS Kimbree Casanas A, MD   100 mg at 06/06/20 2110  . traZODone (DESYREL) tablet 50 mg  50 mg Oral QHS PRN Jackelyn Poling, NP   50 mg at 06/06/20 2110    Lab Results:  Results for orders placed or performed during the hospital encounter of 06/05/20 (from the past 48  hour(s))  Hemoglobin A1c     Status: None   Collection Time: 06/06/20  7:00 AM  Result Value Ref Range   Hgb A1c MFr Bld 5.2 4.8 - 5.6 %    Comment: (NOTE) Pre diabetes:          5.7%-6.4%  Diabetes:              >6.4%  Glycemic control for   <7.0% adults with diabetes    Mean Plasma Glucose 102.54 mg/dL    Comment: Performed at [facility], [address], [place], [place] [address]  Lipid panel     Status: Abnormal   Collection Time: 06/06/20  7:00 AM  Result Value Ref Range   Cholesterol 152 0 - 200 mg/dL   Triglycerides 326 <712 mg/dL   HDL 32 (L) >45 mg/dL   Total CHOL/HDL Ratio 4.8 RATIO   VLDL 23 0 - 40 mg/dL   LDL Cholesterol 97 0 - 99 mg/dL    Comment:        Total Cholesterol/HDL:CHD Risk Coronary Heart Disease Risk Table                     Men   Women  1/2 Average Risk   3.4   3.3  Average Risk       5.0   4.4  2 X Average Risk   9.6   7.1  3 X Average Risk  23.4   11.0        Use the calculated Patient Ratio above and the CHD Risk Table to determine the patient's CHD Risk.        ATP III CLASSIFICATION (LDL):  <100     mg/dL   Optimal  809-983  mg/dL   Near or Above                    Optimal  130-159  mg/dL   Borderline  382-505  mg/dL   High  >397     mg/dL   Very High Performed at [facility], [address] [place], [place] [address]   TSH     Status: None   Collection Time: 06/06/20  7:00 AM  Result Value Ref Range   TSH 2.521 0.350 - 4.500 uIU/mL    Comment: Performed by a 3rd Generation assay with a functional sensitivity of <=0.01 uIU/mL. Performed at [facility], [address] [place], [place] [address]     Blood Alcohol level:  Lab Results  Component Value Date   [facility] <10 06/04/2020   ETH <11 07/25/2012    Metabolic Disorder Labs: Lab Results  Component Value Date   HGBA1C 5.2 06/06/2020   MPG 102.54 06/06/2020   No results found for: PROLACTIN Lab Results  Component Value Date   CHOL 152 06/06/2020   TRIG 115 06/06/2020   HDL 32 (L) 06/06/2020   CHOLHDL 4.8 06/06/2020   VLDL 23 06/06/2020   LDLCALC 97 06/06/2020    Physical Findings: AIMS: Facial and Oral Movements Muscles of Facial Expression: None, normal Lips and Perioral Area: None, normal Jaw: None, normal Tongue: None, normal,Extremity Movements Upper (arms, wrists, hands, fingers): None, normal Lower (legs, knees, ankles, toes): None, normal, Trunk Movements Neck, shoulders, hips: None, normal, Overall Severity Severity of abnormal movements (highest score from questions above): None, normal Incapacitation due to abnormal movements: None, normal Patient's awareness of abnormal movements (rate only patient's report): No Awareness,  Dental Status Current problems with teeth and/or dentures?: Yes (patient states "ragedy") Does patient usually wear dentures?: No  CIWA:    COWS:     Musculoskeletal: Strength & Muscle Tone: within normal limits Gait & Station: Lying in bed Patient leans: N/A  Psychiatric Specialty Exam: Physical Exam Constitutional:      Appearance: Normal appearance.  HENT:     Head: Normocephalic and atraumatic.  Pulmonary:     Effort: Pulmonary effort is normal.  Abdominal:     General: Abdomen is flat.  Musculoskeletal:        General: Normal range of motion.  Neurological:     General: No focal deficit present.     Mental Status: She is alert and oriented to person, place, and time.     Review of Systems  Eyes: Negative for visual disturbance.  Respiratory: Negative for cough, chest tightness and shortness of breath.   Cardiovascular:  Negative for chest pain.  Gastrointestinal: Positive for abdominal pain (chronic, sees GI). Negative for constipation and diarrhea.  Musculoskeletal: Negative for arthralgias and myalgias.  Neurological: Negative for headaches.  Psychiatric/Behavioral: Positive for sleep disturbance. Negative for hallucinations.    Blood pressure 127/87, pulse 95, temperature 97.9 F (36.6 C), temperature source Oral, resp. rate 18, height 4\' 11"  (1.499 m), weight 70.3 kg, last menstrual period 06/01/2020, SpO2 99 %.Body mass index is 31.31 kg/m.  General Appearance: Casual and Fairly Groomed  Eye Contact:  Fair  Speech:  Clear and Coherent and Normal Rate  Volume:  Normal  Mood:  Dysphoric  Affect:  Congruent, Depressed and Flat  Thought Process:  Coherent and Linear  Orientation:  Full (Time, Place, and Person)  Thought Content:  Logical  Suicidal Thoughts:  No  Homicidal Thoughts:  No  Memory:  Recent;   Good  Judgement:  Fair   Insight:  Poor  Psychomotor Activity:  Psychomotor Retardation  Concentration:  Concentration: Good  Recall:  Good  Fund of Knowledge:  Good  Language:  Good  Akathisia:  No  Handed:  Right  AIMS (if indicated):     Assets:  Communication Skills Desire for Improvement Physical Health  ADL's:  Intact  Cognition:  WNL  Sleep:  Number of Hours: 5     Treatment Plan Summary: Daily contact with patient to assess and evaluate symptoms and progress in treatment and medication management.  SHALAWN WYNDER is a 29 yo female with a hx of Bipolar 1 disorder and established borderline personality disorder who presented with depression with SI. Some minimal improvement in reported mood today. No change in energy level or sleep pattern. Will increase Seroquel and continue to monitor for improved mood, sleep quality and length, as well as energy during the day. She expresses interest in group therapy today and has consistently spoken of improving control of her depression.  Provided positive feedback for interest in group sessions and encouraged active engagement. Pt is uninterested in attending Rehab at d/c.    Patient with continued low mood, poor sleep. Will continue treatment   -increase Wellbutrin to 300 mg qAM tomorrow -Increase Seroquel to 200 mg tonight  -Continue: Trazadone 50 mg QHS for sleep Norvasc 5 mg QD for HTN Prilosec 20 mg QD  24, Medical Student 06/07/2020, 9:25 AM

## 2020-06-08 NOTE — Progress Notes (Addendum)
[facility]  06/08/2020 6:59 AM Connie BlowBrandi L [name]  MRN:  [id number] Subjective:   Connie ScalesBrandi L [name] is a 29 yr old female who presented voluntarily to [facility] for SI. PPHx is significant for Bipolar 1 Disorder, Borderline Personality Disorder, and Cocaine Use Disorder  She reports that she slept poorly last night even with the increase in her Seroquel. She reports issues falling asleep and staying asleep. Discussed that since she spent most of yesterday in bed that would greatly affect her sleep. Encouraged her to get out of bed and be on the unit. Encouraged attending group therapy to develop and refine coping skills to better manage her depressive symptoms. She reported that she would get out of bed and attend groups today. She reports that her appetite is ok. She reports no SI, HI, or AVH. She has no other concerns at this time.   Principal Problem: Depression with suicidal ideation Diagnosis: Principal Problem:   Depression with suicidal ideation Active Problems:   Drug abuse (HCC)   Bipolar 1 disorder, depressed (HCC)   Borderline personality disorder in adult [facility](HCC)  Total Time spent with patient: 15 minutes  Past Psychiatric History: Bipolar 1 Disorder, Borderline Personality Disorder  Past Medical History:  Past Medical History:  Diagnosis Date   Abnormal Pap smear    Bipolar 1 disorder (HCC)    Bipolar disorder (HCC)    BV (bacterial vaginosis) 03/11/2013   Carpal tunnel syndrome on both sides    with preg   Depression    GERD (gastroesophageal reflux disease)    Headache(784.0)    Hypertension    Kidney stones    Nausea 07/14/2015   Obesity 06/17/2013   Ovarian cyst, right    Pregnancy induced hypertension    Pregnant 07/14/2015   Pregnant    RLQ abdominal pain 03/11/2013   Unspecified symptom associated with female genital organs 01/06/2014   Urinary tract infection    Vaginal discharge 03/11/2013    Past Surgical History:  Procedure Laterality Date    MYRINGOTOMY     TONSILLECTOMY     TONSILLECTOMY AND ADENOIDECTOMY Bilateral    TUBAL LIGATION N/A 02/10/2016   Procedure: POST PARTUM TUBAL LIGATION;  Surgeon: Willodean Rosenthalarolyn Harraway-Smith, MD;  Location: [facility];  Service: Gynecology;  Laterality: N/A;   Family History:  Family History  Problem Relation Age of Onset   Hypertension Mother    Heart attack Mother    Hypertension Father    Diabetes Father    Diabetes Maternal Grandfather    Diabetes Paternal Grandmother    Other Son        swallowing problems   Hypertension Maternal Aunt    Hypertension Maternal Uncle    Hypertension Paternal Aunt    Hypertension Paternal Uncle    Family Psychiatric  History: Bipolar Disorder in mother, grandfather, aunt, and sister Social History:  Social History   Substance and Sexual Activity  Alcohol Use No     Social History   Substance and Sexual Activity  Drug Use Yes   Frequency: 5.0 times per week   Types: Cocaine, "Crack" cocaine   Comment: pt states she last smoked crack 06/02/20    Social History   Socioeconomic History   Marital status: Single    Spouse name: Not on file   Number of children: Not on file   Years of education: Not on file   Highest education level: Not on file  Occupational History   Not on file  Tobacco Use  Smoking status: Current Every Day Smoker    Packs/day: 0.50    Years: 11.00    Pack years: 5.50    Types: Cigarettes   Smokeless tobacco: Never Used  Vaping Use   Vaping Use: Former  Substance and Sexual Activity   Alcohol use: No   Drug use: Yes    Frequency: 5.0 times per week    Types: Cocaine, "Crack" cocaine    Comment: pt states she last smoked crack 06/02/20   Sexual activity: Yes    Birth control/protection: Surgical    Comment: tubal  Other Topics Concern   Not on file  Social History Narrative   ** Merged History Encounter **       Social Determinants of Health   Financial Resource Strain: Not on file  Food Insecurity: Not on  file  Transportation Needs: Not on file  Physical Activity: Not on file  Stress: Not on file  Social Connections: Not on file   Additional Social History:                         Sleep: Poor  Appetite:  Fair  Current Medications: Current Facility-Administered Medications  Medication Dose Route Frequency Provider Last Rate Last Admin   acetaminophen (TYLENOL) tablet 650 mg  650 mg Oral Q6H PRN Jackelyn Poling, NP       alum & mag hydroxide-simeth (MAALOX/MYLANTA) 200-200-20 MG/5ML suspension 30 mL  30 mL Oral Q4H PRN Jackelyn Poling, NP       buPROPion (WELLBUTRIN XL) 24 hr tablet 300 mg  300 mg Oral Daily Havannah Streat, Worthy Rancher, MD       hydrOXYzine (ATARAX/VISTARIL) tablet 25 mg  25 mg Oral TID PRN Jackelyn Poling, NP   25 mg at 06/07/20 2135   influenza vac split quadrivalent PF (FLUARIX) injection 0.5 mL  0.5 mL Intramuscular Tomorrow-1000 Mande Auvil A, MD       magnesium hydroxide (MILK OF MAGNESIA) suspension 30 mL  30 mL Oral Daily PRN Nira Conn A, NP       pneumococcal 23 valent vaccine (PNEUMOVAX-23) injection 0.5 mL  0.5 mL Intramuscular Tomorrow-1000 Shan Valdes, Worthy Rancher, MD       QUEtiapine (SEROQUEL) tablet 200 mg  200 mg Oral QHS Ximenna Fonseca A, MD   200 mg at 06/07/20 2134   traZODone (DESYREL) tablet 50 mg  50 mg Oral QHS PRN Jackelyn Poling, NP   50 mg at 06/07/20 2135    Lab Results:  Results for orders placed or performed during the hospital encounter of 06/05/20 (from the past 48 hour(s))  Hemoglobin A1c     Status: None   Collection Time: 06/06/20  7:00 AM  Result Value Ref Range   Hgb A1c MFr Bld 5.2 4.8 - 5.6 %    Comment: (NOTE) Pre diabetes:          5.7%-6.4%  Diabetes:              >6.4%  Glycemic control for   <7.0% adults with diabetes    Mean Plasma Glucose 102.54 mg/dL    Comment: Performed at [facility], [address], [place], [place] [address]  Lipid panel     Status: Abnormal   Collection Time: 06/06/20  7:00 AM   Result Value Ref Range   Cholesterol 152 0 - 200 mg/dL   Triglycerides 341 <937 mg/dL   HDL 32 (L) >90 mg/dL   Total  CHOL/HDL Ratio 4.8 RATIO   VLDL 23 0 - 40 mg/dL   LDL Cholesterol 97 0 - 99 mg/dL    Comment:        Total Cholesterol/HDL:CHD Risk Coronary Heart Disease Risk Table                     Men   Women  1/2 Average Risk   3.4   3.3  Average Risk       5.0   4.4  2 X Average Risk   9.6   7.1  3 X Average Risk  23.4   11.0        Use the calculated Patient Ratio above and the CHD Risk Table to determine the patient's CHD Risk.        ATP III CLASSIFICATION (LDL):  <100     mg/dL   Optimal  701-779  mg/dL   Near or Above                    Optimal  130-159  mg/dL   Borderline  390-300  mg/dL   High  >923     mg/dL   Very High Performed at [facility], [address] [place], [place] [address]   TSH     Status: None   Collection Time: 06/06/20  7:00 AM  Result Value Ref Range   TSH 2.521 0.350 - 4.500 uIU/mL    Comment: Performed by a 3rd Generation assay with a functional sensitivity of <=0.01 uIU/mL. Performed at [facility], [address] [place], [place] [address]     Blood Alcohol level:  Lab Results  Component Value Date   [facility] <10 06/04/2020   ETH <11 07/25/2012    Metabolic Disorder Labs: Lab Results  Component Value Date   HGBA1C 5.2 06/06/2020   MPG 102.54 06/06/2020   No results found for: PROLACTIN Lab Results  Component Value Date   CHOL 152 06/06/2020   TRIG 115 06/06/2020   HDL 32 (L) 06/06/2020   CHOLHDL 4.8 06/06/2020   VLDL 23 06/06/2020   LDLCALC 97 06/06/2020    Physical Findings: AIMS: Facial and Oral Movements Muscles of Facial Expression: None, normal Lips and Perioral Area: None, normal Jaw: None, normal Tongue: None, normal,Extremity Movements Upper (arms, wrists, hands, fingers): None, normal Lower (legs, knees, ankles, toes): None, normal, Trunk Movements Neck,  shoulders, hips: None, normal, Overall Severity Severity of abnormal movements (highest score from questions above): None, normal Incapacitation due to abnormal movements: None, normal Patient's awareness of abnormal movements (rate only patient's report): No Awareness, Dental Status Current problems with teeth and/or dentures?: Yes Does patient usually wear dentures?: No  CIWA:    COWS:     Musculoskeletal: Strength & Muscle Tone: within normal limits Gait & Station: normal Patient leans: N/A  Psychiatric Specialty Exam: Physical Exam Vitals and nursing note reviewed.  Constitutional:      Appearance: Normal appearance. She is obese.  Neurological:     Mental Status: She is alert.     Review of Systems  Blood pressure 116/90, pulse 88, temperature 97.8 F (36.6 C), temperature source Oral, resp. rate 18, height 4\' 11"  (1.499 m), weight 70.3 kg, last menstrual period 06/01/2020, SpO2 99 %.Body mass index is 31.31 kg/m.  General Appearance: Casual and Disheveled  Eye Contact:  Fair  Speech:  Clear and Coherent and Normal Rate  Volume:  Decreased  Mood:  Depressed  Affect:  Depressed  Thought Process:  Coherent  Orientation:  Full (Time, Place, and Person)  Thought Content:  Logical  Suicidal Thoughts:  No  Homicidal Thoughts:  No  Memory:  Immediate;   Fair Recent;   Fair  Judgement:  Fair  Insight:  Fair  Psychomotor Activity:  Normal  Concentration:  Concentration: Fair and Attention Span: Fair  Recall:  Good  Fund of Knowledge:  Good  Language:  Good  Akathisia:  Negative  Handed:  Right  AIMS (if indicated):     Assets:  Communication Skills Resilience  ADL's:  Intact  Cognition:  WNL  Sleep:  Number of Hours: 6.25     Treatment Plan Summary: Daily contact with patient to assess and evaluate symptoms and progress in treatment   Wellbutrin was increased today, still little to no improvement of mood. Seroquel will be increased to 300mg  qhs, for mood  stabilization in th is patient with bipolar disorder. Encouraged attending group therapy.  -Wellbutrin XL 300 mg today -Continue Seroquel 300 mg QHS   -Continue: Norvasc 5 mg QD for HTN Prilosec 20 mg QD for GERD  -Continue PRN: Tylenol, Maalox, Atarax, Milk of Magnesia, and Trazodone   , MD 06/08/2020, 6:59 AM

## 2020-06-08 NOTE — Progress Notes (Signed)
Progress note    06/08/20 0754  Psych Admission Type (Psych Patients Only)  Admission Status Voluntary  Psychosocial Assessment  Patient Complaints Anxiety;Insomnia  Eye Contact Fair  Facial Expression Anxious;Sullen;Sad  Affect Anxious;Depressed;Sad;Sullen  Speech Logical/coherent  Interaction Assertive  Motor Activity Slow  Appearance/Hygiene In scrubs  Behavior Characteristics Cooperative;Appropriate to situation;Anxious  Mood Depressed;Anxious;Sad;Sullen;Pleasant  Thought Process  Coherency WDL  Content WDL  Delusions None reported or observed  Perception WDL  Hallucination None reported or observed  Judgment Poor  Confusion None  Danger to Self  Current suicidal ideation? Denies  Danger to Others  Danger to Others None reported or observed

## 2020-06-08 NOTE — Progress Notes (Signed)
[facility] Group Notes:  (Nursing/MHT/Case Management/Adjunct)  Date:  06/08/2020  Time:  2030  Type of Therapy:  wrap up group  Participation Level:  Active  Participation Quality:  Appropriate, Attentive, Sharing and Supportive  Affect:  Appropriate  Cognitive:  Appropriate  Insight:  Improving  Engagement in Group:  Engaged  Modes of Intervention:  Clarification, Education and Support  Summary of Progress/Problems: Positive thinking and positive change were discussed.   Johann Capers S 06/08/2020, 9:00 PM

## 2020-06-08 NOTE — Progress Notes (Addendum)
   06/08/20 2114  Psych Admission Type (Psych Patients Only)  Admission Status Voluntary  Psychosocial Assessment  Patient Complaints Insomnia  Eye Contact Fair  Facial Expression Anxious  Affect Appropriate to circumstance  Speech Logical/coherent  Interaction Assertive  Motor Activity Slow  Appearance/Hygiene In scrubs  Behavior Characteristics Cooperative;Appropriate to situation  Mood Pleasant  Thought Process  Coherency WDL  Content WDL  Delusions None reported or observed  Perception WDL  Hallucination None reported or observed  Judgment Limited  Confusion None  Danger to Self  Current suicidal ideation? Denies  Self-Injurious Behavior No self-injurious ideation or behavior indicators observed or expressed   Agreement Not to Harm Self Yes  Description of Agreement Verbal Contract  Danger to Others  Danger to Others None reported or observed  D: Patient presents with pleasant affect. Patient denies SI/HI at this time. Patient also denies AH/VH. Patient contracts for safety.  A: Provided positive reinforcement and encouragement.  R: Patient cooperative and receptive to efforts. Patient remains safe on the unit.

## 2020-06-08 NOTE — Progress Notes (Signed)
   06/07/20 2019  Psych Admission Type (Psych Patients Only)  Admission Status Voluntary  Psychosocial Assessment  Patient Complaints Anxiety;Depression  Eye Contact Fair  Facial Expression Flat;Sad  Affect Depressed;Sad;Flat  Speech Logical/coherent  Interaction Assertive  Motor Activity Other (Comment) (unremarkable)  Appearance/Hygiene In scrubs  Behavior Characteristics Appropriate to situation  Mood Depressed  Thought Process  Coherency WDL  Content WDL  Delusions None reported or observed  Perception WDL  Hallucination None reported or observed  Judgment Poor  Confusion None  Danger to Self  Current suicidal ideation? Denies  Self-Injurious Behavior No self-injurious ideation or behavior indicators observed or expressed   Agreement Not to Harm Self Yes  Description of Agreement verbal contract for safety not to harm self  Danger to Others  Danger to Others None reported or observed

## 2020-06-09 MED ORDER — QUETIAPINE FUMARATE 300 MG PO TABS
300.0000 mg | ORAL_TABLET | Freq: Every day | ORAL | Status: DC
  Administered 2020-06-09: 300 mg via ORAL
  Filled 2020-06-09 (×3): qty 1

## 2020-06-09 NOTE — [facility] Group Notes (Signed)
[facility] LCSW Group Therapy  06/09/2020 1:55 PM  Type of Therapy:  Group Therapy: Connecting With Your Peers  Participation Level:  Did Not Attend   Felizardo Hoffmann 06/09/2020, 1:55 PM

## 2020-06-09 NOTE — Progress Notes (Signed)
Recreation Therapy Notes  Date:  12.10.21 Time: 0930 Location: [address] Dayroom  Group Topic: Stress Management  Goal Area(s) Addresses:  Patient will identify positive stress management techniques. Patient will identify benefits of using stress management post d/c.  Intervention: Stress Management  Activity : Guided Imagery.  LRT read a script that took patients on a journey to the beach to listen to the peaceful waves.  Patients were to listen and follow along as LRT read script to engage in activity.  Education:  Stress Management, Discharge Planning.   Education Outcome: Acknowledges Education  Clinical Observations/Feedback: Pt did not attend group activity.     Caroll Rancher, LRT/CTRS         Caroll Rancher A 06/09/2020 11:50 AM

## 2020-06-09 NOTE — Progress Notes (Addendum)
   06/09/20 1200  Psych Admission Type (Psych Patients Only)  Admission Status Voluntary  Psychosocial Assessment  Patient Complaints None  Eye Contact Fair  Facial Expression Anxious  Affect Appropriate to circumstance  Speech Logical/coherent  Interaction Assertive  Motor Activity Slow  Appearance/Hygiene In scrubs  Behavior Characteristics Cooperative;Appropriate to situation  Mood Pleasant  Thought Process  Coherency WDL  Content WDL  Delusions None reported or observed  Perception WDL  Hallucination None reported or observed  Judgment Limited  Confusion None  Danger to Self  Current suicidal ideation? Denies  Self-Injurious Behavior No self-injurious ideation or behavior indicators observed or expressed   Agreement Not to Harm Self Yes  Description of Agreement Verbal Contract  Danger to Others  Danger to Others None reported or observed     D. Pt was somewhat isolative today, but was observed in the dayroom with peers in the afternoon. Pt has not attended any groups this shift despite encouragement from staff. Pt was unable to answer why she did not attend groups.Pt's goal today per self inventory was to  "attend groups". Pt did endorse having some 'stomach pain', 5/10, earlier today, stating that she had some indigestion, but declined prn med when offered. Per pt's self inventory, pt rated her depression, hopelessness and anxiety a 2/0/0, respectively.  Pt currently denies SI/HI and AVH  A. Labs and vitals monitored. Pt given and educated on medications. Pt supported emotionally and encouraged to express concerns and ask questions.   R. Pt remains safe with 15 minute checks. Will continue POC.

## 2020-06-09 NOTE — Progress Notes (Signed)
[facility] MD Progress Note  06/09/2020 9:12 AM Connie [name]  MRN:  [id number] Subjective:  Connie [name] is a 29 yr old female who presented voluntarily to [facility] for SI. PPHx is significant for Bipolar 1 Disorder, Borderline Personality Disorder, and Cocaine Use Disorder.  Pt reports sleeping better last night, 6-7 hours uninterrupted. She states that this is the best she has had since admission. Reports strong appetite. She believes her medications are starting to work for her. Additionally she notes that her energy and mood are improved this morning and she feels that her racing thoughts have improvedl. Last evening she socialized with other people on the unit and states that she is looking forward to talking to them again today. She states that she is also looking forward to going to group sessions today. She states that she feels she is nearing ready to be discharged. Asking questions about receiving help with transportation at d/c.  Pt denies any SI/HI, AVH, thoughts of self-harm or harming others.   Principal Problem: Depression with suicidal ideation Diagnosis: Principal Problem:   Depression with suicidal ideation Active Problems:   Drug abuse (HCC)   Bipolar 1 disorder, depressed (HCC)   Borderline personality disorder in adult [facility])  Total Time spent with patient: 15 minutes  Past Psychiatric History: Bipolar 1 Disorder, Borderline Personality Disorder  Past Medical History:  Past Medical History:  Diagnosis Date  . Abnormal Pap smear   . Bipolar 1 disorder (HCC)   . Bipolar disorder (HCC)   . BV (bacterial vaginosis) 03/11/2013  . Carpal tunnel syndrome on both sides    with preg  . Depression   . GERD (gastroesophageal reflux disease)   . Headache(784.0)   . Hypertension   . Kidney stones   . Nausea 07/14/2015  . Obesity 06/17/2013  . Ovarian cyst, right   . Pregnancy induced hypertension   . Pregnant 07/14/2015  . Pregnant   . RLQ abdominal pain 03/11/2013   . Unspecified symptom associated with female genital organs 01/06/2014  . Urinary tract infection   . Vaginal discharge 03/11/2013    Past Surgical History:  Procedure Laterality Date  . MYRINGOTOMY    . TONSILLECTOMY    . TONSILLECTOMY AND ADENOIDECTOMY Bilateral   . TUBAL LIGATION N/A 02/10/2016   Procedure: POST PARTUM TUBAL LIGATION;  Surgeon: Willodean Rosenthal, MD;  Location: [facility];  Service: Gynecology;  Laterality: N/A;   Family History:  Family History  Problem Relation Age of Onset  . Hypertension Mother   . Heart attack Mother   . Hypertension Father   . Diabetes Father   . Diabetes Maternal Grandfather   . Diabetes Paternal Grandmother   . Other Son        swallowing problems  . Hypertension Maternal Aunt   . Hypertension Maternal Uncle   . Hypertension Paternal Aunt   . Hypertension Paternal Uncle    Family Psychiatric  History: Bipolar Disorder in mother, grandfather, aunt, and sister Social History:  Social History   Substance and Sexual Activity  Alcohol Use No     Social History   Substance and Sexual Activity  Drug Use Yes  . Frequency: 5.0 times per week  . Types: Cocaine, "Crack" cocaine   Comment: pt states she last smoked crack 06/02/20    Social History   Socioeconomic History  . Marital status: Single    Spouse name: Not on file  . Number of children: Not on file  . Years of education:  Not on file  . Highest education level: Not on file  Occupational History  . Not on file  Tobacco Use  . Smoking status: Current Every Day Smoker    Packs/day: 0.50    Years: 11.00    Pack years: 5.50    Types: Cigarettes  . Smokeless tobacco: Never Used  Vaping Use  . Vaping Use: Former  Substance and Sexual Activity  . Alcohol use: No  . Drug use: Yes    Frequency: 5.0 times per week    Types: Cocaine, "Crack" cocaine    Comment: pt states she last smoked crack 06/02/20  . Sexual activity: Yes    Birth control/protection: Surgical     Comment: tubal  Other Topics Concern  . Not on file  Social History Narrative   ** Merged History Encounter **       Social Determinants of Health   Financial Resource Strain: Not on file  Food Insecurity: Not on file  Transportation Needs: Not on file  Physical Activity: Not on file  Stress: Not on file  Social Connections: Not on file   Additional Social History:                         Sleep: Fair, improved  Appetite:  Good  Current Medications: Current Facility-Administered Medications  Medication Dose Route Frequency Provider Last Rate Last Admin  . acetaminophen (TYLENOL) tablet 650 mg  650 mg Oral Q6H PRN Nira Conn A, NP      . alum & mag hydroxide-simeth (MAALOX/MYLANTA) 200-200-20 MG/5ML suspension 30 mL  30 mL Oral Q4H PRN Nira Conn A, NP   30 mL at 06/09/20 0639  . buPROPion (WELLBUTRIN XL) 24 hr tablet 300 mg  300 mg Oral Daily Khloie Hamada, Worthy Rancher, MD   300 mg at 06/08/20 0754  . hydrOXYzine (ATARAX/VISTARIL) tablet 25 mg  25 mg Oral TID PRN Jackelyn Poling, NP   25 mg at 06/08/20 2114  . influenza vac split quadrivalent PF (FLUARIX) injection 0.5 mL  0.5 mL Intramuscular Tomorrow-1000 Charlcie Prisco A, MD      . magnesium hydroxide (MILK OF MAGNESIA) suspension 30 mL  30 mL Oral Daily PRN Nira Conn A, NP      . pneumococcal 23 valent vaccine (PNEUMOVAX-23) injection 0.5 mL  0.5 mL Intramuscular Tomorrow-1000 Dalon Reichart A, MD      . QUEtiapine (SEROQUEL) tablet 200 mg  200 mg Oral QHS Elene Downum, Worthy Rancher, MD   200 mg at 06/08/20 2114  . traZODone (DESYREL) tablet 50 mg  50 mg Oral QHS PRN Jackelyn Poling, NP   50 mg at 06/08/20 2114    Lab Results: No results found for this or any previous visit (from the past 48 hour(s)).  Blood Alcohol level:  Lab Results  Component Value Date   [facility] <10 06/04/2020   ETH <11 07/25/2012    Metabolic Disorder Labs: Lab Results  Component Value Date   HGBA1C 5.2 06/06/2020   MPG 102.54 06/06/2020    No results found for: PROLACTIN Lab Results  Component Value Date   CHOL 152 06/06/2020   TRIG 115 06/06/2020   HDL 32 (L) 06/06/2020   CHOLHDL 4.8 06/06/2020   VLDL 23 06/06/2020   LDLCALC 97 06/06/2020    Physical Findings: AIMS: Facial and Oral Movements Muscles of Facial Expression: None, normal Lips and Perioral Area: None, normal Jaw: None, normal Tongue: None, normal,Extremity Movements Upper (arms, wrists, hands,  fingers): None, normal Lower (legs, knees, ankles, toes): None, normal, Trunk Movements Neck, shoulders, hips: None, normal, Overall Severity Severity of abnormal movements (highest score from questions above): None, normal Incapacitation due to abnormal movements: None, normal Patient's awareness of abnormal movements (rate only patient's report): No Awareness, Dental Status Current problems with teeth and/or dentures?: Yes Does patient usually wear dentures?: No  CIWA:    COWS:     Musculoskeletal: Strength & Muscle Tone: within normal limits Gait & Station: Pt lying in bed. Patient leans: N/A  Psychiatric Specialty Exam: Physical Exam Constitutional:      Appearance: Normal appearance.  HENT:     Head: Normocephalic and atraumatic.  Pulmonary:     Effort: Pulmonary effort is normal.  Musculoskeletal:        General: Normal range of motion.  Skin:    General: Skin is dry.  Neurological:     Mental Status: She is alert.     Review of Systems  Blood pressure 123/86, pulse 96, temperature 97.6 F (36.4 C), temperature source Oral, resp. rate 18, height 4\' 11"  (1.499 m), weight 70.3 kg, last menstrual period 06/01/2020, SpO2 97 %.Body mass index is 31.31 kg/m.  General Appearance: Casual and Fairly Groomed  Eye Contact:  Good  Speech:  Clear and Coherent and Normal Rate  Volume:  Normal  Mood:  Euthymic  Affect:  Appropriate and Congruent  Thought Process:  Coherent and Linear  Orientation:  Full (Time, Place, and Person)  Thought  Content:  Logical  Suicidal Thoughts:  No  Homicidal Thoughts:  No  Memory:  Recent;   Good  Judgement:  Fair  Insight:  Fair  Psychomotor Activity:  Psychomotor Retardation  Concentration:  Concentration: Fair  Recall:  Fair  Fund of Knowledge:  Good  Language:  Good  Akathisia:  No  Handed:  Right  AIMS (if indicated):     Assets:  Communication Skills Desire for Improvement Resilience  ADL's:  Intact  Cognition:  WNL  Sleep:  Number of Hours: 5     Treatment Plan Summary: Daily contact with patient to assess and evaluate symptoms and progress in treatment and medication management.  Pt reports improved sleep, energy, and mood today after increasing Wellbutrin and Seroquel. Increased interaction on unit with other patients yesterday. She plans to attend group therapy today. Will continue current dose of Wellbutrin and Seroquel as seeing improvements. Plan to increase tomorrow if no further engagement in groups today.  -WellbutrinXL 300mg  today -Seroquel300mg  QHS  -Continue: Norvasc 5 mg QD for HTN Prilosec 20 mg QD for GERD  14/08/2019, Medical Student 06/09/2020, 9:12 AM

## 2020-06-09 NOTE — Progress Notes (Signed)
[place] NOVEL CORONAVIRUS (COVID-19) DAILY CHECK-OFF SYMPTOMS - answer yes or no to each - every day NO YES  Have you had a fever in the past 24 hours?  . Fever (Temp > 37.80C / 100F) X   Have you had any of these symptoms in the past 24 hours? . New Cough .  Sore Throat  .  Shortness of Breath .  Difficulty Breathing .  Unexplained Body Aches   X   Have you had any one of these symptoms in the past 24 hours not related to allergies?   . Runny Nose .  Nasal Congestion .  Sneezing   X   If you have had runny nose, nasal congestion, sneezing in the past 24 hours, has it worsened?  X   EXPOSURES - check yes or no X   Have you traveled outside the state in the past 14 days?  X   Have you been in contact with someone with a confirmed diagnosis of COVID-19 or PUI in the past 14 days without wearing appropriate PPE?  X   Have you been living in the same home as a person with confirmed diagnosis of COVID-19 or a PUI (household contact)?    X   Have you been diagnosed with COVID-19?    X              What to do next: Answered NO to all: Answered YES to anything:   Proceed with unit schedule Follow the [facility] Inpatient Flowsheet.   

## 2020-06-10 MED ORDER — QUETIAPINE FUMARATE 300 MG PO TABS: 300.0000 mg | ORAL_TABLET | Freq: Every day | ORAL | 0 refills | Status: AC

## 2020-06-10 MED ORDER — BUPROPION HCL ER (XL) 300 MG PO TB24: 300.0000 mg | ORAL_TABLET | Freq: Every day | ORAL | 0 refills | Status: AC

## 2020-06-10 NOTE — [facility] Suicide Risk Assessment (Signed)
[facility] INPATIENT:  Family/Significant Other Suicide Prevention Education  Suicide Prevention Education:  Education Completed; friend Clovia Cuff [phone],  (name of family member/significant other) has been identified by the patient as the family member/significant other with whom the patient will be residing, and identified as the person(s) who will aid the patient in the event of a mental health crisis (suicidal ideations/suicide attempt).  With written consent from the patient, the family member/significant other has been provided the following suicide prevention education, prior to the and/or following the discharge of the patient.  The suicide prevention education provided includes the following:  Suicide risk factors  Suicide prevention and interventions  National Suicide Hotline telephone number  [facility] assessment telephone number  [facility] Emergency Assistance 911  [facility] and/or Residential Mobile Crisis Unit telephone number  Request made of family/significant other to:  Remove weapons (e.g., guns, rifles, knives), all items previously/currently identified as safety concern.    Remove drugs/medications (over-the-counter, prescriptions, illicit drugs), all items previously/currently identified as a safety concern.  The family member/significant other verbalizes understanding of the suicide prevention education information provided.  The family member/significant other agrees to remove the items of safety concern listed above.  Friend stated she has her own mental health concerns and has been hospitalized psychiatrically before, so she understands being the recipient of services.  CSW answered her questions about how to provide support if she thinks that patient is becoming depression or suicidal.  Lynnell Chad 06/10/2020, 1:18 PM

## 2020-06-10 NOTE — [facility] Group Notes (Signed)
[facility] Group Notes: (Clinical Social Work)   06/10/2020      Type of Therapy:  Group Therapy   Participation Level:  Did Not Attend - was invited both individually by [facility] and by overhead announcement, chose not to attend.   Ambrose Mantle, LCSW 06/10/2020, 11:02 AM

## 2020-06-10 NOTE — [facility] Suicide Risk Assessment (Signed)
[facility] Discharge Suicide Risk Assessment   Principal Problem: Bipolar 1 disorder, depressed (HCC) Discharge Diagnoses: Principal Problem:   Bipolar 1 disorder, depressed (HCC) Active Problems:   Drug abuse (HCC)   History of cocaine use   Borderline personality disorder in adult [facility])   Total Time spent with patient: 20 minutes  Patient is seen and evaluated prior to discharge.  Patient states that she came to the hospital due to her depression and having worsening suicidal thoughts.  She states, "I needed to get my mind straight, and start taking the right medicines."  Patient reports that she is no longer having suicidal thoughts.  She relates how her cocaine use has contributed to her problems, and states she wants to stay away from drugs.  She reports that she will be able to stay away from the people who were using drugs and that will keep her from using.  She is optimistic that she can be successful.  Patient is denying any suicidal ideation.  She is denying any homicidal ideation.  She is not having any auditory or visual hallucinations.  Patient is able to contract for safety at discharge.  She intends to live with her friend, Connie [name] who does not use/abuse drugs.  Musculoskeletal: Strength & Muscle Tone: within normal limits Gait & Station: normal Patient leans: N/A  Psychiatric Specialty Exam: Physical Exam Vitals and nursing note reviewed.  Constitutional:      Appearance: Normal appearance. She is normal weight.  HENT:     Head: Normocephalic and atraumatic.  Cardiovascular:     Rate and Rhythm: Tachycardia present.  Pulmonary:     Effort: Pulmonary effort is normal.  Musculoskeletal:        General: Normal range of motion.  Neurological:     General: No focal deficit present.     Mental Status: She is alert.     Review of Systems  Constitutional: Negative for fatigue and fever.  Respiratory: Negative for chest tightness and shortness of breath.   Cardiovascular:  Negative for chest pain and palpitations.  Gastrointestinal: Negative for abdominal pain, constipation, diarrhea, nausea and vomiting.  Neurological: Negative for dizziness, weakness, light-headedness and headaches.  Psychiatric/Behavioral: Negative for agitation, self-injury, sleep disturbance and suicidal ideas. The patient is not nervous/anxious.     Blood pressure (!) 129/91, pulse (!) 108, temperature 98.1 F (36.7 C), temperature source Oral, resp. rate 18, height 4\' 11"  (1.499 m), weight 70.3 kg, last menstrual period 06/01/2020, SpO2 97 %.Body mass index is 31.31 kg/m.  General Appearance: Fairly Groomed  Eye Contact:  Good  Speech:  Clear and Coherent and Normal Rate  Volume:  Normal  Mood:  Appropriate  Affect:  Appropriate  Thought Process:  Coherent and Goal Directed  Orientation:  Full (Time, Place, and Person)  Thought Content:  Logical  Suicidal Thoughts:  No  Homicidal Thoughts:  No  Memory:  Immediate;   Good Recent;   Good  Judgement:  Good  Insight:  Good  Psychomotor Activity:  Normal  Concentration:  Concentration: Good and Attention Span: Good  Recall:  Good  Fund of Knowledge:  Good  Language:  Good  Akathisia:  Negative  Handed:  Right  AIMS (if indicated):     Assets:  Communication Skills Desire for Improvement Resilience  ADL's:  Intact  Cognition:  WNL   Mental Status Per Nursing Assessment::   On Admission:  Suicidal ideation indicated by patient  Demographic Factors:  Caucasian and Unemployed  Loss Factors: Children  and DSS custody for several years  Historical Factors: Prior suicide attempts, Family history of mental illness or substance abuse, Impulsivity and PTSD after witnessing mother and boyfriend's deaths  Risk Reduction Factors:   Living with another person, especially a relative, Positive social support, Positive coping skills or problem solving skills and No access to weapons  Continued Clinical Symptoms:   Anxiety Depression History of Substance Abuse/Dependencies  Cognitive Features That Contribute To Risk:  None    Suicide Risk:  Minimal: No identifiable suicidal ideation.  Patients presenting with no risk factors but with morbid ruminations; may be classified as minimal risk based on the severity of the depressive symptoms   Follow-up Information    [facility]. Go on 06/12/2020.   Why: You have a hospital follow up appointment on 06/12/20 at 8:30 am for therapy and medication management services.  This appointment will be held in person. (You may go at any time on this day for services). Contact information: [address] [place] [address] [phone]               Plan Of Care/Follow-up recommendations:  Activity:  Ad lib. Diet:  As tolerated   On day of discharge following sustained improvement in the affect of this patient, continued report of euthymic mood, repeated denial of suicidal, homicidal, and other violent ideation, adequate interaction with peers, active participation in groups while on the unit, and denial of adverse reactions from medications, the treatment team decided Connie [name] was stable for discharge home with scheduled mental health treatment as noted above.  She was able to engage in safety planning including plan to return to nearest emergency room or contact emergency services if she feels unable to maintain her own safety or the safety of others. Patient had no further questions, comments, or concerns.  Discharge into care of friend, Connie [name], who agrees to maintain patient safety.  Patient aware to return to nearest crisis center, ED or to call 911 for worsening symptoms of depression, suicidal or homicidal thoughts or AVH.    Mariel Craft, MD 06/10/2020, 1:31 PM

## 2020-06-10 NOTE — Progress Notes (Signed)
   06/10/20 0900  Psych Admission Type (Psych Patients Only)  Admission Status Voluntary  Psychosocial Assessment  Patient Complaints None  Eye Contact Fair  Facial Expression Anxious  Affect Appropriate to circumstance  Speech Logical/coherent  Interaction Assertive  Motor Activity Slow  Appearance/Hygiene In scrubs  Behavior Characteristics Cooperative;Appropriate to situation  Mood Anxious;Pleasant  Thought Process  Coherency WDL  Content WDL  Delusions None reported or observed  Perception WDL  Hallucination None reported or observed  Judgment Limited  Confusion None  Danger to Self  Current suicidal ideation? Denies  Self-Injurious Behavior No self-injurious ideation or behavior indicators observed or expressed   Agreement Not to Harm Self Yes  Description of Agreement Verbal Contract  Danger to Others  Danger to Others None reported or observed

## 2020-06-10 NOTE — Progress Notes (Signed)
[place] NOVEL CORONAVIRUS (COVID-19) DAILY CHECK-OFF SYMPTOMS - answer yes or no to each - every day NO YES  Have you had a fever in the past 24 hours?  . Fever (Temp > 37.80C / 100F) X   Have you had any of these symptoms in the past 24 hours? . New Cough .  Sore Throat  .  Shortness of Breath .  Difficulty Breathing .  Unexplained Body Aches   X   Have you had any one of these symptoms in the past 24 hours not related to allergies?   . Runny Nose .  Nasal Congestion .  Sneezing   X   If you have had runny nose, nasal congestion, sneezing in the past 24 hours, has it worsened?  X   EXPOSURES - check yes or no X   Have you traveled outside the state in the past 14 days?  X   Have you been in contact with someone with a confirmed diagnosis of COVID-19 or PUI in the past 14 days without wearing appropriate PPE?  X   Have you been living in the same home as a person with confirmed diagnosis of COVID-19 or a PUI (household contact)?    X   Have you been diagnosed with COVID-19?    X              What to do next: Answered NO to all: Answered YES to anything:   Proceed with unit schedule Follow the [facility] Inpatient Flowsheet.   
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
## 2020-06-10 NOTE — [facility] Group Notes (Signed)
.  Psychoeducational Group Note  Date: 06-10-20 Time: 0900-1000    Goal Setting   Purpose of Group: This group helps to provide patients with the steps of setting a goal that is specific, measurable, attainable, realistic and time specific. A discussion on how we keep ourselves stuck with negative self talk.    Participation Level:  Did not attend   Chima Astorino A  

## 2020-06-10 NOTE — Progress Notes (Signed)
  [facility] Adult Case Management Discharge Plan :  Will you be returning to the same living situation after discharge:  No.  Is going to stay with a friend in [place]  At discharge, do you have transportation home?: No.  Needs assistance from CSW Do you have the ability to pay for your medications: Yes,  insurance and disability income  Release of information consent forms completed and emailed to Medical Records, then turned in to Medical Records by CSW.   Patient to Follow up at:  Follow-up Information    [facility] Go on 06/12/2020.   Why: You have a hospital follow up appointment on 06/12/20 at 8:30 am for therapy and medication management services.  This appointment will be held in person. (You may go at any time on this day for services). Contact information: [address] [place] [address] [phone]               Next level of care provider has access to [facility] Link:no  Safety Planning and Suicide Prevention discussed: Yes,  with friend she will stay with  Have you used any form of tobacco in the last 30 days? (Cigarettes, Smokeless Tobacco, Cigars, and/or Pipes): Yes  Has patient been referred to the Quitline?: Patient refused referral  Patient has been referred for addiction treatment: Yes  Lynnell Chad, LCSW 06/10/2020, 9:49 AM

## 2020-06-10 NOTE — Progress Notes (Signed)
   06/09/20 2210  COVID-19 Daily Checkoff  Have you had a fever (temp > 37.80C/100F)  in the past 24 hours?  No  If you have had runny nose, nasal congestion, sneezing in the past 24 hours, has it worsened? No  COVID-19 EXPOSURE  Have you traveled outside the state in the past 14 days? No  Have you been in contact with someone with a confirmed diagnosis of COVID-19 or PUI in the past 14 days without wearing appropriate PPE? No  Have you been living in the same home as a person with confirmed diagnosis of COVID-19 or a PUI (household contact)? No  Have you been diagnosed with COVID-19? No

## 2020-06-10 NOTE — Progress Notes (Signed)
Pt discharged to lobby with [facility] outside to transport pt.. Pt was stable and appreciative at that time. All papers were given and valuables returned. Verbal understanding expressed. Denies SI/HI and A/VH. Pt given opportunity to express concerns and ask questions.

## 2020-11-01 ENCOUNTER — Other Ambulatory Visit: Payer: Self-pay

## 2020-11-01 ENCOUNTER — Encounter (HOSPITAL_COMMUNITY): Payer: Self-pay

## 2020-11-01 DIAGNOSIS — F1410 Cocaine abuse, uncomplicated: Secondary | ICD-10-CM | POA: Insufficient documentation

## 2020-11-01 DIAGNOSIS — F32A Depression, unspecified: Secondary | ICD-10-CM | POA: Insufficient documentation

## 2020-11-01 DIAGNOSIS — R112 Nausea with vomiting, unspecified: Secondary | ICD-10-CM | POA: Diagnosis present

## 2020-11-01 DIAGNOSIS — Z79899 Other long term (current) drug therapy: Secondary | ICD-10-CM | POA: Insufficient documentation

## 2020-11-01 DIAGNOSIS — R197 Diarrhea, unspecified: Secondary | ICD-10-CM | POA: Insufficient documentation

## 2020-11-01 DIAGNOSIS — K219 Gastro-esophageal reflux disease without esophagitis: Secondary | ICD-10-CM | POA: Diagnosis not present

## 2020-11-01 DIAGNOSIS — I10 Essential (primary) hypertension: Secondary | ICD-10-CM | POA: Insufficient documentation

## 2020-11-01 DIAGNOSIS — F17210 Nicotine dependence, cigarettes, uncomplicated: Secondary | ICD-10-CM | POA: Diagnosis not present

## 2020-11-01 DIAGNOSIS — R109 Unspecified abdominal pain: Secondary | ICD-10-CM | POA: Diagnosis not present

## 2020-11-01 NOTE — ED Triage Notes (Signed)
Pt to er, pt states that she has been sick for the past week, states that she has had about 20 kidney stones in the past year. Pt states that she has had one before and this feels the same, states that she has some vomiting, but the pain is worse, states that she has been given medication, but it isn't helping.

## 2020-11-02 ENCOUNTER — Emergency Department (HOSPITAL_COMMUNITY)
Admission: EM | Admit: 2020-11-02 | Discharge: 2020-11-02 | Disposition: A | Discharge status: Home / Self Care | Payer: Medicare Other | Attending: Emergency Medicine | Admitting: Emergency Medicine

## 2020-11-02 ENCOUNTER — Other Ambulatory Visit: Payer: Self-pay

## 2020-11-02 ENCOUNTER — Encounter (HOSPITAL_COMMUNITY): Payer: Self-pay | Admitting: Emergency Medicine

## 2020-11-02 DIAGNOSIS — K219 Gastro-esophageal reflux disease without esophagitis: Secondary | ICD-10-CM | POA: Insufficient documentation

## 2020-11-02 DIAGNOSIS — F32A Depression, unspecified: Secondary | ICD-10-CM | POA: Insufficient documentation

## 2020-11-02 DIAGNOSIS — R197 Diarrhea, unspecified: Secondary | ICD-10-CM

## 2020-11-02 DIAGNOSIS — R109 Unspecified abdominal pain: Secondary | ICD-10-CM | POA: Insufficient documentation

## 2020-11-02 DIAGNOSIS — F1410 Cocaine abuse, uncomplicated: Secondary | ICD-10-CM | POA: Insufficient documentation

## 2020-11-02 DIAGNOSIS — F17210 Nicotine dependence, cigarettes, uncomplicated: Secondary | ICD-10-CM | POA: Insufficient documentation

## 2020-11-02 DIAGNOSIS — Z79899 Other long term (current) drug therapy: Secondary | ICD-10-CM | POA: Insufficient documentation

## 2020-11-02 DIAGNOSIS — R112 Nausea with vomiting, unspecified: Secondary | ICD-10-CM

## 2020-11-02 DIAGNOSIS — I10 Essential (primary) hypertension: Secondary | ICD-10-CM | POA: Insufficient documentation

## 2020-11-02 LAB — COMPREHENSIVE METABOLIC PANEL
ALT: 13 U/L (ref 0–44)
AST: 15 U/L (ref 15–41)
Albumin: 4.1 g/dL (ref 3.5–5.0)
Alkaline Phosphatase: 45 U/L (ref 38–126)
Anion gap: 6 (ref 5–15)
BUN: 15 mg/dL (ref 6–20)
CO2: 26 mmol/L (ref 22–32)
Calcium: 9.1 mg/dL (ref 8.9–10.3)
Chloride: 106 mmol/L (ref 98–111)
Creatinine, Ser: 0.87 mg/dL (ref 0.44–1.00)
GFR, Estimated: >60 mL/min (ref >60)
Glucose, Bld: 127 mg/dL — ABNORMAL HIGH (ref 70–99)
Potassium: 3.3 mmol/L — ABNORMAL LOW (ref 3.5–5.1)
Sodium: 138 mmol/L (ref 135–145)
Total Bilirubin: 0.6 mg/dL (ref 0.3–1.2)
Total Protein: 6.7 g/dL (ref 6.5–8.1)

## 2020-11-02 LAB — CBC WITH DIFFERENTIAL/PLATELET
Abs Immature Granulocytes: 0.02 10*3/uL (ref 0.00–0.07)
Basophils Absolute: 0 10*3/uL (ref 0.0–0.1)
Basophils Relative: 1 %
Eosinophils Absolute: 0.2 10*3/uL (ref 0.0–0.5)
Eosinophils Relative: 3 %
HCT: 41.5 % (ref 36.0–46.0)
Hemoglobin: 13.6 g/dL (ref 12.0–15.0)
Immature Granulocytes: 0 %
Lymphocytes Relative: 36 %
Lymphs Abs: 2.5 10*3/uL (ref 0.7–4.0)
MCH: 32.1 pg (ref 26.0–34.0)
MCHC: 32.8 g/dL (ref 30.0–36.0)
MCV: 97.9 fL (ref 80.0–100.0)
Monocytes Absolute: 0.4 10*3/uL (ref 0.1–1.0)
Monocytes Relative: 6 %
Neutro Abs: 3.7 10*3/uL (ref 1.7–7.7)
Neutrophils Relative %: 54 %
Platelets: 240 10*3/uL (ref 150–400)
RBC: 4.24 MIL/uL (ref 3.87–5.11)
RDW: 14.1 % (ref 11.5–15.5)
WBC: 6.8 10*3/uL (ref 4.0–10.5)
nRBC: 0 % (ref 0.0–0.2)

## 2020-11-02 LAB — URINALYSIS, ROUTINE W REFLEX MICROSCOPIC
Bilirubin Urine: NEGATIVE
Glucose, UA: NEGATIVE mg/dL
Ketones, ur: 5 mg/dL — AB
Nitrite: NEGATIVE
Protein, ur: NEGATIVE mg/dL
Specific Gravity, Urine: 1.028 (ref 1.005–1.030)
pH: 6 (ref 5.0–8.0)

## 2020-11-02 MED ORDER — ONDANSETRON HCL 8 MG PO TABS: 8.0000 mg | ORAL_TABLET | Freq: Three times a day (TID) | ORAL | 0 refills | Status: DC | PRN

## 2020-11-02 MED ORDER — LACTATED RINGERS IV BOLUS
1000.0000 mL | Freq: Once | INTRAVENOUS | Status: AC
  Administered 2020-11-02: 1000 mL via INTRAVENOUS

## 2020-11-02 MED ORDER — ONDANSETRON HCL 4 MG/2ML IJ SOLN
4.0000 mg | Freq: Once | INTRAMUSCULAR | Status: AC
  Administered 2020-11-02: 4 mg via INTRAVENOUS
  Filled 2020-11-02: qty 2

## 2020-11-02 MED ORDER — DIPHENHYDRAMINE HCL 25 MG PO CAPS
25.0000 mg | ORAL_CAPSULE | Freq: Once | ORAL | Status: AC
  Administered 2020-11-02: 25 mg via ORAL
  Filled 2020-11-02: qty 1

## 2020-11-02 MED ORDER — FENTANYL CITRATE (PF) 100 MCG/2ML IJ SOLN
50.0000 ug | Freq: Once | INTRAMUSCULAR | Status: AC
  Administered 2020-11-02: 50 ug via INTRAVENOUS
  Filled 2020-11-02: qty 2

## 2020-11-02 MED ORDER — ACETAMINOPHEN 500 MG PO TABS
1000.0000 mg | ORAL_TABLET | Freq: Once | ORAL | Status: AC
  Administered 2020-11-02: 1000 mg via ORAL
  Filled 2020-11-02: qty 2

## 2020-11-02 NOTE — ED Provider Notes (Signed)
[facility] EMERGENCY DEPARTMENT Provider Note   CSN: [id number] Arrival date & time: 11/01/20  1933     History Chief Complaint  Patient presents with  . Abdominal Pain    Connie [name] is a 30 y.o. female.  Was diagnosed with a kidney stone and is being treated for the same.  Over the last couple days she has had diarrhea and vomiting.  No fevers.  She states that this is similar to a GI bug she had in the past.  Still having some abdominal discomfort but seems to be related to the vomiting.  No known sick contacts.  No other associated symptoms.        Past Medical History:  Diagnosis Date  . Abnormal Pap smear   . Bipolar 1 disorder (HCC)   . Bipolar disorder (HCC)   . BV (bacterial vaginosis) 03/11/2013  . Carpal tunnel syndrome on both sides    with preg  . Depression   . GERD (gastroesophageal reflux disease)   . Headache(784.0)   . Hypertension   . Kidney stones   . Nausea 07/14/2015  . Obesity 06/17/2013  . Ovarian cyst, right   . Pregnancy induced hypertension   . Pregnant 07/14/2015  . Pregnant   . RLQ abdominal pain 03/11/2013  . Unspecified symptom associated with female genital organs 01/06/2014  . Urinary tract infection   . Vaginal discharge 03/11/2013    Patient Active Problem List   Diagnosis Date Noted  . Borderline personality disorder in adult [facility]) 06/06/2020  . Depression with suicidal ideation 06/06/2020  . Bipolar 1 disorder, depressed (HCC)   . 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 H/O pre-eclampsia 03/11/2016  . Encounter for sterilization 02/10/2016  . Marijuana use 09/13/2015  . Smoker 09/13/2015  . Nausea 07/14/2015  . History of cocaine use 06/30/2014  . Severe episode of recurrent major depressive disorder, without psychotic features (HCC) 06/26/2014  . Obesity 06/17/2013  . 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 Bipolar affective disorder (HCC) 07/02/2012  . Drug abuse (HCC) 07/02/2012    Past Surgical History:  Procedure  Laterality Date  . MYRINGOTOMY    . TONSILLECTOMY    . TONSILLECTOMY AND ADENOIDECTOMY Bilateral   . TUBAL LIGATION N/A 02/10/2016   Procedure: POST PARTUM TUBAL LIGATION;  Surgeon: Willodean Rosenthal, MD;  Location: [facility];  Service: Gynecology;  Laterality: N/A;     OB History    Gravida  2   Para  2   Term  2   Preterm  0   AB  0   Living  2     SAB  0   IAB  0   Ectopic  0   Multiple  0   Live Births  2           Family History  Problem Relation Age of Onset  . Hypertension Mother   . Heart attack Mother   . Hypertension Father   . Diabetes Father   . Diabetes Maternal Grandfather   . Diabetes Paternal Grandmother   . Other Son        swallowing problems  . Hypertension Maternal Aunt   . Hypertension Maternal Uncle   . Hypertension Paternal Aunt   . Hypertension Paternal Uncle     Social History   Tobacco Use  . Smoking status: Current Every Day Smoker    Packs/day: 0.50    Years: 11.00    Pack years: 5.50    Types: Cigarettes  . Smokeless  tobacco: Never Used  Vaping Use  . Vaping Use: Former  Substance Use Topics  . Alcohol use: No  . Drug use: Not Currently    Frequency: 5.0 times per week    Types: Cocaine, "Crack" cocaine    Comment: pt states she last smoked crack 06/02/20    Home Medications Prior to Admission medications   Medication Sig Start Date End Date Taking? Authorizing Provider  ondansetron (ZOFRAN) 8 MG tablet Take 1 tablet (8 mg total) by mouth every 8 (eight) hours as needed for nausea. 11/02/20  Yes Nahara Dona, Barbara Cower, MD  amLODipine (NORVASC) 5 MG tablet Take 5 mg by mouth daily. 02/16/20   [provider]  buPROPion (WELLBUTRIN XL) 300 MG 24 hr tablet Take 1 tablet (300 mg total) by mouth daily. 06/11/20 07/11/20  Lauro Franklin, MD  omeprazole (PRILOSEC) 20 MG capsule Take 20 mg by mouth daily.    [provider]  QUEtiapine (SEROQUEL) 300 MG tablet Take 1 tablet (300 mg total) by mouth at  bedtime. 06/10/20 07/10/20  Lauro Franklin, MD    Allergies    Sulfa antibiotics, Bactrim [sulfamethoxazole-trimethoprim], Doxycycline, and Hydrocodone  Review of Systems   Review of Systems  All other systems reviewed and are negative.   Physical Exam Updated Vital Signs BP 130/69   Pulse 71   Temp 98 F (36.7 C)   Resp 17   Ht 4\' 11"  (1.499 m)   Wt 69.9 kg   SpO2 97%   BMI 31.10 kg/m   Physical Exam Vitals and nursing note reviewed.  Constitutional:      Appearance: She is well-developed.  HENT:     Head: Normocephalic and atraumatic.     Nose: No congestion or rhinorrhea.     Mouth/Throat:     Mouth: Mucous membranes are moist.     Pharynx: Oropharynx is clear.  Eyes:     Pupils: Pupils are equal, round, and reactive to light.  Cardiovascular:     Rate and Rhythm: Normal rate and regular rhythm.     Heart sounds: No murmur heard.   Pulmonary:     Effort: No respiratory distress.     Breath sounds: No stridor.  Abdominal:     General: There is no distension.  Musculoskeletal:        General: No swelling or tenderness. Normal range of motion.     Cervical back: Normal range of motion.  Skin:    General: Skin is warm and dry.  Neurological:     General: No focal deficit present.     Mental Status: She is alert.     ED Results / Procedures / Treatments   Labs (all labs ordered are listed, but only abnormal results are displayed) Labs Reviewed  COMPREHENSIVE METABOLIC PANEL - Abnormal; Notable for the following components:      Result Value   Potassium 3.3 (*)    Glucose, Bld 127 (*)    All other components within normal limits  URINALYSIS, ROUTINE W REFLEX MICROSCOPIC - Abnormal; Notable for the following components:   APPearance HAZY (*)    Hgb urine dipstick SMALL (*)    Ketones, ur 5 (*)    Leukocytes,Ua SMALL (*)    Bacteria, UA RARE (*)    Crystals PRESENT (*)    All other components within normal limits  CBC WITH  DIFFERENTIAL/PLATELET    EKG None  Radiology No results found.  Procedures Procedures   Medications Ordered in ED Medications  lactated ringers bolus 1,000 mL (0 mLs Intravenous Stopped 11/02/20 0543)  ondansetron (ZOFRAN) injection 4 mg (4 mg Intravenous Given 11/02/20 0119)  fentaNYL (SUBLIMAZE) injection 50 mcg (50 mcg Intravenous Given 11/02/20 0119)  diphenhydrAMINE (BENADRYL) capsule 25 mg (25 mg Oral Given 11/02/20 0205)  acetaminophen (TYLENOL) tablet 1,000 mg (1,000 mg Oral Given 11/02/20 1610)    ED Course  I have reviewed the triage vital signs and the nursing notes.  Pertinent labs & imaging results that were available during my care of the patient were reviewed by me and considered in my medical decision making (see chart for details).    MDM Rules/Calculators/A&P                          Likely viral gastroenteritis.  Patient's symptoms treated.  Patient without any nausea or vomiting for multiple hours prior to discharge.  Will DC on supportive care as well. Abdomen benign, no indication for emergent imaging.   Final Clinical Impression(s) / ED Diagnoses Final diagnoses:  Diarrhea, unspecified type  Non-intractable vomiting with nausea, unspecified vomiting type    Rx / DC Orders ED Discharge Orders         Ordered    ondansetron (ZOFRAN) 8 MG tablet  Every 8 hours PRN        11/02/20 0519           Shaneisha Burkel, Barbara Cower, MD 11/02/20 [phone]

## 2020-11-02 NOTE — ED Triage Notes (Signed)
Pt states that she wants detox from cocaine and help with her depression. Pt denies SI/HI

## 2020-11-03 ENCOUNTER — Emergency Department (HOSPITAL_COMMUNITY)
Admission: EM | Admit: 2020-11-03 | Discharge: 2020-11-03 | Discharge status: Against Medical Advice | Payer: Medicare Other | Source: Home / Self Care | Attending: Emergency Medicine | Admitting: Emergency Medicine

## 2020-11-03 DIAGNOSIS — F1410 Cocaine abuse, uncomplicated: Secondary | ICD-10-CM

## 2020-11-03 NOTE — BH Assessment (Addendum)
TTS called to complete consult . Per Nurse patient walked out of the emergency room with out being seen. Per Nurse removed consult for TTS .     ED Note :  Pt cussing, yelling and threatening multiple staff members. Pt escorted out of ED by security and [employer]. MD notified.  Olevia Bowens, RN
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
## 2020-11-03 NOTE — ED Notes (Signed)
Pt cussing, yelling and threatening multiple staff members. Pt escorted out of ED by security and [employer]. MD notified.

## 2021-04-03 ENCOUNTER — Encounter: Payer: Self-pay | Admitting: Adult Health

## 2021-04-03 ENCOUNTER — Other Ambulatory Visit (HOSPITAL_COMMUNITY)
Admission: RE | Admit: 2021-04-03 | Discharge: 2021-04-03 | Disposition: A | Discharge status: Home / Self Care | Payer: Medicare Other | Source: Ambulatory Visit | Attending: Adult Health | Admitting: Adult Health

## 2021-04-03 ENCOUNTER — Ambulatory Visit (INDEPENDENT_AMBULATORY_CARE_PROVIDER_SITE_OTHER): Payer: Medicare Other | Admitting: Adult Health

## 2021-04-03 ENCOUNTER — Other Ambulatory Visit: Payer: Self-pay

## 2021-04-03 VITALS — BP 113/72 | HR 75 | Ht 60.0 in | Wt 142.0 lb

## 2021-04-03 DIAGNOSIS — Z113 Encounter for screening for infections with a predominantly sexual mode of transmission: Secondary | ICD-10-CM | POA: Diagnosis present

## 2021-04-03 DIAGNOSIS — Z01419 Encounter for gynecological examination (general) (routine) without abnormal findings: Secondary | ICD-10-CM

## 2021-04-03 DIAGNOSIS — Z124 Encounter for screening for malignant neoplasm of cervix: Secondary | ICD-10-CM | POA: Diagnosis not present

## 2021-04-03 DIAGNOSIS — N926 Irregular menstruation, unspecified: Secondary | ICD-10-CM

## 2021-04-03 DIAGNOSIS — Z1151 Encounter for screening for human papillomavirus (HPV): Secondary | ICD-10-CM | POA: Diagnosis not present

## 2021-04-03 DIAGNOSIS — N941 Unspecified dyspareunia: Secondary | ICD-10-CM

## 2021-04-03 LAB — POCT URINE PREGNANCY: Preg Test, Ur: NEGATIVE

## 2021-04-03 NOTE — Progress Notes (Signed)
Patient ID: DAELYNN BLOWER, female   DOB: 09/12/1990, 30 y.o.   MRN: [id number] History of Present Illness:  Medina is a 30 year old white female,single, G2P2 in for a well woman gyn exam and pap. PCP is Elder Negus PA.  Current Medications, Allergies, Past Medical History, Past Surgical History, Family History and Social History were reviewed in [employer] record.     Review of Systems:  Patient denies any headaches, hearing loss, fatigue, blurred vision, shortness of breath, chest pain, abdominal pain, problems with bowel movements, urination. No joint pain or mood swings. She missed 3-4 periods and then had in September. Has pain with sex, seems positional.  Physical Exam:BP 113/72 (BP Location: Left Arm, Patient Position: Sitting, Cuff Size: Normal)   Pulse 75   Ht 5' (1.524 m)   Wt 142 lb (64.4 kg)   LMP 03/18/2021   BMI 27.73 kg/m  UPT is negative. General:  Well developed, well nourished, no acute distress Skin:  Warm and dry Neck:  Midline trachea, normal thyroid, good ROM, no lymphadenopathy Lungs; Clear to auscultation bilaterally Breast:  No dominant palpable mass, retraction, or nipple discharge Cardiovascular: Regular rate and rhythm Abdomen:  Soft, non tender, no hepatosplenomegaly Pelvic:  External genitalia is normal in appearance, no lesions.  The vagina is normal in appearance. Urethra has no lesions or masses. The cervix is bulbous.Pap with GC/CHL and HR HPV genotyping performed.  Uterus is felt to be normal size, shape, and contour.  No adnexal masses or tenderness noted.Bladder is non tender, no masses felt. Extremities/musculoskeletal:  No swelling or varicosities noted, no clubbing or cyanosis Psych:  No mood changes, alert and cooperative,seems happy AA is 0  Fall risk is low Depression screen [facility] 2/9 04/03/2021 06/04/2020 04/17/2016  Decreased Interest 0 3 0  Down, Depressed, Hopeless 0 3 0  PHQ - 2 Score 0 6 0  Altered sleeping  1 3 -  Tired, decreased energy 1 3 -  Change in appetite 1 0 -  Feeling bad or failure about yourself  0 3 -  Trouble concentrating 1 0 -  Moving slowly or fidgety/restless 0 1 -  Suicidal thoughts 0 2 -  PHQ-9 Score 4 18 -  Difficult doing work/chores - Very difficult -   She goes to [facility] GAD 7 : Generalized Anxiety Score 04/03/2021  Nervous, Anxious, on Edge 0  Control/stop worrying 0  Worry too much - different things 0  Trouble relaxing 0  Restless 0  Easily annoyed or irritable 0  Afraid - awful might happen 0  Total GAD 7 Score 0      Upstream - 04/03/21 1123       Pregnancy Intention Screening   Does the patient want to become pregnant in the next year? No    Does the patient's partner want to become pregnant in the next year? No    Would the patient like to discuss contraceptive options today? No      Contraception Wrap Up   Current Method Female Sterilization    End Method Female Sterilization    Contraception Counseling Provided No            Examination chaperoned by Faith Rogue LPN   Impression and Plan: 1. Missed periods UPT is negative  2. Routine cervical smear Pap sent   3. Screen for STD (sexually transmitted disease) GC/CHL on pap  4. Encounter for gynecological examination with Papanicolaou smear of cervix Pap sent Physical in  1 year Pap in 3 if normal Labs with PCP  5. Dyspareunia, female Change positions where he does not get as much penetration

## 2021-04-04 LAB — CYTOLOGY - PAP
Adequacy: ABSENT
Chlamydia: POSITIVE — AB
Comment: NEGATIVE
Comment: NEGATIVE
Comment: NORMAL
Diagnosis: NEGATIVE
High risk HPV: NEGATIVE
Neisseria Gonorrhea: NEGATIVE

## 2021-04-05 ENCOUNTER — Telehealth: Payer: Self-pay | Admitting: Adult Health

## 2021-04-05 ENCOUNTER — Encounter: Payer: Self-pay | Admitting: Adult Health

## 2021-04-05 DIAGNOSIS — A749 Chlamydial infection, unspecified: Secondary | ICD-10-CM

## 2021-04-05 HISTORY — DX: Chlamydial infection, unspecified: A74.9

## 2021-04-05 MED ORDER — AZITHROMYCIN 500 MG PO TABS: ORAL_TABLET | ORAL | 0 refills | Status: AC

## 2021-04-05 NOTE — Telephone Encounter (Signed)
Pt aware of +chlamydia and that rx sent in and no sex, and PCO in 4 weeks cn be self swab and that partner needs to be treated.

## 2021-04-05 NOTE — Telephone Encounter (Signed)
Left message to call me, if she calls pap +chlamydia rx sent for azithromycin 500 mg #2 2 po now and no sex, partner needs to be treated, POC in 4 weeks, [facility] sent

## 2021-10-07 ENCOUNTER — Emergency Department (HOSPITAL_COMMUNITY)
Admission: EM | Admit: 2021-10-07 | Discharge: 2021-10-07 | Disposition: A | Discharge status: Home / Self Care | Payer: Medicare Other | Attending: Emergency Medicine | Admitting: Emergency Medicine

## 2021-10-07 ENCOUNTER — Emergency Department (HOSPITAL_COMMUNITY): Payer: Medicare Other

## 2021-10-07 ENCOUNTER — Other Ambulatory Visit: Payer: Self-pay

## 2021-10-07 ENCOUNTER — Encounter (HOSPITAL_COMMUNITY): Payer: Self-pay

## 2021-10-07 DIAGNOSIS — R1010 Upper abdominal pain, unspecified: Secondary | ICD-10-CM | POA: Insufficient documentation

## 2021-10-07 DIAGNOSIS — R110 Nausea: Secondary | ICD-10-CM | POA: Diagnosis not present

## 2021-10-07 DIAGNOSIS — R350 Frequency of micturition: Secondary | ICD-10-CM | POA: Diagnosis not present

## 2021-10-07 DIAGNOSIS — I10 Essential (primary) hypertension: Secondary | ICD-10-CM | POA: Insufficient documentation

## 2021-10-07 DIAGNOSIS — R748 Abnormal levels of other serum enzymes: Secondary | ICD-10-CM | POA: Diagnosis not present

## 2021-10-07 DIAGNOSIS — R109 Unspecified abdominal pain: Secondary | ICD-10-CM

## 2021-10-07 DIAGNOSIS — Z79899 Other long term (current) drug therapy: Secondary | ICD-10-CM | POA: Diagnosis not present

## 2021-10-07 DIAGNOSIS — R1013 Epigastric pain: Secondary | ICD-10-CM | POA: Insufficient documentation

## 2021-10-07 DIAGNOSIS — N3289 Other specified disorders of bladder: Secondary | ICD-10-CM | POA: Diagnosis not present

## 2021-10-07 LAB — URINALYSIS, ROUTINE W REFLEX MICROSCOPIC
Bacteria, UA: NONE SEEN
Bilirubin Urine: NEGATIVE
Glucose, UA: NEGATIVE mg/dL
Hgb urine dipstick: NEGATIVE
Ketones, ur: NEGATIVE mg/dL
Nitrite: NEGATIVE
Protein, ur: 30 mg/dL — AB
Specific Gravity, Urine: 1.018 (ref 1.005–1.030)
pH: 6 (ref 5.0–8.0)

## 2021-10-07 LAB — COMPREHENSIVE METABOLIC PANEL
ALT: 13 U/L (ref 0–44)
AST: 15 U/L (ref 15–41)
Albumin: 4.8 g/dL (ref 3.5–5.0)
Alkaline Phosphatase: 53 U/L (ref 38–126)
Anion gap: 9 (ref 5–15)
BUN: 10 mg/dL (ref 6–20)
CO2: 24 mmol/L (ref 22–32)
Calcium: 9.7 mg/dL (ref 8.9–10.3)
Chloride: 103 mmol/L (ref 98–111)
Creatinine, Ser: 0.8 mg/dL (ref 0.44–1.00)
GFR, Estimated: >60 mL/min (ref >60)
Glucose, Bld: 100 mg/dL — ABNORMAL HIGH (ref 70–99)
Potassium: 3.8 mmol/L (ref 3.5–5.1)
Sodium: 136 mmol/L (ref 135–145)
Total Bilirubin: 0.8 mg/dL (ref 0.3–1.2)
Total Protein: 8.1 g/dL (ref 6.5–8.1)

## 2021-10-07 LAB — CBC WITH DIFFERENTIAL/PLATELET
Abs Immature Granulocytes: 0.02 10*3/uL (ref 0.00–0.07)
Basophils Absolute: 0.1 10*3/uL (ref 0.0–0.1)
Basophils Relative: 1 %
Eosinophils Absolute: 0.3 10*3/uL (ref 0.0–0.5)
Eosinophils Relative: 4 %
HCT: 45.6 % (ref 36.0–46.0)
Hemoglobin: 15.2 g/dL — ABNORMAL HIGH (ref 12.0–15.0)
Immature Granulocytes: 0 %
Lymphocytes Relative: 25 %
Lymphs Abs: 2.1 10*3/uL (ref 0.7–4.0)
MCH: 31.7 pg (ref 26.0–34.0)
MCHC: 33.3 g/dL (ref 30.0–36.0)
MCV: 95.2 fL (ref 80.0–100.0)
Monocytes Absolute: 0.7 10*3/uL (ref 0.1–1.0)
Monocytes Relative: 8 %
Neutro Abs: 5.5 10*3/uL (ref 1.7–7.7)
Neutrophils Relative %: 62 %
Platelets: 286 10*3/uL (ref 150–400)
RBC: 4.79 MIL/uL (ref 3.87–5.11)
RDW: 13.4 % (ref 11.5–15.5)
WBC: 8.6 10*3/uL (ref 4.0–10.5)
nRBC: 0 % (ref 0.0–0.2)

## 2021-10-07 LAB — PREGNANCY, URINE: Preg Test, Ur: NEGATIVE

## 2021-10-07 LAB — LIPASE, BLOOD: Lipase: 22 U/L (ref 11–51)

## 2021-10-07 MED ORDER — ONDANSETRON HCL 4 MG PO TABS: 4.0000 mg | ORAL_TABLET | Freq: Four times a day (QID) | ORAL | 0 refills | Status: AC

## 2021-10-07 MED ORDER — ALUM & MAG HYDROXIDE-SIMETH 200-200-20 MG/5ML PO SUSP
30.0000 mL | Freq: Once | ORAL | Status: AC
  Administered 2021-10-07: 30 mL via ORAL
  Filled 2021-10-07: qty 30

## 2021-10-07 MED ORDER — PANTOPRAZOLE SODIUM 40 MG IV SOLR
40.0000 mg | Freq: Once | INTRAVENOUS | Status: AC
  Administered 2021-10-07: 40 mg via INTRAVENOUS
  Filled 2021-10-07: qty 10

## 2021-10-07 MED ORDER — KETOROLAC TROMETHAMINE 30 MG/ML IJ SOLN
15.0000 mg | Freq: Once | INTRAMUSCULAR | Status: AC
Start: 2021-10-07 — End: 2021-10-07
  Administered 2021-10-07: 15 mg via INTRAVENOUS
  Filled 2021-10-07: qty 1

## 2021-10-07 MED ORDER — ONDANSETRON HCL 4 MG/2ML IJ SOLN
4.0000 mg | Freq: Once | INTRAMUSCULAR | Status: AC
  Administered 2021-10-07: 4 mg via INTRAVENOUS
  Filled 2021-10-07: qty 2

## 2021-10-07 MED ORDER — TAMSULOSIN HCL 0.4 MG PO CAPS: 0.4000 mg | ORAL_CAPSULE | Freq: Every day | ORAL | 0 refills | Status: AC

## 2021-10-07 MED ORDER — IBUPROFEN 600 MG PO TABS: 600.0000 mg | ORAL_TABLET | Freq: Four times a day (QID) | ORAL | 0 refills | Status: AC | PRN

## 2021-10-07 MED ORDER — SODIUM CHLORIDE 0.9 % IV BOLUS
1000.0000 mL | Freq: Once | INTRAVENOUS | Status: AC
  Administered 2021-10-07: 1000 mL via INTRAVENOUS

## 2021-10-07 NOTE — Discharge Instructions (Addendum)
You are seen in the emergency department today for right flank pain.  You do have a small kidney stone that should pass on its own.  I provided you some medications to help with pain and spasm while the kidney stones passing.  Please return to the emergency department if you began to have fevers or unable to urinate. ?

## 2021-10-07 NOTE — ED Provider Notes (Signed)
?[facility] ?Provider Note ? ? ?CSN: [id number] ?Arrival date & time: 10/07/21  1142 ? ?  ? ?History ? ?Chief Complaint  ?Patient presents with  ? Abdominal Pain  ? ? ?Connie [name] is a 31 y.o. female.  With past medical history of bipolar, hypertension, kidney stones, GERD who presents emergency department with right flank pain. ? ?Patient states that she has had right flank pain for about 3 days now.  She states that she is having pain with laying on her back on the right side.  She endorses having bladder spasms with urinating as well as increased urinary frequency.  She has had multiple renal stones previously and states that this feels similar.  Additionally, she states that she has had some upper abdominal pain in epigastric region with nausea with eating.  She denies any fevers, hematuria, dysuria, diarrhea, chest pain or shortness of breath. ? ? ?Abdominal Pain ?Associated symptoms: nausea   ?Associated symptoms: no chest pain, no dysuria, no fever, no hematuria, no shortness of breath, no vaginal discharge and no vomiting   ? ?  ? ?Home Medications ?Prior to Admission medications   ?Medication Sig Start Date End Date Taking? Authorizing Provider  ?amLODipine (NORVASC) 5 MG tablet Take 5 mg by mouth daily. 02/16/20   [provider]  ?azithromycin (ZITHROMAX) 500 MG tablet Take 2 po now 04/05/21   Derrek Monaco A, NP  ?buPROPion (WELLBUTRIN XL) 300 MG 24 hr tablet Take 1 tablet (300 mg total) by mouth daily. 06/11/20 07/11/20  Briant Cedar, MD  ?omeprazole (PRILOSEC) 20 MG capsule Take 20 mg by mouth daily.    [provider]  ?ondansetron (ZOFRAN) 8 MG tablet Take 1 tablet (8 mg total) by mouth every 8 (eight) hours as needed for nausea. ?Patient not taking: Reported on 04/03/2021 11/02/20   Mesner, Corene Cornea, MD  ?QUEtiapine (SEROQUEL) 300 MG tablet Take 1 tablet (300 mg total) by mouth at bedtime. 06/10/20 07/10/20  Briant Cedar, MD  ?   ? ?Allergies     ?Sulfa antibiotics, Bactrim [sulfamethoxazole-trimethoprim], Doxycycline, and Hydrocodone   ? ?Review of Systems   ?Review of Systems  ?Constitutional:  Negative for fever.  ?Respiratory:  Negative for shortness of breath.   ?Cardiovascular:  Negative for chest pain.  ?Gastrointestinal:  Positive for abdominal pain and nausea. Negative for vomiting.  ?Genitourinary:  Positive for flank pain and frequency. Negative for dysuria, hematuria and vaginal discharge.  ?All other systems reviewed and are negative. ? ?Physical Exam ?Updated Vital Signs ?BP (!) 145/92 (BP Location: Right Arm)   Pulse 90   Temp 98.1 ?F (36.7 ?C) (Oral)   Resp 20   Ht 4\' 11"  (1.499 m)   Wt 70.8 kg   SpO2 99%   BMI 31.51 kg/m?  ?Physical Exam ?Vitals and nursing note reviewed.  ?Constitutional:   ?   General: She is not in acute distress. ?   Appearance: Normal appearance. She is well-developed. She is not ill-appearing or toxic-appearing.  ?HENT:  ?   Head: Normocephalic and atraumatic.  ?   Nose: Nose normal.  ?   Mouth/Throat:  ?   Mouth: Mucous membranes are moist.  ?   Pharynx: Oropharynx is clear.  ?Eyes:  ?   General: No scleral icterus. ?   Extraocular Movements: Extraocular movements intact.  ?   Pupils: Pupils are equal, round, and reactive to light.  ?Cardiovascular:  ?   Rate and Rhythm: Normal rate and regular  rhythm.  ?   Heart sounds: Normal heart sounds. No murmur heard. ?Pulmonary:  ?   Effort: Pulmonary effort is normal. No respiratory distress.  ?   Breath sounds: Normal breath sounds.  ?Abdominal:  ?   General: Abdomen is protuberant. Bowel sounds are normal. There is no distension.  ?   Palpations: Abdomen is soft.  ?   Tenderness: There is abdominal tenderness in the epigastric area. There is right CVA tenderness. There is no left CVA tenderness, guarding or rebound. Negative signs include Murphy's sign and McBurney's sign.  ?   Hernia: No hernia is present.  ?Musculoskeletal:     ?   General: Normal range of motion.   ?   Cervical back: Neck supple.  ?Skin: ?   General: Skin is warm and dry.  ?   Capillary Refill: Capillary refill takes less than 2 seconds.  ?Neurological:  ?   General: No focal deficit present.  ?   Mental Status: She is alert and oriented to person, place, and time. Mental status is at baseline.  ?Psychiatric:     ?   Mood and Affect: Mood normal.     ?   Behavior: Behavior normal.     ?   Thought Content: Thought content normal.     ?   Judgment: Judgment normal.  ? ? ?ED Results / Procedures / Treatments   ?Labs ?(all labs ordered are listed, but only abnormal results are displayed) ?Labs Reviewed  ?URINALYSIS, ROUTINE W REFLEX MICROSCOPIC - Abnormal; Notable for the following components:  ?    Result Value  ? APPearance CLOUDY (*)   ? Protein, ur 30 (*)   ? Leukocytes,Ua LARGE (*)   ? All other components within normal limits  ?CBC WITH DIFFERENTIAL/PLATELET - Abnormal; Notable for the following components:  ? Hemoglobin 15.2 (*)   ? All other components within normal limits  ?COMPREHENSIVE METABOLIC PANEL - Abnormal; Notable for the following components:  ? Glucose, Bld 100 (*)   ? All other components within normal limits  ?PREGNANCY, URINE  ?LIPASE, BLOOD  ? ?EKG ?None ? ?Radiology ?CT Renal Stone Study ? ?Result Date: 10/07/2021 ?CLINICAL DATA:  Flank pain.  Nausea. EXAM: CT ABDOMEN AND PELVIS WITHOUT CONTRAST TECHNIQUE: Multidetector CT imaging of the abdomen and pelvis was performed following the standard protocol without IV contrast. RADIATION DOSE REDUCTION: This exam was performed according to the departmental dose-optimization program which includes automated exposure control, adjustment of the mA and/or kV according to patient size and/or use of iterative reconstruction technique. COMPARISON:  08/31/2016 FINDINGS: Lower chest: Unremarkable. Hepatobiliary: No suspicious focal abnormality in the liver on this study without intravenous contrast. There is no evidence for gallstones, gallbladder wall  thickening, or pericholecystic fluid. No intrahepatic or extrahepatic biliary dilation. Pancreas: No focal mass lesion. No dilatation of the main duct. No intraparenchymal cyst. No peripancreatic edema. Spleen: No splenomegaly. No focal mass lesion. Adrenals/Urinary Tract: No adrenal nodule or mass. 2 mm nonobstructing stone noted lower pole right kidney. Punctate nonobstructing stones are seen in the lower pole left kidney (images 29 and 31 of series 2). No ureteral or bladder stones. No secondary changes in either kidney or ureter. Stomach/Bowel: Stomach is unremarkable. No gastric wall thickening. No evidence of outlet obstruction. Duodenum is normally positioned as is the ligament of Treitz. No small bowel wall thickening. No small bowel dilatation. The terminal ileum is normal. The appendix is normal. No gross colonic mass. No colonic wall thickening. Vascular/Lymphatic:  No abdominal aortic aneurysm. No abdominal lymphadenopathy. No pelvic sidewall lymphadenopathy. Reproductive: The uterus is unremarkable.  There is no adnexal mass. Other: No intraperitoneal free fluid. Musculoskeletal: No worrisome lytic or sclerotic osseous abnormality. IMPRESSION: Tiny bilateral nonobstructing renal stones. No ureteral or bladder stones. No secondary changes in either kidney or ureter. Electronically Signed   By: Misty Stanley M.D.   On: 10/07/2021 14:08   ? ?Procedures ?Procedures  ? ? ?Medications Ordered in ED ?Medications  ?ondansetron (ZOFRAN) injection 4 mg (4 mg Intravenous Given 10/07/21 1348)  ?sodium chloride 0.9 % bolus 1,000 mL (0 mLs Intravenous Stopped 10/07/21 1535)  ?ketorolac (TORADOL) 30 MG/ML injection 15 mg (15 mg Intravenous Given 10/07/21 1347)  ?alum & mag hydroxide-simeth (MAALOX/MYLANTA) 200-200-20 MG/5ML suspension 30 mL (30 mLs Oral Given 10/07/21 1535)  ?pantoprazole (PROTONIX) injection 40 mg (40 mg Intravenous Given 10/07/21 1535)  ? ? ?ED Course/ Medical Decision Making/ A&P ?  ?                         ?Medical Decision Making ?Amount and/or Complexity of Data Reviewed ?Labs: ordered. ?Radiology: ordered. ? ?Risk ?OTC drugs. ?Prescription drug management. ? ?This patient presents to the ED for concern of fl

## 2021-10-07 NOTE — ED Triage Notes (Signed)
Abdominal pain with back pain and nausea for 3 days.  ?

## 2022-04-23 ENCOUNTER — Encounter (HOSPITAL_BASED_OUTPATIENT_CLINIC_OR_DEPARTMENT_OTHER): Payer: Self-pay

## 2022-04-23 ENCOUNTER — Ambulatory Visit (HOSPITAL_BASED_OUTPATIENT_CLINIC_OR_DEPARTMENT_OTHER)
Admission: RE | Admit: 2022-04-23 | Discharge: 2022-04-23 | Disposition: A | Discharge status: Home / Self Care | Payer: Medicare Other | Source: Ambulatory Visit | Attending: Emergency Medicine | Admitting: Emergency Medicine

## 2022-04-23 ENCOUNTER — Other Ambulatory Visit: Payer: Self-pay

## 2022-04-23 ENCOUNTER — Emergency Department (HOSPITAL_BASED_OUTPATIENT_CLINIC_OR_DEPARTMENT_OTHER)
Admission: EM | Admit: 2022-04-23 | Discharge: 2022-04-23 | Disposition: A | Discharge status: Home / Self Care | Payer: Medicare Other | Attending: Emergency Medicine | Admitting: Emergency Medicine

## 2022-04-23 ENCOUNTER — Encounter (HOSPITAL_BASED_OUTPATIENT_CLINIC_OR_DEPARTMENT_OTHER): Payer: Self-pay | Admitting: Emergency Medicine

## 2022-04-23 DIAGNOSIS — R109 Unspecified abdominal pain: Secondary | ICD-10-CM | POA: Diagnosis not present

## 2022-04-23 DIAGNOSIS — Z79899 Other long term (current) drug therapy: Secondary | ICD-10-CM | POA: Diagnosis not present

## 2022-04-23 DIAGNOSIS — R112 Nausea with vomiting, unspecified: Secondary | ICD-10-CM | POA: Insufficient documentation

## 2022-04-23 LAB — CBC WITH DIFFERENTIAL/PLATELET
Abs Immature Granulocytes: 0.01 10*3/uL (ref 0.00–0.07)
Basophils Absolute: 0.1 10*3/uL (ref 0.0–0.1)
Basophils Relative: 1 %
Eosinophils Absolute: 0.1 10*3/uL (ref 0.0–0.5)
Eosinophils Relative: 1 %
HCT: 42.1 % (ref 36.0–46.0)
Hemoglobin: 14.5 g/dL (ref 12.0–15.0)
Immature Granulocytes: 0 %
Lymphocytes Relative: 30 %
Lymphs Abs: 2.9 10*3/uL (ref 0.7–4.0)
MCH: 31.3 pg (ref 26.0–34.0)
MCHC: 34.4 g/dL (ref 30.0–36.0)
MCV: 90.7 fL (ref 80.0–100.0)
Monocytes Absolute: 0.8 10*3/uL (ref 0.1–1.0)
Monocytes Relative: 8 %
Neutro Abs: 5.6 10*3/uL (ref 1.7–7.7)
Neutrophils Relative %: 60 %
Platelets: 248 10*3/uL (ref 150–400)
RBC: 4.64 MIL/uL (ref 3.87–5.11)
RDW: 13.3 % (ref 11.5–15.5)
WBC: 9.4 10*3/uL (ref 4.0–10.5)
nRBC: 0 % (ref 0.0–0.2)

## 2022-04-23 LAB — COMPREHENSIVE METABOLIC PANEL
ALT: 7 U/L (ref 0–44)
AST: 14 U/L — ABNORMAL LOW (ref 15–41)
Albumin: 5.1 g/dL — ABNORMAL HIGH (ref 3.5–5.0)
Alkaline Phosphatase: 54 U/L (ref 38–126)
Anion gap: 12 (ref 5–15)
BUN: 9 mg/dL (ref 6–20)
CO2: 22 mmol/L (ref 22–32)
Calcium: 10.1 mg/dL (ref 8.9–10.3)
Chloride: 101 mmol/L (ref 98–111)
Creatinine, Ser: 0.91 mg/dL (ref 0.44–1.00)
GFR, Estimated: >60 mL/min (ref >60)
Glucose, Bld: 108 mg/dL — ABNORMAL HIGH (ref 70–99)
Potassium: 3.7 mmol/L (ref 3.5–5.1)
Sodium: 135 mmol/L (ref 135–145)
Total Bilirubin: 0.7 mg/dL (ref 0.3–1.2)
Total Protein: 7.3 g/dL (ref 6.5–8.1)

## 2022-04-23 LAB — LIPASE, BLOOD: Lipase: <10 U/L — ABNORMAL LOW (ref 11–51)

## 2022-04-23 LAB — PROTIME-INR
INR: 1.1 (ref 0.8–1.2)
Prothrombin Time: 13.9 seconds (ref 11.4–15.2)

## 2022-04-23 LAB — HCG, SERUM, QUALITATIVE: Preg, Serum: NEGATIVE

## 2022-04-23 MED ORDER — DROPERIDOL 2.5 MG/ML IJ SOLN
1.2500 mg | Freq: Once | INTRAMUSCULAR | Status: AC
  Administered 2022-04-23: 1.25 mg via INTRAVENOUS
  Filled 2022-04-23: qty 2

## 2022-04-23 MED ORDER — PROMETHAZINE HCL 25 MG RE SUPP: 25.0000 mg | Freq: Four times a day (QID) | RECTAL | 0 refills | Status: AC | PRN

## 2022-04-23 MED ORDER — ONDANSETRON 4 MG PO TBDP: ORAL_TABLET | ORAL | 0 refills | Status: AC

## 2022-04-23 MED ORDER — DIPHENHYDRAMINE HCL 50 MG/ML IJ SOLN
25.0000 mg | Freq: Once | INTRAMUSCULAR | Status: AC
Start: 2022-04-23 — End: 2022-04-23
  Administered 2022-04-23: 25 mg via INTRAVENOUS
  Filled 2022-04-23: qty 1

## 2022-04-23 MED ORDER — SODIUM CHLORIDE 0.9 % IV BOLUS
1000.0000 mL | Freq: Once | INTRAVENOUS | Status: AC
Start: 2022-04-23 — End: 2022-04-23
  Administered 2022-04-23: 1000 mL via INTRAVENOUS

## 2022-04-23 MED ORDER — IOHEXOL 300 MG/ML  SOLN: 100.0000 mL | Freq: Once | INTRAMUSCULAR | Status: DC | PRN

## 2022-04-23 MED ORDER — MORPHINE SULFATE (PF) 4 MG/ML IV SOLN
4.0000 mg | Freq: Once | INTRAVENOUS | Status: AC
Start: 2022-04-23 — End: 2022-04-23
  Administered 2022-04-23: 4 mg via INTRAVENOUS
  Filled 2022-04-23: qty 1

## 2022-04-23 NOTE — ED Notes (Signed)
Pt requested to be discharged before CT. Dr Tyrone Nine verbalized d/c of CT and confirmed he feels comfortable d/c pt at this time. Pt states her pain and nausea had gotten much relief from meds. Pt in no obvious distress, no longer restless or tearful. D/c instructions and prescriptions reviewed and explained, pt verbalized understanding and had no further questions on d/c. Pt was able to dress self and ambulate without need of assistance. Pt caox4 and ambulatory on d/c.

## 2022-04-23 NOTE — Discharge Instructions (Signed)
Follow-up with your doctor in the office. Try pepcid or tagamet up to twice a day.  Try to avoid things that may make this worse, most commonly these are spicy foods tomato based products fatty foods chocolate and peppermint.  Alcohol and tobacco can also make this worse.  Return to the emergency department for sudden worsening pain fever or inability to eat or drink.

## 2022-04-23 NOTE — ED Provider Notes (Signed)
[facility] EMERGENCY DEPT Provider Note   CSN: [id number] Arrival date & time: 04/23/22  0810     History  Chief Complaint  Patient presents with   Emesis    Vomiting blood     Connie [name] is a 31 y.o. female.  31 yo F with a chief complaints of intractable nausea and vomiting.  This has been going on for about 4 days.  Tells me that she is throwing up blood and having blood come out of her bottom.  She has a history of diverticulitis and this feels somewhat similar.  Diffuse abdominal discomfort.  No fevers.  No known sick contacts.   Emesis      Home Medications Prior to Admission medications   Medication Sig Start Date End Date Taking? Authorizing Provider  ondansetron (ZOFRAN-ODT) 4 MG disintegrating tablet 4mg  ODT q4 hours prn nausea/vomit 04/23/22  Yes Deno Etienne, DO  promethazine (PHENERGAN) 25 MG suppository Place 1 suppository (25 mg total) rectally every 6 (six) hours as needed for nausea or vomiting. 04/23/22  Yes Deno Etienne, DO  amLODipine (NORVASC) 5 MG tablet Take 5 mg by mouth daily. 02/16/20   [provider]  azithromycin (ZITHROMAX) 500 MG tablet Take 2 po now 04/05/21   Derrek Monaco A, NP  buPROPion (WELLBUTRIN XL) 300 MG 24 hr tablet Take 1 tablet (300 mg total) by mouth daily. 06/11/20 07/11/20  Briant Cedar, MD  ibuprofen (ADVIL) 600 MG tablet Take 1 tablet (600 mg total) by mouth every 6 (six) hours as needed. 10/07/21   Mickie Hillier, PA-C  lisinopril (ZESTRIL) 10 MG tablet Take 10 mg by mouth daily. 02/08/22   [provider]  omeprazole (PRILOSEC) 20 MG capsule Take 20 mg by mouth daily.    [provider]  omeprazole (PRILOSEC) 40 MG capsule Take 40 mg by mouth every morning. 02/08/22   [provider]  ondansetron (ZOFRAN) 4 MG tablet Take 1 tablet (4 mg total) by mouth every 6 (six) hours. 10/07/21   Mickie Hillier, PA-C  QUEtiapine (SEROQUEL) 300 MG tablet Take 1 tablet (300 mg  total) by mouth at bedtime. 06/10/20 07/10/20  Briant Cedar, MD      Allergies    Sulfa antibiotics, Bactrim [sulfamethoxazole-trimethoprim], Doxycycline, and Hydrocodone    Review of Systems   Review of Systems  Gastrointestinal:  Positive for vomiting.    Physical Exam Updated Vital Signs BP (!) 131/92   Pulse 97   Temp 98.8 F (37.1 C) (Oral)   Resp (!) 30   Ht 4\' 11"  (1.499 m)   Wt 66.7 kg   LMP 04/23/2022 (Approximate)   SpO2 97%   BMI 29.69 kg/m  Physical Exam Vitals and nursing note reviewed.  Constitutional:      General: She is not in acute distress.    Appearance: She is well-developed. She is not diaphoretic.     Comments: Patient is hyperventilating and is rocking back and forth in the bed.  HENT:     Head: Normocephalic and atraumatic.  Eyes:     Pupils: Pupils are equal, round, and reactive to light.  Cardiovascular:     Rate and Rhythm: Normal rate and regular rhythm.     Heart sounds: No murmur heard.    No friction rub. No gallop.  Pulmonary:     Effort: Pulmonary effort is normal.     Breath sounds: No wheezing or rales.  Abdominal:     General: There is  no distension.     Palpations: Abdomen is soft.     Tenderness: There is no abdominal tenderness.     Comments: Soft not obviously tender abdomen  Musculoskeletal:        General: No tenderness.     Cervical back: Normal range of motion and neck supple.  Skin:    General: Skin is warm and dry.  Neurological:     Mental Status: She is alert and oriented to person, place, and time.  Psychiatric:        Behavior: Behavior normal.     ED Results / Procedures / Treatments   Labs (all labs ordered are listed, but only abnormal results are displayed) Labs Reviewed  COMPREHENSIVE METABOLIC PANEL - Abnormal; Notable for the following components:      Result Value   Glucose, Bld 108 (*)    Albumin 5.1 (*)    AST 14 (*)    All other components within normal limits  LIPASE, BLOOD -  Abnormal; Notable for the following components:   Lipase <10 (*)    All other components within normal limits  CBC WITH DIFFERENTIAL/PLATELET  HCG, SERUM, QUALITATIVE  PROTIME-INR  URINALYSIS, ROUTINE W REFLEX MICROSCOPIC    EKG None  Radiology No results found.  Procedures Procedures    Medications Ordered in ED Medications  sodium chloride 0.9 % bolus 1,000 mL (1,000 mLs Intravenous New Bag/Given 04/23/22 0846)  droperidol (INAPSINE) 2.5 MG/ML injection 1.25 mg (1.25 mg Intravenous Given 04/23/22 0925)  diphenhydrAMINE (BENADRYL) injection 25 mg (25 mg Intravenous Given 04/23/22 0846)  morphine (PF) 4 MG/ML injection 4 mg (4 mg Intravenous Given 04/23/22 0847)    ED Course/ Medical Decision Making/ A&P                           Medical Decision Making Amount and/or Complexity of Data Reviewed Labs: ordered. Radiology: ordered.  Risk Prescription drug management.   31 yo F with a significant past medical history of bipolar depression status post electroconvulsive therapy, cocaine abuse.  Here with a chief complaint of intractable nausea and vomiting.  Reporting blood in both her emesis in her stool.  We will obtain a laboratory evaluation to assess for new liver disease, including INR.  Treat pain and nausea.  Reassess.  Patient's lab work is resulted without leukocytosis no anemia INR is normal LFTs unremarkable lipase negative.  Patient still tells me that she is in intractable terrible discomfort though was resting comfortably on reassessment.    I had discussed obtaining CT imaging with the patient.  Initially she agreed and then on reassessment the patient stated she would rather go home at this time.  Was feeling better and would like to follow-up with her doctor in the office.  9:44 AM:  I have discussed the diagnosis/risks/treatment options with the patient.  Evaluation and diagnostic testing in the emergency department does not suggest an emergent condition  requiring admission or immediate intervention beyond what has been performed at this time.  They will follow up with PCP. We also discussed returning to the ED immediately if new or worsening sx occur. We discussed the sx which are most concerning (e.g., sudden worsening pain, fever, inability to tolerate by mouth) that necessitate immediate return. Medications administered to the patient during their visit and any new prescriptions provided to the patient are listed below.  Medications given during this visit Medications  sodium chloride 0.9 % bolus 1,000 mL (  1,000 mLs Intravenous New Bag/Given 04/23/22 0846)  droperidol (INAPSINE) 2.5 MG/ML injection 1.25 mg (1.25 mg Intravenous Given 04/23/22 0925)  diphenhydrAMINE (BENADRYL) injection 25 mg (25 mg Intravenous Given 04/23/22 0846)  morphine (PF) 4 MG/ML injection 4 mg (4 mg Intravenous Given 04/23/22 0847)     The patient appears reasonably screen and/or stabilized for discharge and I doubt any other medical condition or other [facility] requiring further screening, evaluation, or treatment in the ED at this time prior to discharge.          Final Clinical Impression(s) / ED Diagnoses Final diagnoses:  Nausea and vomiting in adult    Rx / DC Orders ED Discharge Orders          Ordered    ondansetron (ZOFRAN-ODT) 4 MG disintegrating tablet        04/23/22 0943    promethazine (PHENERGAN) 25 MG suppository  Every 6 hours PRN        04/23/22 [place], [place], DO 04/23/22 [phone]

## 2022-04-23 NOTE — ED Triage Notes (Signed)
Pt via pov from home with hematemesis x 4 days. Pt states she has been unable to hold down anything, including water. Pt reports bloody stool ("pouring out blood") as well. Pt alert & oriented, moaning and crying during triage.

## 2022-06-19 ENCOUNTER — Emergency Department (HOSPITAL_COMMUNITY): Payer: Medicare Other

## 2022-06-19 ENCOUNTER — Emergency Department (HOSPITAL_COMMUNITY)
Admission: EM | Admit: 2022-06-19 | Discharge: 2022-06-19 | Disposition: A | Discharge status: Home / Self Care | Payer: Medicare Other | Attending: Emergency Medicine | Admitting: Emergency Medicine

## 2022-06-19 DIAGNOSIS — Z79899 Other long term (current) drug therapy: Secondary | ICD-10-CM | POA: Diagnosis not present

## 2022-06-19 DIAGNOSIS — S0990XA Unspecified injury of head, initial encounter: Secondary | ICD-10-CM | POA: Diagnosis present

## 2022-06-19 DIAGNOSIS — S0001XA Abrasion of scalp, initial encounter: Secondary | ICD-10-CM | POA: Insufficient documentation

## 2022-06-19 DIAGNOSIS — F1990 Other psychoactive substance use, unspecified, uncomplicated: Secondary | ICD-10-CM

## 2022-06-19 DIAGNOSIS — I10 Essential (primary) hypertension: Secondary | ICD-10-CM | POA: Insufficient documentation

## 2022-06-19 DIAGNOSIS — F1920 Other psychoactive substance dependence, uncomplicated: Secondary | ICD-10-CM | POA: Diagnosis not present

## 2022-06-19 LAB — RAPID URINE DRUG SCREEN, HOSP PERFORMED
Amphetamines: NOT DETECTED
Barbiturates: NOT DETECTED
Benzodiazepines: NOT DETECTED
Cocaine: POSITIVE — AB
Opiates: NOT DETECTED
Tetrahydrocannabinol: POSITIVE — AB

## 2022-06-19 LAB — URINALYSIS, ROUTINE W REFLEX MICROSCOPIC
Bacteria, UA: NONE SEEN
Bilirubin Urine: NEGATIVE
Glucose, UA: NEGATIVE mg/dL
Hgb urine dipstick: NEGATIVE
Ketones, ur: 20 mg/dL — AB
Leukocytes,Ua: NEGATIVE
Nitrite: NEGATIVE
Protein, ur: 30 mg/dL — AB
Specific Gravity, Urine: 1.014 (ref 1.005–1.030)
pH: 7 (ref 5.0–8.0)

## 2022-06-19 LAB — POC URINE PREG, ED: Preg Test, Ur: NEGATIVE
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
## 2022-06-19 NOTE — Discharge Instructions (Signed)
Your CT today is negative for any acute intracranial injuries.  I have provided you with instructions regarding minor head injuries.  Rest, avoid any activities where you may risk reinjury to your head, plan to see your primary provider in a week if your headache symptoms are not resolved.  Recommend Tylenol or ibuprofen for headache pain relief.
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
## 2024-02-27 ENCOUNTER — Other Ambulatory Visit (HOSPITAL_COMMUNITY): Payer: Self-pay | Admitting: Physician Assistant

## 2024-02-27 DIAGNOSIS — R42 Dizziness and giddiness: Secondary | ICD-10-CM
